# Patient Record
Sex: Female | Born: 1955
Health system: Southern US, Community
[De-identification: ages and names within clinical notes are randomized; demographics above are authoritative.]

## PROBLEM LIST (undated history)

## (undated) DIAGNOSIS — G4730 Sleep apnea, unspecified: Secondary | ICD-10-CM

## (undated) DIAGNOSIS — H269 Unspecified cataract: Secondary | ICD-10-CM

## (undated) DIAGNOSIS — E079 Disorder of thyroid, unspecified: Secondary | ICD-10-CM

## (undated) DIAGNOSIS — T7840XA Allergy, unspecified, initial encounter: Secondary | ICD-10-CM

## (undated) DIAGNOSIS — E119 Type 2 diabetes mellitus without complications: Secondary | ICD-10-CM

## (undated) DIAGNOSIS — K219 Gastro-esophageal reflux disease without esophagitis: Secondary | ICD-10-CM

## (undated) DIAGNOSIS — E785 Hyperlipidemia, unspecified: Secondary | ICD-10-CM

## (undated) DIAGNOSIS — I10 Essential (primary) hypertension: Secondary | ICD-10-CM

## (undated) DIAGNOSIS — M1990 Unspecified osteoarthritis, unspecified site: Secondary | ICD-10-CM

## (undated) HISTORY — DX: Hyperlipidemia, unspecified: E78.5

## (undated) HISTORY — PX: KNEE SURGERY: SHX244

## (undated) HISTORY — DX: Disorder of thyroid, unspecified: E07.9

## (undated) HISTORY — PX: TUBAL LIGATION: SHX77

## (undated) HISTORY — DX: Sleep apnea, unspecified: G47.30

## (undated) HISTORY — DX: Essential (primary) hypertension: I10

## (undated) HISTORY — DX: Allergy, unspecified, initial encounter: T78.40XA

## (undated) HISTORY — PX: CARPAL TUNNEL RELEASE: SHX101

## (undated) HISTORY — DX: Type 2 diabetes mellitus without complications: E11.9

## (undated) HISTORY — PX: EYE SURGERY: SHX253

## (undated) HISTORY — DX: Gastro-esophageal reflux disease without esophagitis: K21.9

## (undated) HISTORY — PX: BREAST SURGERY: SHX581

## (undated) HISTORY — DX: Unspecified cataract: H26.9

## (undated) HISTORY — DX: Unspecified osteoarthritis, unspecified site: M19.90

---

## 2012-09-29 ENCOUNTER — Ambulatory Visit (INDEPENDENT_AMBULATORY_CARE_PROVIDER_SITE_OTHER): Payer: BC Managed Care – PPO | Admitting: Family Medicine

## 2012-09-29 VITALS — BP 124/74 | HR 78 | Temp 97.8°F | Resp 18 | Ht 64.0 in | Wt 234.0 lb

## 2012-09-29 DIAGNOSIS — N926 Irregular menstruation, unspecified: Secondary | ICD-10-CM

## 2012-09-29 DIAGNOSIS — K219 Gastro-esophageal reflux disease without esophagitis: Secondary | ICD-10-CM

## 2012-09-29 DIAGNOSIS — M170 Bilateral primary osteoarthritis of knee: Secondary | ICD-10-CM

## 2012-09-29 DIAGNOSIS — F39 Unspecified mood [affective] disorder: Secondary | ICD-10-CM

## 2012-09-29 DIAGNOSIS — E785 Hyperlipidemia, unspecified: Secondary | ICD-10-CM

## 2012-09-29 DIAGNOSIS — E039 Hypothyroidism, unspecified: Secondary | ICD-10-CM

## 2012-09-29 DIAGNOSIS — I10 Essential (primary) hypertension: Secondary | ICD-10-CM

## 2012-09-29 DIAGNOSIS — E119 Type 2 diabetes mellitus without complications: Secondary | ICD-10-CM | POA: Insufficient documentation

## 2012-09-29 DIAGNOSIS — Z79899 Other long term (current) drug therapy: Secondary | ICD-10-CM | POA: Insufficient documentation

## 2012-09-29 LAB — FOLLICLE STIMULATING HORMONE: FSH: 15.7 m[IU]/mL

## 2012-09-29 LAB — TSH: TSH: 12.148 u[IU]/mL — ABNORMAL HIGH (ref 0.350–4.500)

## 2012-09-29 LAB — LIPID PANEL
Cholesterol: 269 mg/dL — ABNORMAL HIGH (ref 0–200)
HDL: 37 mg/dL — ABNORMAL LOW (ref >39)
Total CHOL/HDL Ratio: 7.3 Ratio

## 2012-09-29 LAB — COMPREHENSIVE METABOLIC PANEL
Albumin: 4.9 g/dL (ref 3.5–5.2)
Alkaline Phosphatase: 62 U/L (ref 39–117)
BUN: 22 mg/dL (ref 6–23)
CO2: 28 mEq/L (ref 19–32)
Glucose, Bld: 101 mg/dL — ABNORMAL HIGH (ref 70–99)
Potassium: 4.2 mEq/L (ref 3.5–5.3)
Total Bilirubin: 0.7 mg/dL (ref 0.3–1.2)

## 2012-09-29 LAB — POCT GLYCOSYLATED HEMOGLOBIN (HGB A1C): Hemoglobin A1C: 6.3

## 2012-09-29 MED ORDER — RANITIDINE HCL 150 MG PO CAPS: 150.0000 mg | ORAL_CAPSULE | Freq: Every evening | ORAL | Status: DC

## 2012-09-29 MED ORDER — SITAGLIPTIN PHOS-METFORMIN HCL 50-500 MG PO TABS: 1.0000 | ORAL_TABLET | Freq: Two times a day (BID) | ORAL | Status: DC

## 2012-09-29 MED ORDER — ESCITALOPRAM OXALATE 10 MG PO TABS: 10.0000 mg | ORAL_TABLET | Freq: Every day | ORAL | Status: DC

## 2012-09-29 MED ORDER — ATORVASTATIN CALCIUM 40 MG PO TABS: 40.0000 mg | ORAL_TABLET | Freq: Every day | ORAL | Status: DC

## 2012-09-29 MED ORDER — OLMESARTAN MEDOXOMIL-HCTZ 20-12.5 MG PO TABS: 1.0000 | ORAL_TABLET | Freq: Every day | ORAL | Status: DC

## 2012-09-29 MED ORDER — NAPROXEN 500 MG PO TABS: 500.0000 mg | ORAL_TABLET | Freq: Two times a day (BID) | ORAL | Status: DC

## 2012-09-29 MED ORDER — LEVOTHYROXINE SODIUM 150 MCG PO TABS: 150.0000 ug | ORAL_TABLET | Freq: Every day | ORAL | Status: DC

## 2012-09-29 NOTE — Progress Notes (Signed)
Subjective:    Patient ID: Jameshia Hayashida, female    DOB: 08/14/1956, 57 y.o.   MRN: [id number]  HPI  Ms. Dina is a pleseant 57 yo woman who is here to establish care for her chronic medical problems.  She moved to [place] about 5 mos prev from [place], [place] due to her job at [employer].  She is happy to be living in the area and has settled in nicely. She does work a lot - often 12 hr days, so finds it hard to incorporate a healthy lifestyle of diet and exercise. She is fasting today. Ms. Kantor has a h/o very well controlled DM. She does not check sugars very often but reports her last sev hgba1cs were 5.9 and 6.0.  Last saw eye doc 1 mo prev and her exam showed no signs of diabetic retinopathy.  Ms. Aydelotte does have chronic knee pain from bilateral arthritis and stiff joints in her hands for which she takes naproxyn bid and occ uses prn tramadol for a pinched nerve in her back which luckily does not flair to often. She does not take any herbal supplements but plans to start taking vitamins. Periods irreg due to uterine ablation sev yrs ago for menorrhagia. She wonders if she could be in menopause but difficult to tell since periods are so infrequent. She is not having any menopausal sxs but would expect to due to her age. Distant h/o abnml pap smear followed by intervention over 30 yrs ago. Has had nml pap smears yearly since - last done around her last birthday. Ms. Doughty has been taking her levothyroxine at the same time as the rest of her medication. Fell onto her L knee when she tripped 3 wks ago and still having severe pain with kneeling, no redness, bruising, or swelling initially or know but can't even kneel on her mattress still.  Not getting worse.  No pain with touching. No locking or giving out.  No trouble walking. Past Medical History  Diagnosis Date  . Allergy   . Arthritis   . GERD (gastroesophageal reflux disease)   . Cataract   . Diabetes mellitus without complication   .  Thyroid disease   . Hypertension    Past Surgical History  Procedure Date  . Breast surgery   . Eye surgery   . Tubal ligation   . Carpal tunnel release   . Knee surgery     left   Current Outpatient Prescriptions on File Prior to Visit  Medication Sig Dispense Refill  . atorvastatin (LIPITOR) 40 MG tablet Take 1 tablet (40 mg total) by mouth daily.  30 tablet  5  . escitalopram (LEXAPRO) 10 MG tablet Take 1 tablet (10 mg total) by mouth daily.  30 tablet  5  . levothyroxine (SYNTHROID, LEVOTHROID) 150 MCG tablet Take 1 tablet (150 mcg total) by mouth daily.  30 tablet  5  . olmesartan-hydrochlorothiazide (BENICAR HCT) 20-12.5 MG per tablet Take 1 tablet by mouth daily.  30 tablet  5  . ranitidine (ZANTAC) 150 MG capsule Take 1 capsule (150 mg total) by mouth every evening.  30 capsule  5  . sitaGLIPtan-metformin (JANUMET) 50-500 MG per tablet Take 1 tablet by mouth 2 (two) times daily with a meal.  60 tablet  5    Review of Systems  Constitutional: Positive for activity change. Negative for fever, chills and unexpected weight change.  Eyes: Negative for visual disturbance.  Respiratory: Negative for shortness of breath.   Cardiovascular:  Negative for chest pain and leg swelling.  Genitourinary: Negative for vaginal bleeding and menstrual problem.  Musculoskeletal: Positive for arthralgias and gait problem. Negative for myalgias, back pain and joint swelling.  Skin: Negative for color change and rash.  Neurological: Negative for weakness and numbness.      BP 124/74  Pulse 78  Temp 97.8 F (36.6 C) (Oral)  Resp 18  Ht 5\' 4"  (1.626 m)  Wt 234 lb (106.142 kg)  BMI 40.17 kg/m2  SpO2 95% Objective:   Physical Exam  Constitutional: She is oriented to person, place, and time. She appears well-developed and well-nourished. No distress.  HENT:  Head: Normocephalic and atraumatic.  Right Ear: External ear normal.  Left Ear: External ear normal.  Eyes: Conjunctivae normal are  normal. No scleral icterus.  Neck: Normal range of motion. Neck supple. No thyromegaly present.  Cardiovascular: Normal rate, regular rhythm, normal heart sounds and intact distal pulses.   Pulmonary/Chest: Effort normal and breath sounds normal. No respiratory distress.  Musculoskeletal: She exhibits no edema.       Right knee: She exhibits normal range of motion and no effusion.       Left knee: She exhibits normal range of motion, no effusion, no LCL laxity, normal patellar mobility and no MCL laxity. tenderness found. Lateral joint line tenderness noted. No medial joint line, no MCL, no LCL and no patellar tendon tenderness noted.       Bilateral crepitus  Lymphadenopathy:    She has no cervical adenopathy.  Neurological: She is alert and oriented to person, place, and time.  Skin: Skin is warm and dry. She is not diaphoretic. No erythema.  Psychiatric: She has a normal mood and affect. Her behavior is normal.      Results for orders placed in visit on 09/29/12  POCT GLYCOSYLATED HEMOGLOBIN (HGB A1C)      Component Value Range   Hemoglobin A1C 6.3     Assessment & Plan:   1. Diabetes mellitus - well controlled. Cont current reg. Microalbumin, urine, POCT glycosylated hemoglobin (Hb A1C), sitaGLIPtan-metformin (JANUMET) 50-500 MG per tablet  2. Encounter for long-term (current) use of other medications - ALT mildly elevated. Likely due to steatosis considering high LDL and trig level but could be from lipitor. Esp as lipitor is being increased, need to recheck alt in 6-8 wks. Comprehensive metabolic panel  3. Hyperlipidemia LDL goal < 100 - Not even near goal w/ LDL 160 and trig near 400. Should improve as levothyroxine dose/absorption is increased but will still increase lipitor from 40 to 80. Lipid panel, atorvastatin (LIPITOR) 80 MG tablet, DISCONTINUED: atorvastatin (LIPITOR) 40 MG tablet  4. Hypertension - well controlled. olmesartan-hydrochlorothiazide (BENICAR HCT) 20-12.5 MG per  tablet  5. Arthritis of both knees - cont ice and nsaids w/ prn tramadol for suspected contusion. If still having pain in 3-4 wks or any worsening or swelling, rec RTC for further eval. Could cons steroid inj but will try to hold off due to DM naproxen (NAPROSYN) 500 MG tablet  6. Irregular menses - from ablation but will check on menopausal status for pt. Luteinizing hormone, Follicle stimulating hormone  7. Hypothyroid - start taking levothyroxine 30 min before all other meds or food. TSH is above goal at 12 but hopefully will improve as med absorption is stabilized. Recheck TSH in 6-8 wks TSH, levothyroxine (SYNTHROID, LEVOTHROID) 150 MCG tablet  8. GERD (gastroesophageal reflux disease)  ranitidine (ZANTAC) 150 MG capsule  9. Mood disorder  escitalopram (LEXAPRO) 10 MG tablet  Rec start cal/vit D supp Need to get records from prior PCP, esp re: health maintanence.  Rec she sced CPE to review though likely will not need pap smear till 2016.

## 2012-09-30 LAB — MICROALBUMIN, URINE: Microalb, Ur: 0.83 mg/dL (ref 0.00–1.89)

## 2012-09-30 MED ORDER — ATORVASTATIN CALCIUM 80 MG PO TABS: 80.0000 mg | ORAL_TABLET | Freq: Every day | ORAL | Status: DC

## 2013-04-29 ENCOUNTER — Other Ambulatory Visit: Payer: Self-pay | Admitting: Family Medicine

## 2013-04-29 NOTE — Telephone Encounter (Signed)
Left message for pt to schedule appt with Dr. Clelia Croft w/in one month.

## 2013-04-29 NOTE — Telephone Encounter (Signed)
Please call patient with appt. Within 1 month with Dr Clelia Croft

## 2013-05-21 ENCOUNTER — Ambulatory Visit (INDEPENDENT_AMBULATORY_CARE_PROVIDER_SITE_OTHER): Payer: BC Managed Care – PPO | Admitting: Family Medicine

## 2013-05-21 ENCOUNTER — Encounter: Payer: Self-pay | Admitting: Family Medicine

## 2013-05-21 DIAGNOSIS — M19049 Primary osteoarthritis, unspecified hand: Secondary | ICD-10-CM | POA: Insufficient documentation

## 2013-05-21 DIAGNOSIS — E039 Hypothyroidism, unspecified: Secondary | ICD-10-CM

## 2013-05-21 DIAGNOSIS — E119 Type 2 diabetes mellitus without complications: Secondary | ICD-10-CM

## 2013-05-21 DIAGNOSIS — Z79899 Other long term (current) drug therapy: Secondary | ICD-10-CM

## 2013-05-21 DIAGNOSIS — M791 Myalgia, unspecified site: Secondary | ICD-10-CM

## 2013-05-21 DIAGNOSIS — K219 Gastro-esophageal reflux disease without esophagitis: Secondary | ICD-10-CM

## 2013-05-21 DIAGNOSIS — E785 Hyperlipidemia, unspecified: Secondary | ICD-10-CM

## 2013-05-21 DIAGNOSIS — IMO0002 Reserved for concepts with insufficient information to code with codable children: Secondary | ICD-10-CM

## 2013-05-21 DIAGNOSIS — I10 Essential (primary) hypertension: Secondary | ICD-10-CM

## 2013-05-21 LAB — LIPID PANEL
Cholesterol: 176 mg/dL (ref 0–200)
HDL: 35 mg/dL — ABNORMAL LOW (ref >39)
Total CHOL/HDL Ratio: 5 Ratio
Triglycerides: 262 mg/dL — ABNORMAL HIGH (ref <150)

## 2013-05-21 LAB — CK: Total CK: 160 U/L (ref 7–177)

## 2013-05-21 LAB — COMPREHENSIVE METABOLIC PANEL
BUN: 23 mg/dL (ref 6–23)
CO2: 24 mEq/L (ref 19–32)
Calcium: 9.8 mg/dL (ref 8.4–10.5)
Chloride: 102 mEq/L (ref 96–112)
Creat: 0.78 mg/dL (ref 0.50–1.10)
Total Bilirubin: 0.7 mg/dL (ref 0.3–1.2)

## 2013-05-21 MED ORDER — OLMESARTAN MEDOXOMIL-HCTZ 20-12.5 MG PO TABS: ORAL_TABLET | ORAL | Status: DC

## 2013-05-21 MED ORDER — MELOXICAM 15 MG PO TABS: 15.0000 mg | ORAL_TABLET | Freq: Every day | ORAL | Status: DC | PRN

## 2013-05-21 MED ORDER — ESCITALOPRAM OXALATE 10 MG PO TABS: ORAL_TABLET | ORAL | Status: DC

## 2013-05-21 MED ORDER — SITAGLIPTIN PHOS-METFORMIN HCL 50-500 MG PO TABS: ORAL_TABLET | ORAL | Status: DC

## 2013-05-21 NOTE — Patient Instructions (Addendum)
Do not use meloxicam with any other otc pain medication other than tylenol/acetaminophen - so no aleve, ibuprofen, motrin, advil, goody's, aspirin, etc.  When you are ready to consider cortisone injections, long-term splinting, or possibly surgery for your trigger fingers, let me know so we can refer you to a hand surgeon.  It does sound like you have a varient of restless legs. We are running some blood tests today to make sure nothing else looks like it could be contributing. Make sure you are getting plenty of electrolytes (potassium, magnesium) and are not dehydrated - drinking tons of water - which are likely to help. Also getting regular exercise should help immensely. If it starts bothering you more, we can either try a medication or get you to a neurologist for further evaluation.   Trigger Finger Trigger finger (digital tendinitis and stenosing tenosynovitis) is a common disorder that causes an often painful catching of the fingers or thumb. It occurs as a clicking, snapping or locking of a finger in the palm of the hand. The reason for this is that there is a problem with the tendons which flex the fingers sliding smoothly through their sheaths. The cause of this may be inflammation of the tendon and sheath, or from a thickening or nodule in the tendon. The condition may occur in any finger or a couple fingers at the same time. The cause may be overuse while doing the same activity over and over again with your hands.  Tendons are the tough cords that connect the muscles to bones. Muscles and tendons are part of the system which allows your body to move. When muscles contract in the forearm on the palm side, they pull the tendons toward the elbow and cause the fingers and thumb to bend (flex) toward the palm. These are the flexor tendons. The tendons slide through a slippery smooth membrane (synovium) which is called the tendon sheath. The sheaths have areas of tough fibrous tissues surrounding  them which hold the tendons close to the bone. These are called pulleys because they work like a pulley. The first pulley is in the palm of the hand near the crease which runs across your palm. If the area of the tendon thickening is near the pulley, the tendon cannot slide smoothly through the pulley and this causes the trigger finger. The finger may lock with the finger curled or suddenly straighten out with a snap. This is more common in patients with rheumatoid arthritis and diabetes. Left untreated, the condition may get worse to the point where the finger becomes locked in flexion, like making a fist, or less commonly locked with the finger straightened out. DIAGNOSIS  Your caregiver will easily make this diagnosis on examination. TREATMENT   Splinting for 6 to 8 weeks of time may be helpful. Use the splints as your caregiver suggests.  Heat used for twenty minutes at least four times a day followed by ice packs for twenty minutes unless directed otherwise by your caregiver may be helpful. If you find either heat or cold seems to be making the problem worse, quit using them and ask your caregiver for directions.  Cortisone injections along with splinting may speed up recovery. Several injections may be required. Cortisone may give relief after one injection.  Only take over-the-counter or prescription medicines for pain, discomfort, or fever as directed by your caregiver.  Surgery is another treatment that may be used if conservative treatments using injection and splinting does not work. Surgery can  be minor without incisions (a cut does not have to be made) and can be done with a needle through the skin. No stitches are needed and most patients may return to work the same day.  Other surgical choices involve an open procedure where the surgeon opens the hand through a small incision (cut) and cuts the pulley so the tendon can again slide smoothly. Your hand will still work fine. This small  operation requires stitches and the recovery will be a little longer and the incisions will need to be protected until completely healed. You may have to limit your activities for up to 6 months.  Occupational or hand therapy may be required if there is stiffness remaining in the finger. RISKS AND COMPLICATIONS Complications are uncommon but some problems that may occur are:  Recurrence of the trigger finger. This does not mean that the surgery was not well done. It simply means that you may have formed scar tissue following surgery that causes the problem to reoccur.  Infection which could ruin the results of the surgery and can result in a finger which is frozen and can not move normally.  Nerve injury is possible which could result in permanent numbness of one or more fingers. CARE AFTER SURGERY  Elevate your hand above your heart and use ice as instructed.  Follow instructions regarding finger motion/exercise.  Keep the surgical wound dry for at least 48 hrs or longer if instructed.  Keep your follow-up appointments.  Return to work and normal activities as instructed. SEEK IMMEDIATE MEDICAL CARE IF:  Your problems are getting worse or you do not obtain relief from the treatment. Document Released: 06/29/2004 Document Revised: 12/02/2011 Document Reviewed: 02/21/2009 [facility] Patient Information 2014 [place], [place].  Restless Legs Syndrome Restless legs syndrome is a movement disorder. It may also be called a sensori-motor disorder.  CAUSES  No one knows what specifically causes restless legs syndrome, but it tends to run in families. It is also more common in people with low iron, in pregnancy, in people who need dialysis, and those with nerve damage (neuropathy).Some medications may make restless legs syndrome worse.Those medications include drugs to treat high blood pressure, some heart conditions, nausea, colds, allergies, and depression. SYMPTOMS Symptoms include  uncomfortable sensations in the legs. These leg sensations are worse during periods of inactivity or rest. They are also worse while sitting or lying down. Individuals that have the disorder describe sensations in the legs that feel like:  Pulling.  Drawing.  Crawling.  Worming.  Boring.  Tingling.  Pins and needles.  Prickling.  Pain. The sensations are usually accompanied by an overwhelming urge to move the legs. Sudden muscle jerks may also occur. Movement provides temporary relief from the discomfort. In rare cases, the arms may also be affected. Symptoms may interfere with going to sleep (sleep onset insomnia). Restless legs syndrome may also be related to periodic limb movement disorder (PLMD). PLMD is another more common motor disorder. It also causes interrupted sleep. The symptoms from PLMD usually occur most often when you are awake. TREATMENT  Treatment for restless legs syndrome is symptomatic. This means that the symptoms are treated.   Massage and cold compresses may provide temporary relief.  Walk, stretch, or take a cold or hot bath.  Get regular exercise and a good night's sleep.  Avoid caffeine, alcohol, nicotine, and medications that can make it worse.  Do activities that provide mental stimulation like discussions, needlework, and video games. These may be helpful  if you are not able to walk or stretch. Some medications are effective in relieving the symptoms. However, many of these medications have side effects. Ask your caregiver about medications that may help your symptoms. Correcting iron deficiency may improve symptoms for some patients. Document Released: 08/30/2002 Document Revised: 12/02/2011 Document Reviewed: 12/06/2010 [facility] Patient Information 2014 [place], [place].

## 2013-05-21 NOTE — Progress Notes (Signed)
Subjective:    Patient ID: Sue Baker, female    DOB: 1955/09/26, 57 y.o.   MRN: [id number] Chief Complaint  Patient presents with  . Medication Refill    also needs RX for lancets and strips  . Trigger Finger  . Legs jumping   HPI  Not checking her cbgs as her meter broke several months ago.  Using her hands all the time working and gardening.  Using tramadol very rarely for severe arthritis pain.  However, fingers getting worse and catching all the time, she has to physically straighten.  Doing fine on the lexapro mood-wise.  Able to take the levothyroxine 30 min prior to the other meds.  We did not change her dose after her last elev TSH as she was able to change the timing of the med.  In January, increase lipitor from 40 to 80 daily.  Fasting today for recheck.  Has been going on for years but worsening in frequency.  Legs start aching bad when she is sitting so she has to move them but also feel like the twitch and jump on their own.  It is now happening every day.  Walking or laying down actually helps them feel better.    Past Medical History  Diagnosis Date  . Allergy   . Arthritis   . GERD (gastroesophageal reflux disease)   . Cataract   . Diabetes mellitus without complication   . Thyroid disease   . Hypertension    No Known Allergies   Current Outpatient Prescriptions on File Prior to Visit  Medication Sig Dispense Refill  . aspirin 81 MG tablet Take 81 mg by mouth daily.      Marland Kitchen atorvastatin (LIPITOR) 80 MG tablet Take 1 tablet (80 mg total) by mouth daily.  30 tablet  5  . BENICAR HCT 20-12.5 MG per tablet TAKE 1 TABLET BY MOUTH DAILY.  30 tablet  0  . escitalopram (LEXAPRO) 10 MG tablet TAKE 1 TABLET (10 MG TOTAL) BY MOUTH DAILY.  30 tablet  0  . JANUMET 50-500 MG per tablet TAKE 1 TABLET BY MOUTH 2 (TWO) TIMES DAILY WITH A MEAL.  60 tablet  0  . levothyroxine (SYNTHROID, LEVOTHROID) 150 MCG tablet TAKE 1 TABLET (150 MCG TOTAL) BY MOUTH DAILY.  30 tablet  0    No current facility-administered medications on file prior to visit.     Review of Systems  Constitutional: Positive for fatigue. Negative for fever, chills, diaphoresis, appetite change and unexpected weight change.  Eyes: Negative for visual disturbance.  Respiratory: Negative for cough and shortness of breath.   Cardiovascular: Negative for chest pain, palpitations and leg swelling.  Genitourinary: Negative for decreased urine volume.  Musculoskeletal: Positive for myalgias and arthralgias. Negative for gait problem.  Neurological: Negative for syncope and headaches.  Hematological: Does not bruise/bleed easily.      BP 110/64  Pulse 60  Temp(Src) 97.9 F (36.6 C) (Oral)  Resp 16  Ht 5\' 4"  (1.626 m)  Wt 233 lb (105.688 kg)  BMI 39.97 kg/m2  SpO2 97% Objective:   Physical Exam  Constitutional: She is oriented to person, place, and time. She appears well-developed and well-nourished. No distress.  HENT:  Head: Normocephalic and atraumatic.  Right Ear: External ear normal.  Left Ear: External ear normal.  Eyes: Conjunctivae are normal. No scleral icterus.  Neck: Normal range of motion. Neck supple. No thyromegaly present.  Cardiovascular: Normal rate, regular rhythm, normal heart sounds and intact distal pulses.  Pulmonary/Chest: Effort normal and breath sounds normal. No respiratory distress.  Musculoskeletal: She exhibits no edema.  Lymphadenopathy:    She has no cervical adenopathy.  Neurological: She is alert and oriented to person, place, and time. She has normal strength. She displays no atrophy. No sensory deficit. She exhibits normal muscle tone. Gait normal.  Reflex Scores:      Patellar reflexes are 2+ on the right side and 2+ on the left side. Skin: Skin is warm and dry. She is not diaphoretic. No erythema.  Psychiatric: She has a normal mood and affect. Her behavior is normal.      Results for orders placed in visit on 05/21/13  GLUCOSE, POCT (MANUAL  RESULT ENTRY)      Result Value Range   POC Glucose 123 (*) 70 - 99 mg/dl  POCT GLYCOSYLATED HEMOGLOBIN (HGB A1C)      Result Value Range   Hemoglobin A1C 6.8      Assessment & Plan:  Osteoarthritis of hand and hip, unspecified laterality - ok to cont prn meloxicam, if trigger fingers worsen, cons referral to hand surg to cons injections.  Hypothyroid - Plan: TSH - refill after labs return  Hypertension - Plan: Comprehensive metabolic panel, CK - well controlled.  Hyperlipidemia LDL goal < 100 - Plan: Lipid panel - refill after labs return.  GERD (gastroesophageal reflux disease) - using otc nexium  Encounter for long-term (current) use of other medications  Diabetes mellitus - Plan: POCT glucose (manual entry), POCT glycosylated hemoglobin (Hb A1C) - a1c increasing - pt planning to work on diet/exercise - recheck in 4 mos and cons increasing metformin at that time if not sig improved.  Myalgia - Plan: Magnesium, Sedimentation rate - sounds like RLS other than that sxs do not occur at night. Could do trial of RLS med or refer to neurology for further eval but will hold off for now - pt wants to wait till sxs worsen.  Meds ordered this encounter  Medications  . meloxicam (MOBIC) 15 MG tablet    Sig: Take 1 tablet (15 mg total) by mouth daily as needed for pain.    Dispense:  30 tablet    Refill:  5  . sitaGLIPtin-metformin (JANUMET) 50-500 MG per tablet    Sig: TAKE 1 TABLET BY MOUTH 2 (TWO) TIMES DAILY WITH A MEAL.    Dispense:  180 tablet    Refill:  1  . escitalopram (LEXAPRO) 10 MG tablet    Sig: TAKE 1 TABLET (10 MG TOTAL) BY MOUTH DAILY.    Dispense:  90 tablet    Refill:  1  . olmesartan-hydrochlorothiazide (BENICAR HCT) 20-12.5 MG per tablet    Sig: TAKE 1 TABLET BY MOUTH DAILY.    Dispense:  90 tablet    Refill:  1

## 2013-05-22 ENCOUNTER — Other Ambulatory Visit: Payer: Self-pay | Admitting: *Deleted

## 2013-05-22 ENCOUNTER — Encounter: Payer: Self-pay | Admitting: Family Medicine

## 2013-05-22 MED ORDER — ATORVASTATIN CALCIUM 80 MG PO TABS: 80.0000 mg | ORAL_TABLET | Freq: Every day | ORAL | Status: DC

## 2013-05-22 MED ORDER — ATORVASTATIN CALCIUM 40 MG PO TABS: 80.0000 mg | ORAL_TABLET | Freq: Every day | ORAL | Status: DC

## 2013-05-22 MED ORDER — LEVOTHYROXINE SODIUM 150 MCG PO TABS: ORAL_TABLET | ORAL | Status: DC

## 2013-05-22 MED ORDER — GEMFIBROZIL 600 MG PO TABS: 600.0000 mg | ORAL_TABLET | Freq: Two times a day (BID) | ORAL | Status: DC

## 2013-05-22 NOTE — Addendum Note (Signed)
Addended by: Norberto Sorenson on: 05/22/2013 11:54 AM   Modules accepted: Orders

## 2013-06-02 ENCOUNTER — Other Ambulatory Visit: Payer: Self-pay | Admitting: Family Medicine

## 2013-06-02 MED ORDER — ATORVASTATIN CALCIUM 40 MG PO TABS: 40.0000 mg | ORAL_TABLET | Freq: Every day | ORAL | Status: DC

## 2013-06-02 MED ORDER — FENOFIBRATE 145 MG PO TABS: 145.0000 mg | ORAL_TABLET | Freq: Every day | ORAL | Status: DC

## 2013-06-02 NOTE — Progress Notes (Signed)
LDL improved but triglycerides still way to high on atorvastatin 80 so change to atorvastatin 40 and start fenofibrate 145. Recheck liver in 1-2 months and consider RUQ [place] if LFTs still abnormal.

## 2014-01-20 ENCOUNTER — Ambulatory Visit (INDEPENDENT_AMBULATORY_CARE_PROVIDER_SITE_OTHER): Payer: 59 | Admitting: Emergency Medicine

## 2014-01-20 VITALS — BP 110/70 | HR 60 | Temp 97.7°F | Resp 16 | Ht 65.0 in | Wt 236.0 lb

## 2014-01-20 DIAGNOSIS — E119 Type 2 diabetes mellitus without complications: Secondary | ICD-10-CM

## 2014-01-20 DIAGNOSIS — E039 Hypothyroidism, unspecified: Secondary | ICD-10-CM

## 2014-01-20 DIAGNOSIS — E785 Hyperlipidemia, unspecified: Secondary | ICD-10-CM

## 2014-01-20 DIAGNOSIS — I10 Essential (primary) hypertension: Secondary | ICD-10-CM

## 2014-01-20 LAB — POCT CBC
GRANULOCYTE PERCENT: 46.4 % (ref 37–80)
HEMATOCRIT: 41.2 % (ref 37.7–47.9)
HEMOGLOBIN: 13 g/dL (ref 12.2–16.2)
Lymph, poc: 3.2 (ref 0.6–3.4)
MCH, POC: 31.2 pg (ref 27–31.2)
MCHC: 31.6 g/dL — AB (ref 31.8–35.4)
MCV: 98.8 fL — AB (ref 80–97)
MID (cbc): 0.5 (ref 0–0.9)
MPV: 10.3 fL (ref 0–99.8)
PLATELET COUNT, POC: 266 10*3/uL (ref 142–424)
POC Granulocyte: 3.2 (ref 2–6.9)
POC LYMPH PERCENT: 45.7 %L (ref 10–50)
POC MID %: 7.9 %M (ref 0–12)
RBC: 4.17 M/uL (ref 4.04–5.48)
RDW, POC: 13 %
WBC: 6.9 10*3/uL (ref 4.6–10.2)

## 2014-01-20 LAB — COMPREHENSIVE METABOLIC PANEL
ALBUMIN: 4.2 g/dL (ref 3.5–5.2)
ALT: 35 U/L (ref 0–35)
AST: 29 U/L (ref 0–37)
Alkaline Phosphatase: 69 U/L (ref 39–117)
BILIRUBIN TOTAL: 0.7 mg/dL (ref 0.2–1.2)
BUN: 14 mg/dL (ref 6–23)
CALCIUM: 9.3 mg/dL (ref 8.4–10.5)
CO2: 27 meq/L (ref 19–32)
Chloride: 104 mEq/L (ref 96–112)
Creat: 0.73 mg/dL (ref 0.50–1.10)
GLUCOSE: 131 mg/dL — AB (ref 70–99)
Potassium: 4.4 mEq/L (ref 3.5–5.3)
SODIUM: 139 meq/L (ref 135–145)
TOTAL PROTEIN: 6.8 g/dL (ref 6.0–8.3)

## 2014-01-20 LAB — LIPID PANEL
CHOLESTEROL: 185 mg/dL (ref 0–200)
HDL: 42 mg/dL (ref >39)
LDL Cholesterol: 105 mg/dL — ABNORMAL HIGH (ref 0–99)
TRIGLYCERIDES: 192 mg/dL — AB (ref <150)
Total CHOL/HDL Ratio: 4.4 Ratio
VLDL: 38 mg/dL (ref 0–40)

## 2014-01-20 LAB — POCT UA - MICROSCOPIC ONLY
CRYSTALS, UR, HPF, POC: NEGATIVE
Casts, Ur, LPF, POC: NEGATIVE
Mucus, UA: POSITIVE
Yeast, UA: NEGATIVE

## 2014-01-20 LAB — POCT URINALYSIS DIPSTICK
Bilirubin, UA: NEGATIVE
Blood, UA: NEGATIVE
Glucose, UA: NEGATIVE
KETONES UA: NEGATIVE
Nitrite, UA: NEGATIVE
PROTEIN UA: NEGATIVE
SPEC GRAV UA: 1.025
Urobilinogen, UA: 0.2
pH, UA: 5

## 2014-01-20 LAB — TSH: TSH: 4.813 u[IU]/mL — ABNORMAL HIGH (ref 0.350–4.500)

## 2014-01-20 MED ORDER — ESCITALOPRAM OXALATE 10 MG PO TABS: ORAL_TABLET | ORAL | Status: DC

## 2014-01-20 MED ORDER — SITAGLIPTIN PHOS-METFORMIN HCL 50-500 MG PO TABS: ORAL_TABLET | ORAL | Status: DC

## 2014-01-20 MED ORDER — MELOXICAM 15 MG PO TABS: 15.0000 mg | ORAL_TABLET | Freq: Every day | ORAL | Status: DC | PRN

## 2014-01-20 MED ORDER — OLMESARTAN MEDOXOMIL-HCTZ 20-12.5 MG PO TABS: ORAL_TABLET | ORAL | Status: DC

## 2014-01-20 MED ORDER — FENOFIBRATE 145 MG PO TABS: 145.0000 mg | ORAL_TABLET | Freq: Every day | ORAL | Status: DC

## 2014-01-20 NOTE — Progress Notes (Addendum)
[facility] [address] [place] [address] [phone]  Date:  01/20/2014   Name:  Sue Baker   DOB:  [birth year]   MRN:  [id number]  PCP:  Delman Cheadle, MD    Chief Complaint: Medication Refill   History of Present Illness:  Sue Baker is a 58 y.o. very pleasant female patient who presents with the following:  History of hypertension, hyperlipidemia and NIDDM.  Tolerating medications with no adverse effects.  Non smoker.  No improvement with over the counter medications or other home remedies. Denies other complaint or health concern today.   Patient Active Problem List   Diagnosis Date Noted  . Osteoarthritis of hand 05/21/2013  . Diabetes mellitus 09/29/2012  . Encounter for long-term (current) use of other medications 09/29/2012  . Hyperlipidemia LDL goal < 100 09/29/2012  . Hypertension 09/29/2012  . Arthritis of both knees 09/29/2012  . Irregular menses 09/29/2012  . Hypothyroid 09/29/2012  . GERD (gastroesophageal reflux disease) 09/29/2012  . Mood disorder 09/29/2012    Past Medical History  Diagnosis Date  . Allergy   . Arthritis   . GERD (gastroesophageal reflux disease)   . Cataract   . Diabetes mellitus without complication   . Thyroid disease   . Hypertension     Past Surgical History  Procedure Laterality Date  . Breast surgery    . Eye surgery    . Tubal ligation    . Carpal tunnel release    . Knee surgery      left    History  Substance Use Topics  . Smoking status: Former Smoker    Quit date: 09/30/1999  . Smokeless tobacco: Not on file  . Alcohol Use: Yes    Family History  Problem Relation Age of Onset  . Heart disease Father     No Known Allergies  Medication list has been reviewed and updated.  Current Outpatient Prescriptions on File Prior to Visit  Medication Sig Dispense Refill  . aspirin 81 MG tablet Take 81 mg by mouth daily.      Marland Kitchen atorvastatin (LIPITOR) 40 MG tablet Take 1 tablet (40 mg  total) by mouth daily.  90 tablet  1  . escitalopram (LEXAPRO) 10 MG tablet TAKE 1 TABLET (10 MG TOTAL) BY MOUTH DAILY.  90 tablet  1  . levothyroxine (SYNTHROID, LEVOTHROID) 150 MCG tablet TAKE 1 TABLET (150 MCG TOTAL) BY MOUTH DAILY.  90 tablet  1  . meloxicam (MOBIC) 15 MG tablet Take 1 tablet (15 mg total) by mouth daily as needed for pain.  30 tablet  5  . olmesartan-hydrochlorothiazide (BENICAR HCT) 20-12.5 MG per tablet TAKE 1 TABLET BY MOUTH DAILY.  90 tablet  1  . sitaGLIPtin-metformin (JANUMET) 50-500 MG per tablet TAKE 1 TABLET BY MOUTH 2 (TWO) TIMES DAILY WITH A MEAL.  180 tablet  1  . fenofibrate (TRICOR) 145 MG tablet Take 1 tablet (145 mg total) by mouth daily.  90 tablet  0   No current facility-administered medications on file prior to visit.    Review of Systems:  As per HPI, otherwise negative.    Physical Examination: Filed Vitals:   01/20/14 0849  BP: 110/70  Pulse: 60  Temp: 97.7 F (36.5 C)  Resp: 16   Filed Vitals:   01/20/14 0849  Height: 5\' 5"  (1.651 m)  Weight: 236 lb (107.049 kg)   Body mass index is 39.27 kg/(m^2). Ideal Body Weight: Weight in (lb) to have  BMI = 25: 149.9  GEN: WDWN, NAD, Non-toxic, A & O x 3 HEENT: Atraumatic, Normocephalic. Neck supple. No masses, No LAD. Ears and Nose: No external deformity. CV: RRR, No M/G/R. No JVD. No thrill. No extra heart sounds. PULM: CTA B, no wheezes, crackles, rhonchi. No retractions. No resp. distress. No accessory muscle use. ABD: S, NT, ND, +BS. No rebound. No HSM. EXTR: No c/c/e NEURO Normal gait.  PSYCH: Normally interactive. Conversant. Not depressed or anxious appearing.  Calm demeanor.    Assessment and Plan: Hypertension HLD NIDDM Hypothyroid  Signed,  Ellison Carwin, MD   Results for orders placed in visit on 01/20/14  COMPREHENSIVE METABOLIC PANEL      Result Value Ref Range   Sodium 139  135 - 145 mEq/L   Potassium 4.4  3.5 - 5.3 mEq/L   Chloride 104  96 - 112 mEq/L    CO2 27  19 - 32 mEq/L   Glucose, Bld 131 (*) 70 - 99 mg/dL   BUN 14  6 - 23 mg/dL   Creat 0.73  0.50 - 1.10 mg/dL   Total Bilirubin 0.7  0.2 - 1.2 mg/dL   Alkaline Phosphatase 69  39 - 117 U/L   AST 29  0 - 37 U/L   ALT 35  0 - 35 U/L   Total Protein 6.8  6.0 - 8.3 g/dL   Albumin 4.2  3.5 - 5.2 g/dL   Calcium 9.3  8.4 - 10.5 mg/dL  LIPID PANEL      Result Value Ref Range   Cholesterol 185  0 - 200 mg/dL   Triglycerides 192 (*) <150 mg/dL   HDL 42  >39 mg/dL   Total CHOL/HDL Ratio 4.4     VLDL 38  0 - 40 mg/dL   LDL Cholesterol 105 (*) 0 - 99 mg/dL  TSH      Result Value Ref Range   TSH 4.813 (*) 0.350 - 4.500 uIU/mL  POCT CBC      Result Value Ref Range   WBC 6.9  4.6 - 10.2 K/uL   Lymph, poc 3.2  0.6 - 3.4   POC LYMPH PERCENT 45.7  10 - 50 %L   MID (cbc) 0.5  0 - 0.9   POC MID % 7.9  0 - 12 %M   POC Granulocyte 3.2  2 - 6.9   Granulocyte percent 46.4  37 - 80 %G   RBC 4.17  4.04 - 5.48 M/uL   Hemoglobin 13.0  12.2 - 16.2 g/dL   HCT, POC 41.2  37.7 - 47.9 %   MCV 98.8 (*) 80 - 97 fL   MCH, POC 31.2  27 - 31.2 pg   MCHC 31.6 (*) 31.8 - 35.4 g/dL   RDW, POC 13.0     Platelet Count, POC 266  142 - 424 K/uL   MPV 10.3  0 - 99.8 fL  POCT UA - MICROSCOPIC ONLY      Result Value Ref Range   WBC, Ur, HPF, POC 5-10     RBC, urine, microscopic 1-3     Bacteria, U Microscopic 2+     Mucus, UA POSITIVE     Epithelial cells, urine per micros 6-10     Crystals, Ur, HPF, POC NEG     Casts, Ur, LPF, POC NEG     Yeast, UA NEG    POCT URINALYSIS DIPSTICK      Result Value Ref Range   Color, UA YELLOW  Clarity, UA CLEAR     Glucose, UA NEG     Bilirubin, UA NEG     Ketones, UA NEG     Spec Grav, UA 1.025     Blood, UA NEG     pH, UA 5.0     Protein, UA NEG     Urobilinogen, UA 0.2     Nitrite, UA NEG     Leukocytes, UA small (1+)

## 2014-01-20 NOTE — Patient Instructions (Signed)

## 2014-01-21 MED ORDER — LEVOTHYROXINE SODIUM 175 MCG PO TABS: ORAL_TABLET | ORAL | Status: DC

## 2014-01-21 NOTE — Addendum Note (Signed)
Addended by: Roselee Culver on: 01/21/2014 10:17 AM   Modules accepted: Orders

## 2014-02-08 ENCOUNTER — Ambulatory Visit (INDEPENDENT_AMBULATORY_CARE_PROVIDER_SITE_OTHER): Payer: 59 | Admitting: Cardiovascular Disease

## 2014-02-08 ENCOUNTER — Encounter: Payer: Self-pay | Admitting: Cardiovascular Disease

## 2014-02-08 VITALS — BP 156/74 | HR 70 | Ht 64.0 in | Wt 235.6 lb

## 2014-02-08 DIAGNOSIS — I10 Essential (primary) hypertension: Secondary | ICD-10-CM

## 2014-02-08 DIAGNOSIS — E785 Hyperlipidemia, unspecified: Secondary | ICD-10-CM

## 2014-02-08 DIAGNOSIS — F3289 Other specified depressive episodes: Secondary | ICD-10-CM

## 2014-02-08 DIAGNOSIS — G4733 Obstructive sleep apnea (adult) (pediatric): Secondary | ICD-10-CM

## 2014-02-08 DIAGNOSIS — Z9989 Dependence on other enabling machines and devices: Secondary | ICD-10-CM

## 2014-02-08 DIAGNOSIS — E039 Hypothyroidism, unspecified: Secondary | ICD-10-CM

## 2014-02-08 DIAGNOSIS — F32A Depression, unspecified: Secondary | ICD-10-CM | POA: Insufficient documentation

## 2014-02-08 DIAGNOSIS — E119 Type 2 diabetes mellitus without complications: Secondary | ICD-10-CM

## 2014-02-08 DIAGNOSIS — F329 Major depressive disorder, single episode, unspecified: Secondary | ICD-10-CM

## 2014-02-08 NOTE — Progress Notes (Signed)
Patient ID: Sue Baker, female   DOB: 12-Jun-1956, 58 y.o.   MRN: [id number]     HPI: Sue Baker is a 58 y.o. female who is self-referred and presents to the office for evaluation of sleep apnea as well as cardiology evaluation.  Sue Baker is a 58 year old female, who is originally from [place].  Approximately 2 years ago.  She moved to the [place] area when her job with [employer] was transferred to [place].  She admits to a long-standing history of hyperlipidemia, hypertension, diabetes mellitus, hypothyroidism, depression, as well as obstructive sleep apnea.  She was diagnosed with having obstructive sleep apnea approximately 15 years ago and at that time recalls being told that she stopped breathing.  Approximately one time per minute.  This would equate to an AHI of approximately 60.  She has been on all CPAP unit since that time.  The past 2 weeks.  Her machine is broken.  It is nonfunctional.  She has not been able to sleep without her CPAP.  Her sleep quality since not being on it is poor.  She does snore excessively.  She wakes up more frequently.  Her sleep is nonrestorative.  She apparently investigated me through a Internet search, and presents to establish cardiologic as well as care for obstructive sleep apnea.  She recently had laboratory work done by her primary physician, Dr. Brigitte Pulse.  Note, fasting glucose was 131.  She had normal renal function and LFTs.  A hemoglobin A1c was not done.  Lipid panel was abnormal and represents an atherogenic dyslipidemia profile with a total cholesterol of 185, triglycerides 182, LDL 105, an HDL cholesterol 42.  Her VLDL cholesterol was 38.  She had been on atorvastatin 40 mg apparently she recently had been called in a prescription for fetal fibrin 145 mg, which she has not yet started.  She has a history of hypertension and has been on Benicar HCT 20/12.5 for this.  She states her blood pressure typically has been fairly well controlled.   However, since she is no longer been able to use CPAP.  Her blood pressure is tending to trend upward.  She has a history of hypothyroidism and is on Synthroid at 175 mcg.  She has a history of diabetes mellitus, currently on gentleman to 50/500.  She has a history of depression, for which he takes Lexapro 10 mg.  An Epworth sleepiness scale score was calculated today as noted below, and this endorsed to 12, compatible with excessive daytime sleepiness.   Epworth Sleepiness Scale: Situation   Chance of Dozing/Sleeping (0 = never , 1 = slight chance , 2 = moderate chance , 3 = high chance )   sitting and reading 1   watching TV 1   sitting inactive in a public place 3   being a passenger in a motor vehicle for an hour or more 3   lying down in the afternoon 3   sitting and talking to someone 0   sitting quietly after lunch (no alcohol) 1   while stopped for a few minutes in traffic as the driver 0   Total Score  12    Past Medical History  Diagnosis Date  . Allergy   . Arthritis   . GERD (gastroesophageal reflux disease)   . Cataract   . Diabetes mellitus without complication   . Thyroid disease   . Hypertension     Past Surgical History  Procedure Laterality Date  . Breast surgery    .  Eye surgery    . Tubal ligation    . Carpal tunnel release    . Knee surgery      left    No Known Allergies  Current Outpatient Prescriptions  Medication Sig Dispense Refill  . aspirin 81 MG tablet Take 81 mg by mouth daily.      Marland Kitchen atorvastatin (LIPITOR) 40 MG tablet Take 1 tablet (40 mg total) by mouth daily.  90 tablet  1  . escitalopram (LEXAPRO) 10 MG tablet TAKE 1 TABLET (10 MG TOTAL) BY MOUTH DAILY.  90 tablet  1  . levothyroxine (SYNTHROID, LEVOTHROID) 175 MCG tablet TAKE 1 TABLET (150 MCG TOTAL) BY MOUTH DAILY.  90 tablet  1  . meloxicam (MOBIC) 15 MG tablet Take 1 tablet (15 mg total) by mouth daily as needed for pain.  90 tablet  1  . olmesartan-hydrochlorothiazide  (BENICAR HCT) 20-12.5 MG per tablet TAKE 1 TABLET BY MOUTH DAILY.  90 tablet  1  . sitaGLIPtin-metformin (JANUMET) 50-500 MG per tablet TAKE 1 TABLET BY MOUTH 2 (TWO) TIMES DAILY WITH A MEAL.  180 tablet  1   No current facility-administered medications for this visit.    History   Social History  . Marital Status: Single    Spouse Name: N/A    Number of Children: N/A  . Years of Education: N/A   Occupational History  . Not on file.   Social History Main Topics  . Smoking status: Former Smoker    Quit date: 09/30/1999  . Smokeless tobacco: Never Used  . Alcohol Use: Yes     Comment: rarely  . Drug Use: No  . Sexual Activity: No   Other Topics Concern  . Not on file   Social History Narrative  . No narrative on file    Socially, she is divorced.  She completed 12th grade education.  She 2 children, and 4 grandchildren.  She currently is living with one child and one grandchild.  She quit tobacco in 2001.  She does drink occasional wine.    ROS General: Positive for obesity No fevers, chills, or night sweats HEENT: History of prior bilateral cataract surgery, status post lens implants; No changes in vision or hearing, sinus congestion, difficulty swallowing Pulmonary: Negative; No cough, wheezing, shortness of breath, hemoptysis Cardiovascular: Negative; No chest pain, presyncope, syncope, palpatations GI: Negative; No nausea, vomiting, diarrhea, or abdominal pain GU: Negative; No dysuria, hematuria, or difficulty voiding Musculoskeletal: Negative; no myalgias, joint pain, or weakness Hematologic: Negative; no easy bruising, bleeding Endocrine: Positive for hypothyroidism, as well as diabetes mellitus, type II Neuro: Negative; no changes in balance, headaches Skin: Negative; No rashes or skin lesions Psychiatric: Positive for depression No behavioral problems,  Sleep: Positive for obstructive sleep apnea.  Since her CPAP machine is broken she does experience daytime  sleepiness, hypersomnolence; she is unaware of bruxism, restless legs, hypnogognic hallucinations, no cataplexy   Physical Exam BP 156/74  Pulse 70  Ht 5\' 4"  (1.626 m)  Wt 235 lb 9.6 oz (106.867 kg)  BMI 40.42 kg/m2  Repeat blood pressure was 138/74 General: Alert, oriented, no distress.  Morbidly obese Skin: normal turgor, no rashes HEENT: Normocephalic, atraumatic. Pupils round and reactive; sclera anicteric; extraocular muscles intact; Fundi bilateral intraocular lens implants. Nose without nasal septal hypertrophy Mouth/Parynx benign; Mallinpatti scale 4 Neck: No JVD, no carotid briuts Lungs: clear to ausculatation and percussion; no wheezing or rales  Chest wall: No tenderness to palpation; she is status post breast  reduction Heart: RRR, s1 s2 normal 1/6 systolic murmur.  No S3 or S4 gallop.  No diastolic murmur. Abdomen: soft, nontender; no hepatosplenomehaly, BS+; abdominal aorta nontender and not dilated by palpation. Back: No CVA tenderness Pulses 2+ Extremities: no clubbinbg cyanosis or edema, Homan's sign negative  Neurologic: grossly nonfocal; cranial nerves intact. Psychological: Normal affect and mood.  ECG (independently read by me): Normal sinus rhythm at 70 beats per minute.  Nonspecific ST changes.  LABS:  BMET    Component Value Date/Time   NA 139 01/20/2014 0932   K 4.4 01/20/2014 0932   CL 104 01/20/2014 0932   CO2 27 01/20/2014 0932   GLUCOSE 131* 01/20/2014 0932   BUN 14 01/20/2014 0932   CREATININE 0.73 01/20/2014 0932   CALCIUM 9.3 01/20/2014 0932     Hepatic Function Panel     Component Value Date/Time   PROT 6.8 01/20/2014 0932   ALBUMIN 4.2 01/20/2014 0932   AST 29 01/20/2014 0932   ALT 35 01/20/2014 0932   ALKPHOS 69 01/20/2014 0932   BILITOT 0.7 01/20/2014 0932     CBC    Component Value Date/Time   WBC 6.9 01/20/2014 0936   RBC 4.17 01/20/2014 0936   HGB 13.0 01/20/2014 0936   HCT 41.2 01/20/2014 0936   MCV 98.8* 01/20/2014 0936   MCH 31.2  01/20/2014 0936   MCHC 31.6* 01/20/2014 0936     BNP No results found for this basename: probnp    Lipid Panel     Component Value Date/Time   CHOL 185 01/20/2014 0932   TRIG 192* 01/20/2014 0932   HDL 42 01/20/2014 0932   CHOLHDL 4.4 01/20/2014 0932   VLDL 38 01/20/2014 0932   LDLCALC 105* 01/20/2014 0932     RADIOLOGY: No results found.    ASSESSMENT AND PLAN: Ms. Markel Mergenthaler is a 58 year old female, who is self-referred after her CPAP machine has recently broken.  She has a 15 year history of obstructive sleep apnea, which by clinical history sounds severe.  Additional cardiovascular comorbidities  include hypertension, diabetes mellitus, hyperlipidemia, and she also has a history of hypothyroidism, as well as depression.  Presently, her blood pressure was elevated when initially taken by the nurse but this has improved on subsequent blood pressure recordings.  I suspect her recent blood pressure elevation of the past several weeks has also been contributed by her inability to use her CPAP therapy.  It has been approximately 15 years since her initial CPAP study was done.  I will refer her to [facility] for a split-night protocol, if possible, and she will then need to be prescribed a new CPAP unit and mask.  I discussed with her the significant improvements in technology in both CPAP units, as well as masks.  I did review the blood work taken by Dr. Brigitte Pulse.  Target LDL is less than 70 in this diabetic female.  She does have an atherogenic dyslipidemia profile, and I suspected, an MR testing was done.  She would have significantly increased LDL particle number.  We discussed the importance of weight reduction.  I will see her back in the office in 2 months in followup of her sleep study and reinitiation of CPAP therapy, and additional recommendations will be made at that time.    Troy Sine, MD, [facility]  02/08/2014 4:38 PM

## 2014-02-08 NOTE — Patient Instructions (Signed)
Your physician has recommended that you have a sleep study. This test records several body functions during sleep, including: brain activity, eye movement, oxygen and carbon dioxide blood levels, heart rate and rhythm, breathing rate and rhythm, the flow of air through your mouth and nose, snoring, body muscle movements, and chest and belly movement.  Your physician recommends that you schedule a follow-up appointment in: 2 months  

## 2014-02-09 ENCOUNTER — Ambulatory Visit (INDEPENDENT_AMBULATORY_CARE_PROVIDER_SITE_OTHER): Payer: 59 | Admitting: Physician Assistant

## 2014-02-09 ENCOUNTER — Telehealth: Payer: Self-pay | Admitting: Physician Assistant

## 2014-02-09 ENCOUNTER — Ambulatory Visit: Payer: 59

## 2014-02-09 VITALS — BP 130/72 | HR 70 | Temp 97.9°F | Resp 18 | Ht 64.0 in | Wt 236.0 lb

## 2014-02-09 DIAGNOSIS — M545 Low back pain, unspecified: Secondary | ICD-10-CM

## 2014-02-09 DIAGNOSIS — M5430 Sciatica, unspecified side: Secondary | ICD-10-CM

## 2014-02-09 DIAGNOSIS — M549 Dorsalgia, unspecified: Secondary | ICD-10-CM

## 2014-02-09 LAB — POCT UA - MICROSCOPIC ONLY
Bacteria, U Microscopic: NEGATIVE
Casts, Ur, LPF, POC: NEGATIVE
Crystals, Ur, HPF, POC: NEGATIVE
Yeast, UA: NEGATIVE

## 2014-02-09 LAB — POCT URINALYSIS DIPSTICK
Bilirubin, UA: NEGATIVE
Glucose, UA: 500
Ketones, UA: NEGATIVE
Nitrite, UA: NEGATIVE
Protein, UA: NEGATIVE
Spec Grav, UA: >=1.03
Urobilinogen, UA: 4
pH, UA: 5.5

## 2014-02-09 MED ORDER — TRAMADOL HCL 50 MG PO TABS: 50.0000 mg | ORAL_TABLET | Freq: Three times a day (TID) | ORAL | Status: DC | PRN

## 2014-02-09 MED ORDER — CYCLOBENZAPRINE HCL 10 MG PO TABS: 5.0000 mg | ORAL_TABLET | Freq: Three times a day (TID) | ORAL | Status: DC | PRN

## 2014-02-09 NOTE — Patient Instructions (Signed)
Sciatica °Sciatica is pain, weakness, numbness, or tingling along the path of the sciatic nerve. The nerve starts in the lower back and runs down the back of each leg. The nerve controls the muscles in the lower leg and in the back of the knee, while also providing sensation to the back of the thigh, lower leg, and the sole of your foot. Sciatica is a symptom of another medical condition. For instance, nerve damage or certain conditions, such as a herniated disk or bone spur on the spine, pinch or put pressure on the sciatic nerve. This causes the pain, weakness, or other sensations normally associated with sciatica. Generally, sciatica only affects one side of the body. °CAUSES  °· Herniated or slipped disc. °· Degenerative disk disease. °· A pain disorder involving the narrow muscle in the buttocks (piriformis syndrome). °· Pelvic injury or fracture. °· Pregnancy. °· Tumor (rare). °SYMPTOMS  °Symptoms can vary from mild to very severe. The symptoms usually travel from the low back to the buttocks and down the back of the leg. Symptoms can include: °· Mild tingling or dull aches in the lower back, leg, or hip. °· Numbness in the back of the calf or sole of the foot. °· Burning sensations in the lower back, leg, or hip. °· Sharp pains in the lower back, leg, or hip. °· Leg weakness. °· Severe back pain inhibiting movement. °These symptoms may get worse with coughing, sneezing, laughing, or prolonged sitting or standing. Also, being overweight may worsen symptoms. °DIAGNOSIS  °Your caregiver will perform a physical exam to look for common symptoms of sciatica. He or she may ask you to do certain movements or activities that would trigger sciatic nerve pain. Other tests may be performed to find the cause of the sciatica. These may include: °· Blood tests. °· X-rays. °· Imaging tests, such as an MRI or CT scan. °TREATMENT  °Treatment is directed at the cause of the sciatic pain. Sometimes, treatment is not necessary  and the pain and discomfort goes away on its own. If treatment is needed, your caregiver may suggest: °· Over-the-counter medicines to relieve pain. °· Prescription medicines, such as anti-inflammatory medicine, muscle relaxants, or narcotics. °· Applying heat or ice to the painful area. °· Steroid injections to lessen pain, irritation, and inflammation around the nerve. °· Reducing activity during periods of pain. °· Exercising and stretching to strengthen your abdomen and improve flexibility of your spine. Your caregiver may suggest losing weight if the extra weight makes the back pain worse. °· Physical therapy. °· Surgery to eliminate what is pressing or pinching the nerve, such as a bone spur or part of a herniated disk. °HOME CARE INSTRUCTIONS  °· Only take over-the-counter or prescription medicines for pain or discomfort as directed by your caregiver. °· Apply ice to the affected area for 20 minutes, 3 4 times a day for the first 48 72 hours. Then try heat in the same way. °· Exercise, stretch, or perform your usual activities if these do not aggravate your pain. °· Attend physical therapy sessions as directed by your caregiver. °· Keep all follow-up appointments as directed by your caregiver. °· Do not wear high heels or shoes that do not provide proper support. °· Check your mattress to see if it is too soft. A firm mattress may lessen your pain and discomfort. °SEEK IMMEDIATE MEDICAL CARE IF:  °· You lose control of your bowel or bladder (incontinence). °· You have increasing weakness in the lower back,   pelvis, buttocks, or legs. °· You have redness or swelling of your back. °· You have a burning sensation when you urinate. °· You have pain that gets worse when you lie down or awakens you at night. °· Your pain is worse than you have experienced in the past. °· Your pain is lasting longer than 4 weeks. °· You are suddenly losing weight without reason. °MAKE SURE YOU: °· Understand these  instructions. °· Will watch your condition. °· Will get help right away if you are not doing well or get worse. °Document Released: 09/03/2001 Document Revised: 03/10/2012 Document Reviewed: 01/19/2012 °[employer]® Patient Information ©2014 [employer]. ° °

## 2014-02-09 NOTE — Telephone Encounter (Signed)
Advised pt. Transferred to billing to make appt.

## 2014-02-09 NOTE — Telephone Encounter (Signed)
Please call patient - radiologist did also note left kidney slightly larger than right. If she is interested we can get a renal US for further imaging and evaluation of this. Also, there is glucose in her urine and she should plan to f/u on this with Dr. Brigitte Pulse in the next few weeks - an appointment at 104 would be best if she can arrange this.

## 2014-02-09 NOTE — Progress Notes (Signed)
Subjective:    Patient ID: Sue Baker, female    DOB: December 15, 1955, 58 y.o.   MRN: [id number]  HPI 58 year old female presents for evaluation of lower back pain x 2 weeks. States she has had progressively worsening pain in her left buttocks. States she has had the past 2 days off and her pain has improved significantly.  Denies paresthesias or weakness. No saddle anesthesia's,  Weakness, or bowel/bladder incontinence.  Standing/walking aggravates symptoms. Sitting and bending over/stretching improve pain.    Hx of similar episode 5-6 years ago - treated for mildly herniated disc, symptoms resolved spontaneously.  No issues since then until now.    Current job involves standing on her feet and walking quite a bit over a 12 hr shift.  Walks 0.5 miles from her car to her job.    Takes mobic daily for knee and wrist pain.  No other pain medications tried for this.   PMHx of DM, HTN, hypothyroidism, and hyperlipidemia.   Review of Systems  Musculoskeletal: Positive for back pain. Negative for joint swelling.  Skin: Negative for wound.  Neurological: Negative for weakness and numbness.       Objective:   Physical Exam  Constitutional: She is oriented to person, place, and time. She appears well-developed and well-nourished.  Pt is obese  HENT:  Head: Normocephalic and atraumatic.  Right Ear: External ear normal.  Left Ear: External ear normal.  Eyes: Conjunctivae are normal.  Neck: Normal range of motion.  Cardiovascular: Normal rate.   Pulmonary/Chest: Effort normal.  Musculoskeletal: Normal range of motion.       Lumbar back: She exhibits tenderness (left SI joint). She exhibits normal range of motion, no bony tenderness and no deformity.  Neurological: She is alert and oriented to person, place, and time. She has normal strength. Coordination normal.  Reflex Scores:      Patellar reflexes are 2+ on the right side and 2+ on the left side.      Achilles reflexes are 2+ on the right  side and 2+ on the left side. Psychiatric: She has a normal mood and affect. Her behavior is normal. Judgment and thought content normal.    Results for orders placed in visit on 02/09/14  POCT URINALYSIS DIPSTICK      Result Value Ref Range   Color, UA yellow     Clarity, UA clear     Glucose, UA 500     Bilirubin, UA neg     Ketones, UA neg     Spec Grav, UA >=1.030     Blood, UA trace-lysed     pH, UA 5.5     Protein, UA neg     Urobilinogen, UA 4.0     Nitrite, UA neg     Leukocytes, UA Trace    POCT UA - MICROSCOPIC ONLY      Result Value Ref Range   WBC, Ur, HPF, POC 1-6     RBC, urine, microscopic 1-2     Bacteria, U Microscopic neg     Mucus, UA trace     Epithelial cells, urine per micros 1-3     Crystals, Ur, HPF, POC neg     Casts, Ur, LPF, POC neg     Yeast, UA neg       [facility] reading (PRIMARY) by  Dr. Joseph Art as slight scoliosis with mild calcifications in posterior aorta. Left kidney appears slightly enlarged relative to right. No acute bony abnormalities.  Assessment & Plan:   Sciatica - Plan: cyclobenzaprine (FLEXERIL) 10 MG tablet, traMADol (ULTRAM) 50 MG tablet  Lumbar back pain - Plan: DG Lumbar Spine Complete  Back pain - Plan: POCT urinalysis dipstick, POCT UA - Microscopic Only  Plan to treat as sciatica with Flexeril 5-10 mg at bedtime Continue Mobic daily - caution chronic use due to DM Tramadol 50 mg q8hours prn severe pain.  Restricted duty at work as much as possible - per patient partner ok to work with her on this. Ok to write note if needed to include light duty x 1 week Recheck in 7-10 days if symptoms not improving, sooner if worse.

## 2014-02-11 ENCOUNTER — Encounter: Payer: Self-pay | Admitting: Family Medicine

## 2014-02-11 ENCOUNTER — Ambulatory Visit (INDEPENDENT_AMBULATORY_CARE_PROVIDER_SITE_OTHER): Payer: 59 | Admitting: Family Medicine

## 2014-02-11 VITALS — BP 101/60 | HR 70 | Resp 18 | Wt 234.0 lb

## 2014-02-11 DIAGNOSIS — I10 Essential (primary) hypertension: Secondary | ICD-10-CM

## 2014-02-11 DIAGNOSIS — E785 Hyperlipidemia, unspecified: Secondary | ICD-10-CM

## 2014-02-11 DIAGNOSIS — E119 Type 2 diabetes mellitus without complications: Secondary | ICD-10-CM

## 2014-02-11 DIAGNOSIS — N2881 Hypertrophy of kidney: Secondary | ICD-10-CM

## 2014-02-11 DIAGNOSIS — E039 Hypothyroidism, unspecified: Secondary | ICD-10-CM

## 2014-02-11 DIAGNOSIS — Z9989 Dependence on other enabling machines and devices: Secondary | ICD-10-CM

## 2014-02-11 DIAGNOSIS — R81 Glycosuria: Secondary | ICD-10-CM

## 2014-02-11 DIAGNOSIS — G4733 Obstructive sleep apnea (adult) (pediatric): Secondary | ICD-10-CM

## 2014-02-11 LAB — POCT GLYCOSYLATED HEMOGLOBIN (HGB A1C): Hemoglobin A1C: 6.5

## 2014-02-11 LAB — GLUCOSE, POCT (MANUAL RESULT ENTRY): POC GLUCOSE: 130 mg/dL — AB (ref 70–99)

## 2014-02-11 NOTE — Progress Notes (Signed)
Subjective:    Patient ID: Sue Baker, female    DOB: 04-May-1956, 58 y.o.   MRN: [id number] Chief Complaint  Patient presents with  . Diabetes    states not checking sugar/ had glucose in urine 2 days ago  . Thyroid Problem    has increased thyroid meds per Dr Ouida Sills / taking 175 mcg    HPI  Sue Baker is doing well - her back has been improving.  Had initial appt w/ cards 3d prev - seeing Dr. Claiborne Billings.  Her CPAP machine broke - was about 56 yrs old - so she underwent a repeat split-night PSG last night.  Has been sleeping VERY poorly for the past few wks w/o a CPAP and finally got a little more rest last night during the titration portion. Is really hoping to get a new CPAP machine in the next sev wks. Plans to sched a f/u appt w/ Dr. Claiborne Billings in 2 months and will have her lipid panel checked at that time.  She has been very non-compliant w/ her atorvastatin since she always forgets to take it at night. Has no problem taking other meds in the morning but ends up Only taking atorvastatin about 1x/wk. Just started fenofibrate yesterday. Not fasting currently.  Dr. Ouida Sills increased her levothyroxine dose from 150 to 175 sev wks ago.  Had UA at eval for back pain 2d ago and had glucose of 500 in her urine so here for DM check. SG was >1.030 on that urine. Taking her janumet and not checking sugars.  Also had xray at that eval and radiology report stated that left kidney shadow was slightly > right.  Past Medical History  Diagnosis Date  . Allergy   . Arthritis   . GERD (gastroesophageal reflux disease)   . Cataract   . Diabetes mellitus without complication   . Thyroid disease   . Hypertension    Current Outpatient Prescriptions on File Prior to Visit  Medication Sig Dispense Refill  . aspirin 81 MG tablet Take 81 mg by mouth daily.      Marland Kitchen atorvastatin (LIPITOR) 40 MG tablet Take 1 tablet (40 mg total) by mouth daily.  90 tablet  1  . cyclobenzaprine (FLEXERIL) 10 MG tablet Take 0.5-1  tablets (5-10 mg total) by mouth 3 (three) times daily as needed for muscle spasms.  30 tablet  0  . escitalopram (LEXAPRO) 10 MG tablet TAKE 1 TABLET (10 MG TOTAL) BY MOUTH DAILY.  90 tablet  1  . Fenofibrate (TRICOR PO) Take by mouth.      . levothyroxine (SYNTHROID, LEVOTHROID) 175 MCG tablet TAKE 1 TABLET (150 MCG TOTAL) BY MOUTH DAILY.  90 tablet  1  . meloxicam (MOBIC) 15 MG tablet Take 1 tablet (15 mg total) by mouth daily as needed for pain.  90 tablet  1  . olmesartan-hydrochlorothiazide (BENICAR HCT) 20-12.5 MG per tablet TAKE 1 TABLET BY MOUTH DAILY.  90 tablet  1  . sitaGLIPtin-metformin (JANUMET) 50-500 MG per tablet TAKE 1 TABLET BY MOUTH 2 (TWO) TIMES DAILY WITH A MEAL.  180 tablet  1  . traMADol (ULTRAM) 50 MG tablet Take 1 tablet (50 mg total) by mouth every 8 (eight) hours as needed.  30 tablet  0   No current facility-administered medications on file prior to visit.   No Known Allergies   Review of Systems  Constitutional: Positive for fatigue. Negative for fever, chills, diaphoresis and appetite change.  Eyes: Negative for visual disturbance.  Respiratory: Negative for cough and shortness of breath.   Cardiovascular: Negative for chest pain, palpitations and leg swelling.  Genitourinary: Negative for decreased urine volume.  Neurological: Negative for syncope and headaches.  Hematological: Does not bruise/bleed easily.      BP 101/60  Pulse 70  Resp 18  Wt 234 lb (106.142 kg)  SpO2 96% Objective:   Physical Exam  Constitutional: She is oriented to person, place, and time. She appears well-developed and well-nourished. No distress.  HENT:  Head: Normocephalic and atraumatic.  Right Ear: External ear normal.  Left Ear: External ear normal.  Eyes: Conjunctivae are normal. No scleral icterus.  Neck: Normal range of motion. Neck supple. No thyromegaly present.  Cardiovascular: Normal rate, regular rhythm, normal heart sounds and intact distal pulses.     Pulmonary/Chest: Effort normal and breath sounds normal. No respiratory distress.  Musculoskeletal: She exhibits no edema.  Lymphadenopathy:    She has no cervical adenopathy.  Neurological: She is alert and oriented to person, place, and time.  Skin: Skin is warm and dry. She is not diaphoretic. No erythema.  Psychiatric: She has a normal mood and affect. Her behavior is normal.      Results for orders placed in visit on 02/11/14  POCT GLYCOSYLATED HEMOGLOBIN (HGB A1C)      Result Value Ref Range   Hemoglobin A1C 6.5    GLUCOSE, POCT (MANUAL RESULT ENTRY)      Result Value Ref Range   POC Glucose 130 (*) 70 - 99 mg/dl    Assessment & Plan:   Glucose found in urine on examination - Plan: POCT glycosylated hemoglobin (Hb A1C), POCT glucose (manual entry) - likely just do to concentrated urine at last UA.  Diabetes mellitus - at goal on Janumet bid.  Kidney hypertrophy - Plan: US Renal Lt renal shadow slightly larger than Rt on lumbar xray - check [place]  Unspecified hypothyroidism - Plan: TSH - future TSH ordered for 6-8 wks from now since dose recently increased from 150 to 175 - hopefully can be drawn w/ next labs even if done at outside office or next cardiology visit  HPL - Start taking atorvastatin qam w/ other meds to increase compliance from 1x/wk only when qhs. Just started fenofibrate. Will need flp in sev mos - plan to do this w/ cardiology at f/u visit in 2 mos. Will need LFTs at that time as well.  [facility] - waiting to hear on split-night PSG results (done 5/21) and get new CPAP   Delman Cheadle, MD MPH

## 2014-02-18 ENCOUNTER — Ambulatory Visit
Admission: RE | Admit: 2014-02-18 | Discharge: 2014-02-18 | Disposition: A | Discharge status: Home / Self Care | Payer: 59 | Source: Ambulatory Visit | Attending: Family Medicine | Admitting: Family Medicine

## 2014-02-18 DIAGNOSIS — N2881 Hypertrophy of kidney: Secondary | ICD-10-CM

## 2014-05-25 ENCOUNTER — Other Ambulatory Visit: Payer: Self-pay | Admitting: Family Medicine

## 2014-08-21 ENCOUNTER — Other Ambulatory Visit: Payer: Self-pay | Admitting: Emergency Medicine

## 2014-09-09 ENCOUNTER — Ambulatory Visit: Payer: 59 | Admitting: Family Medicine

## 2014-09-20 ENCOUNTER — Ambulatory Visit (INDEPENDENT_AMBULATORY_CARE_PROVIDER_SITE_OTHER): Payer: 59 | Admitting: Family Medicine

## 2014-09-20 VITALS — BP 122/74 | HR 80 | Temp 98.0°F | Resp 17 | Ht 64.5 in | Wt 235.0 lb

## 2014-09-20 DIAGNOSIS — M5431 Sciatica, right side: Secondary | ICD-10-CM

## 2014-09-20 DIAGNOSIS — K2100 Gastro-esophageal reflux disease with esophagitis, without bleeding: Secondary | ICD-10-CM

## 2014-09-20 DIAGNOSIS — Z1239 Encounter for other screening for malignant neoplasm of breast: Secondary | ICD-10-CM

## 2014-09-20 DIAGNOSIS — E119 Type 2 diabetes mellitus without complications: Secondary | ICD-10-CM

## 2014-09-20 LAB — GLUCOSE, POCT (MANUAL RESULT ENTRY): POC Glucose: 148 mg/dl — AB (ref 70–99)

## 2014-09-20 LAB — COMPREHENSIVE METABOLIC PANEL
ALBUMIN: 4.6 g/dL (ref 3.5–5.2)
ALT: 48 U/L — AB (ref 0–35)
AST: 44 U/L — AB (ref 0–37)
Alkaline Phosphatase: 63 U/L (ref 39–117)
BUN: 19 mg/dL (ref 6–23)
CO2: 27 meq/L (ref 19–32)
Calcium: 9.7 mg/dL (ref 8.4–10.5)
Chloride: 100 mEq/L (ref 96–112)
Creat: 0.83 mg/dL (ref 0.50–1.10)
Glucose, Bld: 133 mg/dL — ABNORMAL HIGH (ref 70–99)
Potassium: 4.3 mEq/L (ref 3.5–5.3)
Sodium: 135 mEq/L (ref 135–145)
Total Bilirubin: 0.9 mg/dL (ref 0.2–1.2)
Total Protein: 7.5 g/dL (ref 6.0–8.3)

## 2014-09-20 LAB — LIPID PANEL
Cholesterol: 189 mg/dL (ref 0–200)
HDL: 36 mg/dL — ABNORMAL LOW (ref >39)
LDL CALC: 117 mg/dL — AB (ref 0–99)
Total CHOL/HDL Ratio: 5.3 Ratio
Triglycerides: 181 mg/dL — ABNORMAL HIGH (ref <150)
VLDL: 36 mg/dL (ref 0–40)

## 2014-09-20 LAB — POCT GLYCOSYLATED HEMOGLOBIN (HGB A1C): Hemoglobin A1C: 7.2

## 2014-09-20 MED ORDER — ATORVASTATIN CALCIUM 40 MG PO TABS: 40.0000 mg | ORAL_TABLET | Freq: Every day | ORAL | Status: DC

## 2014-09-20 MED ORDER — OLMESARTAN MEDOXOMIL-HCTZ 20-12.5 MG PO TABS: 1.0000 | ORAL_TABLET | Freq: Every day | ORAL | Status: DC

## 2014-09-20 MED ORDER — FENOFIBRATE 145 MG PO TABS: 145.0000 mg | ORAL_TABLET | Freq: Every day | ORAL | Status: DC

## 2014-09-20 MED ORDER — ESCITALOPRAM OXALATE 10 MG PO TABS: 10.0000 mg | ORAL_TABLET | Freq: Every day | ORAL | Status: DC

## 2014-09-20 MED ORDER — TRAMADOL HCL 50 MG PO TABS: 50.0000 mg | ORAL_TABLET | Freq: Three times a day (TID) | ORAL | Status: DC | PRN

## 2014-09-20 MED ORDER — MELOXICAM 15 MG PO TABS: 15.0000 mg | ORAL_TABLET | Freq: Every day | ORAL | Status: DC | PRN

## 2014-09-20 MED ORDER — ESOMEPRAZOLE MAGNESIUM 40 MG PO CPDR: 40.0000 mg | DELAYED_RELEASE_CAPSULE | Freq: Every day | ORAL | Status: DC

## 2014-09-20 MED ORDER — SITAGLIPTIN PHOS-METFORMIN HCL 50-500 MG PO TABS: ORAL_TABLET | ORAL | Status: DC

## 2014-09-20 MED ORDER — CYCLOBENZAPRINE HCL 10 MG PO TABS: 5.0000 mg | ORAL_TABLET | Freq: Three times a day (TID) | ORAL | Status: DC | PRN

## 2014-09-20 NOTE — Progress Notes (Addendum)
Subjective:    Patient ID: Sue Baker, female    DOB: Sep 26, 1955, 58 y.o.   MRN: [id number]  This chart was scribed for Sue Knapp, MD by Stephania Fragmin, ED Scribe. This patient was seen in room 9 and the patient's care was started at 11:01 AM.   Chief Complaint  Patient presents with  . Medication Refill    lipitor, flexeril,lexapro,fenofibrate, synthroid,mobic,benicar,janumet,ultram    HPI  HPI Comments: [name] is a 58 y.o. female who presents to the [facility] for a medication refill.   She hasn't eaten anything this morning in preparation for today's visit. Patient states that she checks her blood sugar levels 2-3 times a week, usually 2 hours after eating any meal. They have been around 110, with a couple 130's. She states she regularly takes the morning dose of Janumet, but doesn't take the evening pill regularly because she works 12-hour and night shifts, and worries about overdosing if it's taken too close to the morning dose. She takes her evening dose about 2-3 days a week. Patient denies visual disturbances, numbness of feet, and hypoglycemia, as she snacks between meals.   Patient takes daily Lipitor. She has been taking fenofibrate, prescribed by cardiologist Dr. Claiborne Billings 6 months ago, when she was evaluated for sleep apnea; target LDL<70. She is compliant with Benicar HCT, and reports well-controlled hypertension.  She states that Lexapro has been working well, and has thought about stopping it. She states that without Lexapro, she "cries all time, even though life's been going great." She was previously on Paxil, which she describes as "horrible. I felt okay about everything--house could burn down, and I'd be okay."   She takes meloxicam daily, because without it, her joints are painful. She also takes tramadol and flexeril for right-sided sciatica as needed as needed.   Patient confirms taking a daily aspirin. She states that exercise is difficult due  to 2 jobs. Patient has an eye exam scheduled in 3-4 weeks. She has been using CPAP regularly, which she describes as "wonderful, like taking a sleeping pill." Patient received a flu shot at work in October. She had a pneumonia shot done 1-2 years ago for work. She had a colonoscopy done 4 years ago, which patient reports returned normal.    She complains of a tickling sensation in her throat and coughing up thick phlegm, for which she planned to see [facility] doctor for her symptoms. She endorses indigestion. Patient had chronic hoarseness due to GERD, and was started on Nexium. She last had this problem 4-5 years ago. An endoscopy done 3-5 years ago out of town showed negative-normal results. She states she stopped Nexium for 3-4 months, and restarted 2 weeks ago.   Patient has not gone through menopause. She doesn't have menses, as she had an ablation done due to very heavy, 3-week long menses. Patient had an abnormal pap smear about 30 years ago, with all normal ones since. She denies having an OB/GYN. Patient moved here about 2 years ago, and dislikes the process of finding new providers.   Past Medical History  Diagnosis Date  . Allergy   . Arthritis   . GERD (gastroesophageal reflux disease)   . Cataract   . Diabetes mellitus without complication   . Thyroid disease   . Hypertension     Current Outpatient Prescriptions on File Prior to Visit  Medication Sig Dispense Refill  . aspirin 81 MG tablet Take 81 mg by mouth daily.    Marland Kitchen  atorvastatin (LIPITOR) 40 MG tablet TAKE 1 TABLET BY MOUTH DAILY. 90 tablet 0  . cyclobenzaprine (FLEXERIL) 10 MG tablet Take 0.5-1 tablets (5-10 mg total) by mouth 3 (three) times daily as needed for muscle spasms. 30 tablet 0  . escitalopram (LEXAPRO) 10 MG tablet TAKE 1 TABLET (10 MG TOTAL) BY MOUTH DAILY.  NEEDS OFFICE VISIT FOR ADDITIONAL REFILLS" 30 tablet 0  . Fenofibrate (TRICOR PO) Take by mouth.    . levothyroxine (SYNTHROID, LEVOTHROID) 175 MCG tablet TAKE  1 TABLET (150 MCG TOTAL) BY MOUTH DAILY.  "NEEDS OFFICE VISIT FOR ADDITIONAL REFILLS" 90 tablet 1  . meloxicam (MOBIC) 15 MG tablet Take 1 tablet (15 mg total) by mouth daily as needed for pain. 90 tablet 1  . olmesartan-hydrochlorothiazide (BENICAR HCT) 20-12.5 MG per tablet TAKE 1 TABLET BY MOUTH DAILY.   "NEEDS OFFICE VISIT FOR ADDITIONAL REFILLS" 30 tablet 0  . sitaGLIPtin-metformin (JANUMET) 50-500 MG per tablet TAKE 1 TABLET BY MOUTH 2 (TWO) TIMES DAILY WITH A MEAL. 180 tablet 1  . traMADol (ULTRAM) 50 MG tablet Take 1 tablet (50 mg total) by mouth every 8 (eight) hours as needed. 30 tablet 0   No current facility-administered medications on file prior to visit.   No Known Allergies   Review of Systems  Constitutional: Negative for fever, chills and appetite change.  HENT: Positive for postnasal drip.   Eyes: Negative for photophobia and visual disturbance.  Respiratory: Positive for cough.   Neurological: Negative for dizziness, weakness, light-headedness and numbness.  Psychiatric/Behavioral: Negative for sleep disturbance and dysphoric mood.       Objective:  BP 122/74 mmHg  Pulse 80  Temp(Src) 98 F (36.7 C) (Oral)  Resp 17  Ht 5' 4.5" (1.638 m)  Wt 235 lb (106.595 kg)  BMI 39.73 kg/m2  SpO2 96%   Physical Exam  Constitutional: She is oriented to person, place, and time. She appears well-developed and well-nourished. No distress.  HENT:  Head: Normocephalic and atraumatic.  Eyes: Conjunctivae and EOM are normal.  Neck: Neck supple. No tracheal deviation present. No thyromegaly present.  No thyroid masses palpable.  Cardiovascular: Normal rate, regular rhythm and normal heart sounds.  Exam reveals no friction rub.   No murmur heard. Pulmonary/Chest: Effort normal and breath sounds normal. No respiratory distress. She has no wheezes. She has no rales.  Lungs clear to auscultation bilaterally. Good air movement.  Musculoskeletal: Normal range of motion.    Lymphadenopathy:    She has no cervical adenopathy.       Right: No supraclavicular adenopathy present.       Left: No supraclavicular adenopathy present.  Neurological: She is alert and oriented to person, place, and time.  Skin: Skin is warm and dry.  Psychiatric: She has a normal mood and affect. Her behavior is normal.  Nursing note and vitals reviewed.   Results for orders placed or performed in visit on 02/11/14  POCT glycosylated hemoglobin (Hb A1C)  Result Value Ref Range   Hemoglobin A1C 6.5   POCT glucose (manual entry)  Result Value Ref Range   POC Glucose 130 (A) 70 - 99 mg/dl    Results for orders placed or performed in visit on 09/20/14  POCT glucose (manual entry)  Result Value Ref Range   POC Glucose 148 (A) 70 - 99 mg/dl  POCT glycosylated hemoglobin (Hb A1C)  Result Value Ref Range   Hemoglobin A1C 7.2          Assessment & Plan:  11:16 AM-Discussed treatment plan which includes medication refills, as well as referral to and f/u with a GI for treatment of GERD with pt and pt agreed to plan.   Hypothyroid - REFILL LEVOTHYROXINE AFTER LABS COME BACK. Dose was changed form 150 to 175 after last check.  TSH now suppressed so dose decreased back to 150 - recheck at f/u OV in 3-4 mos.  Type 2 diabetes mellitus without complication - Plan: POCT glucose (manual entry), POCT glycosylated hemoglobin (Hb A1C), Comprehensive metabolic panel, TSH, Lipid panel, HM Diabetes Foot Exam, Microalbumin, urine - pt will increase compliance with bid janumet (was prev only taking qhs dose about 3x/wk). Has appt w/ optho scheduled - requested to have copy of report sent to me. Cont asa and arb, cont statin.  Gastroesophageal reflux disease with esophagitis - Plan: Ambulatory referral to Gastroenterology - sounds like pt needs endoscopy and may need to stay on ppi in future.  No naids.  Sciatica, right - Plan: cyclobenzaprine (FLEXERIL) 10 MG tablet, traMADol (ULTRAM) 50 MG  tablet - using both sparingly - 30 tabs of each has lasted her 7 months  Breast cancer screening - Plan: MM Digital Screening - none prior - refer to [place].  HPL - goal LDL <70 - on atrovastatin 40 and cardiologist Dr. Claiborne Billings added in fenofibrate which she has done well on w/o myalgias. LDL sig worse than prior but has been out of meds for a while so restart and recheck at f/u OV in 4 mos Meds ordered this encounter  Medications  . esomeprazole (NEXIUM) 40 MG capsule    Sig: Take 1 capsule (40 mg total) by mouth daily.    Dispense:  30 capsule    Refill:  3  . cyclobenzaprine (FLEXERIL) 10 MG tablet    Sig: Take 0.5-1 tablets (5-10 mg total) by mouth 3 (three) times daily as needed for muscle spasms.    Dispense:  30 tablet    Refill:  1  . escitalopram (LEXAPRO) 10 MG tablet    Sig: Take 1 tablet (10 mg total) by mouth daily. TAKE 1 TABLET (10 MG TOTAL) BY MOUTH DAILY.    Dispense:  90 tablet    Refill:  3  . meloxicam (MOBIC) 15 MG tablet    Sig: Take 1 tablet (15 mg total) by mouth daily as needed for pain.    Dispense:  90 tablet    Refill:  1  . olmesartan-hydrochlorothiazide (BENICAR HCT) 20-12.5 MG per tablet    Sig: Take 1 tablet by mouth daily. TAKE 1 TABLET BY MOUTH DAILY.    Dispense:  90 tablet    Refill:  3  . traMADol (ULTRAM) 50 MG tablet    Sig: Take 1 tablet (50 mg total) by mouth every 8 (eight) hours as needed.    Dispense:  30 tablet    Refill:  1  . sitaGLIPtin-metformin (JANUMET) 50-500 MG per tablet    Sig: TAKE 1 TABLET BY MOUTH 2 (TWO) TIMES DAILY WITH A MEAL.    Dispense:  180 tablet    Refill:  1  . fenofibrate (TRICOR) 145 MG tablet    Sig: Take 1 tablet (145 mg total) by mouth daily.    Dispense:  90 tablet    Refill:  1  . atorvastatin (LIPITOR) 40 MG tablet    Sig: Take 1 tablet (40 mg total) by mouth daily.    Dispense:  90 tablet    Refill:  1  I personally performed the services described in this documentation, which was scribed  in my presence. The recorded information has been reviewed and considered, and addended by me as needed.  Delman Cheadle, MD MPH

## 2014-09-20 NOTE — Patient Instructions (Signed)
Complementary and Alternative Medical Therapies for Diabetes Complementary and alternative medicines are health care practices or products that are not always accepted as part of routine medicine. Complementary medicine is used along with routine medicine (medical therapy). Alternative medicine can sometimes be used instead of routine medicine. Some people use these methods to treat diabetes. While some of these therapies may be effective, others may not be. Some may even be harmful. Patients using these methods need to tell their caregiver. It is important to let your caregivers know what you are doing. Some of these therapies are discussed below. For more information, talk with your caregiver. THERAPIES Acupuncture Acupuncture is done by a professional who inserts needles into certain points on the skin. Some scientists believe that this triggers the release of the body's natural painkillers. It has been shown to relieve long-term (chronic) pain. This may help patients with painful nerve damage caused by diabetes. Biofeedback Biofeedback helps a person become more aware of the body's response to pain. It also helps you learn to deal with the pain. This alternative therapy focuses on relaxation and stress-reduction techniques. Thinking of peaceful mental images (guided imagery) is one technique. Some people believe these images can ease their condition. MEDICATIONS Chromium Several studies report that chromium supplements may improve diabetes control. Chromium helps insulin improve its action. Research is not yet certain. Supplements have not been recommended or approved. Caution is needed if you have kidney (renal) problems. Ginseng There are several types of ginseng plants. American ginseng is used for diabetes studies. Those studies have shown some glucose-lowering effects. Those effects have been seen with fasting and after-meal blood glucose levels. They have also been seen in A1c levels (average  blood glucose levels over a 39-month period). More long-term studies are needed before recommendations for use of ginseng can be made. Magnesium Experts have studied the relationship between magnesium and diabetes for many years. But it is not yet fully understood. Studies suggest that a low amount of magnesium may make blood glucose control worse in type 2 diabetes. Research also shows that a low amount may contribute to certain diabetes complications. One study showed that people who consume more magnesium had less risk of type 2 diabetes. Eating whole grains, nuts, and green leafy vegetables raises the magnesium level. Vanadium Vanadium is a compound found in tiny amounts in plants and animals. Early studies showed that vanadium improved blood glucose levels in animals with type 1 and type 2 diabetes. One study found that when given vanadium, those with diabetes were able to decrease their insulin dosage. Researchers still need to learn how it works in the body to discover any side effects, and to find safe dosages. Cinnamon There have been a couple of studies that seem to indicate cinnamon decreases insulin resistance and increases insulin production. By doing so, it may lower blood glucose. Exact doses are unknown, but it may work best when used in combination with other diabetes medicines. Document Released: 07/07/2007 Document Revised: 12/02/2011 Document Reviewed: 07/20/2009 [facility] Patient Information 2015 [place], [place]. This information is not intended to replace advice given to you by your health care provider. Make sure you discuss any questions you have with your health care provider.

## 2014-09-21 ENCOUNTER — Encounter: Payer: Self-pay | Admitting: Family Medicine

## 2014-09-21 LAB — MICROALBUMIN, URINE: Microalb, Ur: 0.8 mg/dL (ref <2.0)

## 2014-09-21 LAB — TSH: TSH: 0.112 u[IU]/mL — ABNORMAL LOW (ref 0.350–4.500)

## 2014-09-21 MED ORDER — LEVOTHYROXINE SODIUM 150 MCG PO TABS: ORAL_TABLET | ORAL | Status: DC

## 2014-09-21 NOTE — Addendum Note (Signed)
Addended by: Delman Cheadle on: 09/21/2014 07:46 AM   Modules accepted: Orders, Level of Service

## 2014-09-23 ENCOUNTER — Telehealth: Payer: Self-pay | Admitting: Family Medicine

## 2014-09-23 NOTE — Telephone Encounter (Signed)
LMOM to CB. 

## 2014-09-23 NOTE — Telephone Encounter (Signed)
-----   Message from Shawnee Knapp, MD sent at 09/21/2014  7:44 AM EST ----- Pt needs f/u OV for fasting labs in 3-4 months - please see if she would prefer to schedule this.

## 2014-10-03 NOTE — Telephone Encounter (Signed)
Patient returning call to our office. I indicated to patient we were calling to let her know Dr Brigitte Pulse wanted to see her in 3-4 months for fasting labs/OV. When pulling patient's account up I noticed she  was already scheduled for 01/06/15 @ 9am. I informed patient of this day and time and she is fine with it. Patients call back number is [phone]

## 2014-10-03 NOTE — Progress Notes (Signed)
LMVM advising patient of appt scheduled for 01/06/15 @ 9:00 with Dr. Brigitte Pulse.

## 2015-01-06 ENCOUNTER — Ambulatory Visit (INDEPENDENT_AMBULATORY_CARE_PROVIDER_SITE_OTHER): Payer: 59 | Admitting: Family Medicine

## 2015-01-06 ENCOUNTER — Encounter: Payer: Self-pay | Admitting: Family Medicine

## 2015-01-06 VITALS — BP 108/69 | HR 78 | Temp 98.0°F | Resp 16 | Ht 64.5 in | Wt 235.8 lb

## 2015-01-06 DIAGNOSIS — E559 Vitamin D deficiency, unspecified: Secondary | ICD-10-CM

## 2015-01-06 DIAGNOSIS — R059 Cough, unspecified: Secondary | ICD-10-CM

## 2015-01-06 DIAGNOSIS — J309 Allergic rhinitis, unspecified: Secondary | ICD-10-CM

## 2015-01-06 DIAGNOSIS — K219 Gastro-esophageal reflux disease without esophagitis: Secondary | ICD-10-CM

## 2015-01-06 DIAGNOSIS — E785 Hyperlipidemia, unspecified: Secondary | ICD-10-CM | POA: Diagnosis not present

## 2015-01-06 DIAGNOSIS — M1990 Unspecified osteoarthritis, unspecified site: Secondary | ICD-10-CM

## 2015-01-06 DIAGNOSIS — E039 Hypothyroidism, unspecified: Secondary | ICD-10-CM | POA: Diagnosis not present

## 2015-01-06 DIAGNOSIS — R740 Nonspecific elevation of levels of transaminase and lactic acid dehydrogenase [LDH]: Secondary | ICD-10-CM | POA: Diagnosis not present

## 2015-01-06 DIAGNOSIS — Z79899 Other long term (current) drug therapy: Secondary | ICD-10-CM | POA: Diagnosis not present

## 2015-01-06 DIAGNOSIS — R7401 Elevation of levels of liver transaminase levels: Secondary | ICD-10-CM

## 2015-01-06 DIAGNOSIS — R05 Cough: Secondary | ICD-10-CM | POA: Diagnosis not present

## 2015-01-06 DIAGNOSIS — E118 Type 2 diabetes mellitus with unspecified complications: Secondary | ICD-10-CM | POA: Diagnosis not present

## 2015-01-06 DIAGNOSIS — I10 Essential (primary) hypertension: Secondary | ICD-10-CM | POA: Diagnosis not present

## 2015-01-06 LAB — POCT GLYCOSYLATED HEMOGLOBIN (HGB A1C): Hemoglobin A1C: 7.5

## 2015-01-06 MED ORDER — FLUTICASONE PROPIONATE 50 MCG/ACT NA SUSP: 2.0000 | Freq: Every day | NASAL | Status: DC

## 2015-01-06 MED ORDER — CETIRIZINE HCL 10 MG PO TABS: 10.0000 mg | ORAL_TABLET | Freq: Every day | ORAL | Status: DC

## 2015-01-06 NOTE — Patient Instructions (Signed)
The most common causes of chronic cough in immunocompetent adults include the following: upper airway cough syndrome (UACS), previously referred to as postnasal drip syndrome (PNDS), which is caused by variety of rhinosinus conditions.  Most likely this is Classic Upper airway cough syndrome, so named because it's frequently impossible to sort out how much is chronic rhinitis/sinusitis with freq throat clearing.  Try at night allergy medications of cetirizine and fluticasone nasal spray for at least 2 weeks since this is when cough is the worst. Suppress your cough and throat clearing with candy and delsym, not throat lozenges.  If you would like to try other cough medicine at night such as tessalon pearles, please call.   Cough, Adult  A cough is a reflex that helps clear your throat and airways. It can help heal the body or may be a reaction to an irritated airway. A cough may only last 2 or 3 weeks (acute) or may last more than 8 weeks (chronic).  CAUSES Acute cough:  Viral or bacterial infections. Chronic cough:  Infections.  Allergies.  Asthma.  Post-nasal drip.  Smoking.  Heartburn or acid reflux.  Some medicines.  Chronic lung problems (COPD).  Cancer. SYMPTOMS   Cough.  Fever.  Chest pain.  Increased breathing rate.  High-pitched whistling sound when breathing (wheezing).  Colored mucus that you cough up (sputum). TREATMENT   A bacterial cough may be treated with antibiotic medicine.  A viral cough must run its course and will not respond to antibiotics.  Your caregiver may recommend other treatments if you have a chronic cough. HOME CARE INSTRUCTIONS   Only take over-the-counter or prescription medicines for pain, discomfort, or fever as directed by your caregiver. Use cough suppressants only as directed by your caregiver.  Use a cold steam vaporizer or humidifier in your bedroom or home to help loosen secretions.  Sleep in a semi-upright position if  your cough is worse at night.  Rest as needed.  Stop smoking if you smoke. SEEK IMMEDIATE MEDICAL CARE IF:   You have pus in your sputum.  Your cough starts to worsen.  You cannot control your cough with suppressants and are losing sleep.  You begin coughing up blood.  You have difficulty breathing.  You develop pain which is getting worse or is uncontrolled with medicine.  You have a fever. MAKE SURE YOU:   Understand these instructions.  Will watch your condition.  Will get help right away if you are not doing well or get worse. Document Released: 03/08/2011 Document Revised: 12/02/2011 Document Reviewed: 03/08/2011 [facility] Patient Information 2015 [place], [place]. This information is not intended to replace advice given to you by your health care provider. Make sure you discuss any questions you have with your health care provider.   Allergic Rhinitis Allergic rhinitis is when the mucous membranes in the nose respond to allergens. Allergens are particles in the air that cause your body to have an allergic reaction. This causes you to release allergic antibodies. Through a chain of events, these eventually cause you to release histamine into the blood stream. Although meant to protect the body, it is this release of histamine that causes your discomfort, such as frequent sneezing, congestion, and an itchy, runny nose.  CAUSES  Seasonal allergic rhinitis (hay fever) is caused by pollen allergens that may come from grasses, trees, and weeds. Year-round allergic rhinitis (perennial allergic rhinitis) is caused by allergens such as house dust mites, pet dander, and mold spores.  SYMPTOMS  Nasal stuffiness (congestion).  Itchy, runny nose with sneezing and tearing of the eyes. DIAGNOSIS  Your health care provider can help you determine the allergen or allergens that trigger your symptoms. If you and your health care provider are unable to determine the allergen, skin or blood  testing may be used. TREATMENT  Allergic rhinitis does not have a cure, but it can be controlled by:  Medicines and allergy shots (immunotherapy).  Avoiding the allergen. Hay fever may often be treated with antihistamines in pill or nasal spray forms. Antihistamines block the effects of histamine. There are over-the-counter medicines that may help with nasal congestion and swelling around the eyes. Check with your health care provider before taking or giving this medicine.  If avoiding the allergen or the medicine prescribed do not work, there are many new medicines your health care provider can prescribe. Stronger medicine may be used if initial measures are ineffective. Desensitizing injections can be used if medicine and avoidance does not work. Desensitization is when a patient is given ongoing shots until the body becomes less sensitive to the allergen. Make sure you follow up with your health care provider if problems continue. HOME CARE INSTRUCTIONS It is not possible to completely avoid allergens, but you can reduce your symptoms by taking steps to limit your exposure to them. It helps to know exactly what you are allergic to so that you can avoid your specific triggers. SEEK MEDICAL CARE IF:   You have a fever.  You develop a cough that does not stop easily (persistent).  You have shortness of breath.  You start wheezing.  Symptoms interfere with normal daily activities. Document Released: 06/04/2001 Document Revised: 09/14/2013 Document Reviewed: 05/17/2013 [facility] Patient Information 2015 [place], [place]. This information is not intended to replace advice given to you by your health care provider. Make sure you discuss any questions you have with your health care provider.   Food Choices for Gastroesophageal Reflux Disease When you have gastroesophageal reflux disease (GERD), the foods you eat and your eating habits are very important. Choosing the right foods can help ease the  discomfort of GERD. WHAT GENERAL GUIDELINES DO I NEED TO FOLLOW?  Choose fruits, vegetables, whole grains, low-fat dairy products, and low-fat meat, fish, and poultry.  Limit fats such as oils, salad dressings, butter, nuts, and avocado.  Keep a food diary to identify foods that cause symptoms.  Avoid foods that cause reflux. These may be different for different people.  Eat frequent small meals instead of three large meals each day.  Eat your meals slowly, in a relaxed setting.  Limit fried foods.  Cook foods using methods other than frying.  Avoid drinking alcohol.  Avoid drinking large amounts of liquids with your meals.  Avoid bending over or lying down until 2-3 hours after eating. WHAT FOODS ARE NOT RECOMMENDED? The following are some foods and drinks that may worsen your symptoms: Vegetables Tomatoes. Tomato juice. Tomato and spaghetti sauce. Chili peppers. Onion and garlic. Horseradish. Fruits Oranges, grapefruit, and lemon (fruit and juice). Meats High-fat meats, fish, and poultry. This includes hot dogs, ribs, ham, sausage, salami, and bacon. Dairy Whole milk and chocolate milk. Sour cream. Cream. Butter. Ice cream. Cream cheese.  Beverages Coffee and tea, with or without caffeine. Carbonated beverages or energy drinks. Condiments Hot sauce. Barbecue sauce.  Sweets/Desserts Chocolate and cocoa. Donuts. Peppermint and spearmint. Fats and Oils High-fat foods, including [place] fries and potato chips. Other Vinegar. Strong spices, such as black pepper, white  pepper, red pepper, cayenne, curry powder, cloves, ginger, and chili powder. The items listed above may not be a complete list of foods and beverages to avoid. Contact your dietitian for more information. Document Released: 09/09/2005 Document Revised: 09/14/2013 Document Reviewed: 07/14/2013 [facility] Patient Information 2015 [place], [place]. This information is not intended to replace advice given to you by  your health care provider. Make sure you discuss any questions you have with your health care provider.

## 2015-01-06 NOTE — Progress Notes (Addendum)
Subjective:    Patient ID: Sue Baker, female    DOB: [birth year], 59 y.o.   MRN: [id number] This chart was scribed for Delman Cheadle, MD by Zola Button, Medical Scribe. This patient was seen in Room 29 and the patient's care was started at 10:41 AM.  Chief Complaint  Patient presents with  . Medication check    HPI HPI Comments: Sue Baker is a 59 y.o. female with a hx of GERD and OSA on CPAP who presents to the [facility] for a follow-up.  She was seen 4 months ago.   DIABETES TYPE II:  At last visit 4 mos prior, pt's HGB A1C was 7.2 at that time, up from 6.5 prior. Patient was going to work on increased compliance of her BID Janumet. Her urine micro albumin was normal. Patient has not been to seen an eye doctor.  Hypothyroidism: Her levothyroxine had been increased from 150 to 175 when TSH was 4.8, but then TSH was 0.112, so dose was decreased back down to 150.   Elevated LFTs: She also had elevation of her liver enzymes, presumably due to her elevating cholesterol on a statin. Re-check today. If cholesterol not improved, need to increase Lipitor. If liver not improved, will need to get [place].  CAD: Sees Dr. Claiborne Billings, cardiologism with goal LDL less than 70.    Foot Injury: Patient had a stress fracture of her right foot and was put on a boot for 4 weeks. Her foot is still sore, but has improved. She did not have physical therapy. She has not taken any OTC medications for this. Patient had been seeing Dr. Lyla Glassing at [facility] for this.  Hip Pain: Patient reports having intermittent hip pain, which she has been doing exercises for. She states she has tendonitis of the hips. She has not taken any OTC medications for this.  Allergies: Patient does have seasonal allergies. For this, she takes Zyrtec in the mornings and Afrin prn but not for more than 3d in a row. She reports having fatigue and congestion.  GERD:  At last visit, patient was complaining of severe  GERD symptoms, so was referred to gastroenterology.Patient notes her GERD symptoms had improved; she did not end up seeing gastroenterology because she did not think it was necessary. She does report coughing, and believes this to be attributed to Nexium, which she states has made her coughing worse. She plans to see ENT for this and does not need a referral. No prescription cough medications tried per patient. She has had some difficulty sleeping due to the cough as she sometimes wakes up coughing.  Health Maintenance: She does not know when her last tetanus shot was and her tetanus status is unknown in the charts, but she does agree to have a tetanus shot at the next visit.   Past Medical History  Diagnosis Date  . Allergy   . Arthritis   . GERD (gastroesophageal reflux disease)   . Cataract   . Diabetes mellitus without complication   . Thyroid disease   . Hypertension    Current Outpatient Prescriptions on File Prior to Visit  Medication Sig Dispense Refill  . aspirin 81 MG tablet Take 81 mg by mouth daily.    . cyclobenzaprine (FLEXERIL) 10 MG tablet Take 0.5-1 tablets (5-10 mg total) by mouth 3 (three) times daily as needed for muscle spasms. 30 tablet 1  . escitalopram (LEXAPRO) 10 MG tablet Take 1 tablet (10 mg total) by  mouth daily. TAKE 1 TABLET (10 MG TOTAL) BY MOUTH DAILY. 90 tablet 3  . esomeprazole (NEXIUM) 40 MG capsule Take 1 capsule (40 mg total) by mouth daily. 30 capsule 3  . fenofibrate (TRICOR) 145 MG tablet Take 1 tablet (145 mg total) by mouth daily. 90 tablet 1  . meloxicam (MOBIC) 15 MG tablet Take 1 tablet (15 mg total) by mouth daily as needed for pain. 90 tablet 1  . olmesartan-hydrochlorothiazide (BENICAR HCT) 20-12.5 MG per tablet Take 1 tablet by mouth daily. TAKE 1 TABLET BY MOUTH DAILY. 90 tablet 3  . sitaGLIPtin-metformin (JANUMET) 50-500 MG per tablet TAKE 1 TABLET BY MOUTH 2 (TWO) TIMES DAILY WITH A MEAL. 180 tablet 1  . traMADol (ULTRAM) 50 MG tablet Take  1 tablet (50 mg total) by mouth every 8 (eight) hours as needed. 30 tablet 1   No current facility-administered medications on file prior to visit.   Not on File   Review of Systems  Constitutional: Positive for fatigue. Negative for fever, chills, activity change, appetite change and unexpected weight change.  HENT: Positive for congestion, postnasal drip, rhinorrhea, sinus pressure and sneezing. Negative for ear discharge, ear pain, facial swelling, nosebleeds and trouble swallowing.   Respiratory: Positive for cough. Negative for chest tightness, shortness of breath and wheezing.   Cardiovascular: Positive for leg swelling. Negative for chest pain and palpitations.  Gastrointestinal: Positive for nausea, abdominal pain and diarrhea.  Musculoskeletal: Positive for myalgias, back pain, joint swelling, arthralgias, gait problem and neck stiffness.  Allergic/Immunologic: Positive for environmental allergies.  Neurological: Negative for weakness and numbness.  Psychiatric/Behavioral: Positive for sleep disturbance and dysphoric mood. The patient is not nervous/anxious.        Objective:  BP 108/69 mmHg  Pulse 78  Temp(Src) 98 F (36.7 C) (Oral)  Resp 16  Ht 5' 4.5" (1.638 m)  Wt 235 lb 12.8 oz (106.958 kg)  BMI 39.86 kg/m2  SpO2 96%  Physical Exam  Constitutional: She is oriented to person, place, and time. She appears well-developed and well-nourished. No distress.  HENT:  Head: Normocephalic and atraumatic.  Right Ear: A middle ear effusion is present.  Left Ear: A middle ear effusion is present.  Nose: Mucosal edema and rhinorrhea present.  Mouth/Throat: Posterior oropharyngeal edema present. No oropharyngeal exudate.  Pale nasal mucosal edema and rhinorrhea.  Eyes: Pupils are equal, round, and reactive to light.  Neck: Neck supple. Thyromegaly present.  Enlarged thyroid, no masses or nodules.  Cardiovascular: Normal rate, regular rhythm, S1 normal, S2 normal and normal  heart sounds.   No murmur heard. Pulmonary/Chest: Effort normal and breath sounds normal. No respiratory distress. She has no wheezes. She has no rales.  CTAB.  Musculoskeletal: She exhibits no edema.  Lymphadenopathy:    She has no cervical adenopathy.  Neurological: She is alert and oriented to person, place, and time. No cranial nerve deficit.  Skin: Skin is warm and dry. No rash noted.  Psychiatric: She has a normal mood and affect. Her behavior is normal.  Vitals reviewed.         Assessment & Plan:   1. Essential hypertension - well controlled on current regimen.  2. Gastroesophageal reflux disease, esophagitis presence not specified - on ppi  3. Hypothyroidism, unspecified hypothyroidism type - last visit tsh was decrased from 175 to 150 but tsh still suppressed so decrease to 137 and recheck at next OV before further refills.  4. Arthritis - cont to f/u w/ [place] ortho Dr.  Swinteck  5. Polypharmacy   6. Morbid obesity   7. Hyperlipidemia LDL goal <70 - still not at goal so increase atorvastatin from 40 to 80 and continue tricor - watch for myalgias  8. Allergic rhinitis, unspecified allergic rhinitis type - cont zyrtec, stop afrin, start flonase  9. Cough - chronic and not improved w/ ppi or allergic rhinitis treatment so agree w/ ENT eval that pt plans to sched - cont nasal steroid and ppi until this appt  10. Type 2 diabetes mellitus with complication - X3K has progressively worsened -> 6.5 -> 7.2 -> 7.5 today so increase janumet from 50/500 bid to 50/1000 bid. Reminded to f/u w/ optho  11. Vitamin D deficiency - start high dose weekly x 6 mos, then qd otc 2000u  12. Elevated transaminase level - has been very mild but persistently elevated, no prior imaging studies - suspect fatty liver due to comorbidities but will order RUQ [place] to confirm due to persistent abnormalities in labs   NEEDS TDAP AT NEXT OV.  Orders Placed This Encounter  Procedures  . US Abdomen Limited RUQ     Standing Status: Future     Number of Occurrences:      Standing Expiration Date: 03/10/2016    Order Specific Question:  Reason for Exam (SYMPTOM  OR DIAGNOSIS REQUIRED)    Answer:  persistently elevated LFTs, concern for hepatic steatosis    Order Specific Question:  Preferred imaging location?    Answer:  [facility]  . Vit D  25 hydroxy (rtn osteoporosis monitoring)  . TSH  . Lipid panel    Order Specific Question:  Has the patient fasted?    Answer:  Yes  . Comprehensive metabolic panel    Order Specific Question:  Has the patient fasted?    Answer:  Yes  . POCT glycosylated hemoglobin (Hb A1C)    Meds ordered this encounter  Medications  . cetirizine (ZYRTEC) 10 MG tablet    Sig: Take 1 tablet (10 mg total) by mouth at bedtime.    Dispense:  30 tablet    Refill:  11  . fluticasone (FLONASE) 50 MCG/ACT nasal spray    Sig: Place 2 sprays into both nostrils at bedtime.    Dispense:  16 g    Refill:  2  . atorvastatin (LIPITOR) 80 MG tablet    Sig: Take 1 tablet (80 mg total) by mouth daily at 6 PM.    Dispense:  90 tablet    Refill:  1  . levothyroxine (SYNTHROID, LEVOTHROID) 137 MCG tablet    Sig: Take 1 tablet (137 mcg total) by mouth daily before breakfast.    Dispense:  90 tablet    Refill:  1  . ergocalciferol (DRISDOL) 50000 UNITS capsule    Sig: Take 1 capsule (50,000 Units total) by mouth once a week.    Dispense:  12 capsule    Refill:  1    I personally performed the services described in this documentation, which was scribed in my presence. The recorded information has been reviewed and considered, and addended by me as needed.  Delman Cheadle, MD MPH  Results for orders placed or performed in visit on 01/06/15  Vit D  25 hydroxy (rtn osteoporosis monitoring)  Result Value Ref Range   Vit D, 25-Hydroxy 12 (L) 30 - 100 ng/mL  TSH  Result Value Ref Range   TSH 0.184 (L) 0.350 - 4.500 uIU/mL  Lipid panel  Result Value  Ref Range   Cholesterol 171 0 -  200 mg/dL   Triglycerides 186 (H) <150 mg/dL   HDL 32 (L) >=46 mg/dL   Total CHOL/HDL Ratio 5.3 Ratio   VLDL 37 0 - 40 mg/dL   LDL Cholesterol 102 (H) 0 - 99 mg/dL  Comprehensive metabolic panel  Result Value Ref Range   Sodium 137 135 - 145 mEq/L   Potassium 4.5 3.5 - 5.3 mEq/L   Chloride 100 96 - 112 mEq/L   CO2 25 19 - 32 mEq/L   Glucose, Bld 158 (H) 70 - 99 mg/dL   BUN 20 6 - 23 mg/dL   Creat 0.81 0.50 - 1.10 mg/dL   Total Bilirubin 0.9 0.2 - 1.2 mg/dL   Alkaline Phosphatase 67 39 - 117 U/L   AST 49 (H) 0 - 37 U/L   ALT 47 (H) 0 - 35 U/L   Total Protein 7.2 6.0 - 8.3 g/dL   Albumin 4.5 3.5 - 5.2 g/dL   Calcium 9.9 8.4 - 10.5 mg/dL  POCT glycosylated hemoglobin (Hb A1C)  Result Value Ref Range   Hemoglobin A1C 7.5

## 2015-01-07 LAB — COMPREHENSIVE METABOLIC PANEL WITH GFR
ALT: 47 U/L — ABNORMAL HIGH (ref 0–35)
AST: 49 U/L — ABNORMAL HIGH (ref 0–37)
Albumin: 4.5 g/dL (ref 3.5–5.2)
Alkaline Phosphatase: 67 U/L (ref 39–117)
BUN: 20 mg/dL (ref 6–23)
CO2: 25 meq/L (ref 19–32)
Calcium: 9.9 mg/dL (ref 8.4–10.5)
Chloride: 100 meq/L (ref 96–112)
Creat: 0.81 mg/dL (ref 0.50–1.10)
Glucose, Bld: 158 mg/dL — ABNORMAL HIGH (ref 70–99)
Potassium: 4.5 meq/L (ref 3.5–5.3)
Sodium: 137 meq/L (ref 135–145)
Total Bilirubin: 0.9 mg/dL (ref 0.2–1.2)
Total Protein: 7.2 g/dL (ref 6.0–8.3)

## 2015-01-07 LAB — LIPID PANEL
CHOL/HDL RATIO: 5.3 ratio
Cholesterol: 171 mg/dL (ref 0–200)
HDL: 32 mg/dL — ABNORMAL LOW (ref >=46)
LDL Cholesterol: 102 mg/dL — ABNORMAL HIGH (ref 0–99)
Triglycerides: 186 mg/dL — ABNORMAL HIGH (ref <150)
VLDL: 37 mg/dL (ref 0–40)

## 2015-01-07 LAB — VITAMIN D 25 HYDROXY (VIT D DEFICIENCY, FRACTURES): Vit D, 25-Hydroxy: 12 ng/mL — ABNORMAL LOW (ref 30–100)

## 2015-01-07 LAB — TSH: TSH: 0.184 u[IU]/mL — ABNORMAL LOW (ref 0.350–4.500)

## 2015-01-09 ENCOUNTER — Encounter: Payer: Self-pay | Admitting: Family Medicine

## 2015-01-09 MED ORDER — ESOMEPRAZOLE MAGNESIUM 40 MG PO CPDR: 40.0000 mg | DELAYED_RELEASE_CAPSULE | Freq: Every day | ORAL | Status: DC

## 2015-01-09 MED ORDER — LEVOTHYROXINE SODIUM 137 MCG PO TABS: 137.0000 ug | ORAL_TABLET | Freq: Every day | ORAL | Status: DC

## 2015-01-09 MED ORDER — SITAGLIPTIN PHOS-METFORMIN HCL 50-1000 MG PO TABS: 1.0000 | ORAL_TABLET | Freq: Two times a day (BID) | ORAL | Status: DC

## 2015-01-09 MED ORDER — ATORVASTATIN CALCIUM 80 MG PO TABS: 80.0000 mg | ORAL_TABLET | Freq: Every day | ORAL | Status: DC

## 2015-01-09 MED ORDER — FENOFIBRATE 145 MG PO TABS: 145.0000 mg | ORAL_TABLET | Freq: Every day | ORAL | Status: DC

## 2015-01-09 MED ORDER — ERGOCALCIFEROL 1.25 MG (50000 UT) PO CAPS: 50000.0000 [IU] | ORAL_CAPSULE | ORAL | Status: DC

## 2015-01-20 ENCOUNTER — Ambulatory Visit
Admission: RE | Admit: 2015-01-20 | Discharge: 2015-01-20 | Disposition: A | Discharge status: Home / Self Care | Payer: Self-pay | Source: Ambulatory Visit | Attending: Family Medicine | Admitting: Family Medicine

## 2015-01-20 DIAGNOSIS — Z79899 Other long term (current) drug therapy: Secondary | ICD-10-CM

## 2015-01-20 DIAGNOSIS — E785 Hyperlipidemia, unspecified: Secondary | ICD-10-CM

## 2015-01-20 DIAGNOSIS — R7401 Elevation of levels of liver transaminase levels: Secondary | ICD-10-CM

## 2015-01-20 DIAGNOSIS — R740 Nonspecific elevation of levels of transaminase and lactic acid dehydrogenase [LDH]: Secondary | ICD-10-CM

## 2015-01-20 DIAGNOSIS — E118 Type 2 diabetes mellitus with unspecified complications: Secondary | ICD-10-CM

## 2015-03-03 ENCOUNTER — Encounter: Payer: Self-pay | Admitting: *Deleted

## 2015-03-17 ENCOUNTER — Ambulatory Visit (INDEPENDENT_AMBULATORY_CARE_PROVIDER_SITE_OTHER): Payer: 59 | Admitting: Family Medicine

## 2015-03-17 ENCOUNTER — Encounter: Payer: Self-pay | Admitting: Family Medicine

## 2015-03-17 VITALS — BP 117/62 | HR 63 | Temp 98.9°F | Resp 16 | Ht 64.5 in | Wt 227.2 lb

## 2015-03-17 DIAGNOSIS — S46011A Strain of muscle(s) and tendon(s) of the rotator cuff of right shoulder, initial encounter: Secondary | ICD-10-CM

## 2015-03-17 DIAGNOSIS — K760 Fatty (change of) liver, not elsewhere classified: Secondary | ICD-10-CM

## 2015-03-17 DIAGNOSIS — E039 Hypothyroidism, unspecified: Secondary | ICD-10-CM | POA: Diagnosis not present

## 2015-03-17 DIAGNOSIS — Z79899 Other long term (current) drug therapy: Secondary | ICD-10-CM | POA: Diagnosis not present

## 2015-03-17 DIAGNOSIS — M5431 Sciatica, right side: Secondary | ICD-10-CM | POA: Diagnosis not present

## 2015-03-17 DIAGNOSIS — E119 Type 2 diabetes mellitus without complications: Secondary | ICD-10-CM | POA: Diagnosis not present

## 2015-03-17 DIAGNOSIS — M7541 Impingement syndrome of right shoulder: Secondary | ICD-10-CM | POA: Diagnosis not present

## 2015-03-17 MED ORDER — TRAMADOL HCL 50 MG PO TABS: 50.0000 mg | ORAL_TABLET | Freq: Three times a day (TID) | ORAL | Status: DC | PRN

## 2015-03-17 NOTE — Patient Instructions (Addendum)
Fatty Liver Fatty liver is the accumulation of fat in liver cells. It is also called hepatosteatosis or steatohepatitis. It is normal for your liver to contain some fat. If fat is more than 5 to 10% of your liver's weight, you have fatty liver.  There are often no symptoms (problems) for years while damage is still occurring. People often learn about their fatty liver when they have medical tests for other reasons. Fat can damage your liver for years or even decades without causing problems. When it becomes severe, it can cause fatigue, weight loss, weakness, and confusion. This makes you more likely to develop more serious liver problems. The liver is the largest organ in the body. It does a lot of work and often gives no warning signs when it is sick until late in a disease. The liver has many important jobs including:  Breaking down foods.  Storing vitamins, iron, and other minerals.  Making proteins.  Making bile for food digestion.  Breaking down many products including medications, alcohol and some poisons. CAUSES  There are a number of different conditions, medications, and poisons that can cause a fatty liver. Eating too many calories causes fat to build up in the liver. Not processing and breaking fats down normally may also cause this. Certain conditions, such as obesity, diabetes, and high triglycerides also cause this. Most fatty liver patients tend to be middle-aged and over weight.  Some causes of fatty liver are:  Alcohol over consumption.  Malnutrition.  Steroid use.  Valproic acid toxicity.  Obesity.  Cushing's syndrome.  Poisons.  Tetracycline in high dosages.  Pregnancy.  Diabetes.  Hyperlipidemia.  Rapid weight loss. Some people develop fatty liver even having none of these conditions. SYMPTOMS  Fatty liver most often causes no problems. This is called asymptomatic.  It can be diagnosed with blood tests and also by a liver biopsy.  It is one of the  most common causes of minor elevations of liver enzymes on routine blood tests.  Specialized Imaging of the liver using ultrasound, CT (computed tomography) scan, or MRI (magnetic resonance imaging) can suggest a fatty liver but a biopsy is needed to confirm it.  A biopsy involves taking a small sample of liver tissue. This is done by using a needle. It is then looked at under a microscope by a specialist. TREATMENT  It is important to treat the cause. Simple fatty liver without a medical reason may not need treatment.  Weight loss, fat restriction, and exercise in overweight patients produces inconsistent results but is worth trying.  Fatty liver due to alcohol toxicity may not improve even with stopping drinking.  Good control of diabetes may reduce fatty liver.  Lower your triglycerides through diet, medication or both.  Eat a balanced, healthy diet.  Increase your physical activity.  Get regular checkups from a liver specialist.  There are no medical or surgical treatments for a fatty liver or NASH, but improving your diet and increasing your exercise may help prevent or reverse some of the damage. PROGNOSIS  Fatty liver may cause no damage or it can lead to an inflammation of the liver. This is, called steatohepatitis. When it is linked to alcohol abuse, it is called alcoholic steatohepatitis. It often is not linked to alcohol. It is then called nonalcoholic steatohepatitis, or NASH. Over time the liver may become scarred and hardened. This condition is called cirrhosis. Cirrhosis is serious and may lead to liver failure or cancer. NASH is one of the  leading causes of cirrhosis. About 10-20% of Americans have fatty liver and a smaller 2-5% has NASH. Document Released: 10/25/2005 Document Revised: 12/02/2011 Document Reviewed: 01/19/2014 [facility] Patient Information 2015 [place], [place]. This information is not intended to replace advice given to you by your health care provider. Make  sure you discuss any questions you have with your health care provider. Impingement Syndrome, Rotator Cuff, Bursitis with Rehab Impingement syndrome is a condition that involves inflammation of the tendons of the rotator cuff and the subacromial bursa, that causes pain in the shoulder. The rotator cuff consists of four tendons and muscles that control much of the shoulder and upper arm function. The subacromial bursa is a fluid filled sac that helps reduce friction between the rotator cuff and one of the bones of the shoulder (acromion). Impingement syndrome is usually an overuse injury that causes swelling of the bursa (bursitis), swelling of the tendon (tendonitis), and/or a tear of the tendon (strain). Strains are classified into three categories. Grade 1 strains cause pain, but the tendon is not lengthened. Grade 2 strains include a lengthened ligament, due to the ligament being stretched or partially ruptured. With grade 2 strains there is still function, although the function may be decreased. Grade 3 strains include a complete tear of the tendon or muscle, and function is usually impaired. SYMPTOMS   Pain around the shoulder, often at the outer portion of the upper arm.  Pain that gets worse with shoulder function, especially when reaching overhead or lifting.  Sometimes, aching when not using the arm.  Pain that wakes you up at night.  Sometimes, tenderness, swelling, warmth, or redness over the affected area.  Loss of strength.  Limited motion of the shoulder, especially reaching behind the back (to the back pocket or to unhook bra) or across your body.  Crackling sound (crepitation) when moving the arm.  Biceps tendon pain and inflammation (in the front of the shoulder). Worse when bending the elbow or lifting. CAUSES  Impingement syndrome is often an overuse injury, in which chronic (repetitive) motions cause the tendons or bursa to become inflamed. A strain occurs when a force is  paced on the tendon or muscle that is greater than it can withstand. Common mechanisms of injury include: Stress from sudden increase in duration, frequency, or intensity of training.  Direct hit (trauma) to the shoulder.  Aging, erosion of the tendon with normal use.  Bony bump on shoulder (acromial spur). RISK INCREASES WITH:  Contact sports (football, wrestling, boxing).  Throwing sports (baseball, tennis, volleyball).  Weightlifting and bodybuilding.  Heavy labor.  Previous injury to the rotator cuff, including impingement.  Poor shoulder strength and flexibility.  Failure to warm up properly before activity.  Inadequate protective equipment.  Old age.  Bony bump on shoulder (acromial spur). PREVENTION   Warm up and stretch properly before activity.  Allow for adequate recovery between workouts.  Maintain physical fitness:  Strength, flexibility, and endurance.  Cardiovascular fitness.  Learn and use proper exercise technique. PROGNOSIS  If treated properly, impingement syndrome usually goes away within 6 weeks. Sometimes surgery is required.  RELATED COMPLICATIONS   Longer healing time if not properly treated, or if not given enough time to heal.  Recurring symptoms, that result in a chronic condition.  Shoulder stiffness, frozen shoulder, or loss of motion.  Rotator cuff tendon tear.  Recurring symptoms, especially if activity is resumed too soon, with overuse, with a direct blow, or when using poor technique. TREATMENT  Treatment  first involves the use of ice and medicine, to reduce pain and inflammation. The use of strengthening and stretching exercises may help reduce pain with activity. These exercises may be performed at home or with a therapist. If non-surgical treatment is unsuccessful after more than 6 months, surgery may be advised. After surgery and rehabilitation, activity is usually possible in 3 months.  MEDICATION  If pain medicine is  needed, nonsteroidal anti-inflammatory medicines (aspirin and ibuprofen), or other minor pain relievers (acetaminophen), are often advised.  Do not take pain medicine for 7 days before surgery.  Prescription pain relievers may be given, if your caregiver thinks they are needed. Use only as directed and only as much as you need.  Corticosteroid injections may be given by your caregiver. These injections should be reserved for the most serious cases, because they may only be given a certain number of times. HEAT AND COLD  Cold treatment (icing) should be applied for 10 to 15 minutes every 2 to 3 hours for inflammation and pain, and immediately after activity that aggravates your symptoms. Use ice packs or an ice massage.  Heat treatment may be used before performing stretching and strengthening activities prescribed by your caregiver, physical therapist, or athletic trainer. Use a heat pack or a warm water soak. SEEK MEDICAL CARE IF:   Symptoms get worse or do not improve in 4 to 6 weeks, despite treatment.  New, unexplained symptoms develop. (Drugs used in treatment may produce side effects.) EXERCISES  RANGE OF MOTION (ROM) AND STRETCHING EXERCISES - Impingement Syndrome (Rotator Cuff  Tendinitis, Bursitis) These exercises may help you when beginning to rehabilitate your injury. Your symptoms may go away with or without further involvement from your physician, physical therapist or athletic trainer. While completing these exercises, remember:   Restoring tissue flexibility helps normal motion to return to the joints. This allows healthier, less painful movement and activity.  An effective stretch should be held for at least 30 seconds.  A stretch should never be painful. You should only feel a gentle lengthening or release in the stretched tissue. STRETCH - Flexion, Standing  Stand with good posture. With an underhand grip on your right / left hand, and an overhand grip on the opposite  hand, grasp a broomstick or cane so that your hands are a little more than shoulder width apart.  Keeping your right / left elbow straight and shoulder muscles relaxed, push the stick with your opposite hand, to raise your right / left arm in front of your body and then overhead. Raise your arm until you feel a stretch in your right / left shoulder, but before you have increased shoulder pain.  Try to avoid shrugging your right / left shoulder as your arm rises, by keeping your shoulder blade tucked down and toward your mid-back spine. Hold for __________ seconds.  Slowly return to the starting position. Repeat __________ times. Complete this exercise __________ times per day. STRETCH - Abduction, Supine  Lie on your back. With an underhand grip on your right / left hand and an overhand grip on the opposite hand, grasp a broomstick or cane so that your hands are a little more than shoulder width apart.  Keeping your right / left elbow straight and your shoulder muscles relaxed, push the stick with your opposite hand, to raise your right / left arm out to the side of your body and then overhead. Raise your arm until you feel a stretch in your right / left shoulder,  but before you have increased shoulder pain.  Try to avoid shrugging your right / left shoulder as your arm rises, by keeping your shoulder blade tucked down and toward your mid-back spine. Hold for __________ seconds.  Slowly return to the starting position. Repeat __________ times. Complete this exercise __________ times per day. ROM - Flexion, Active-Assisted  Lie on your back. You may bend your knees for comfort.  Grasp a broomstick or cane so your hands are about shoulder width apart. Your right / left hand should grip the end of the stick, so that your hand is positioned "thumbs-up," as if you were about to shake hands.  Using your healthy arm to lead, raise your right / left arm overhead, until you feel a gentle stretch in  your shoulder. Hold for __________ seconds.  Use the stick to assist in returning your right / left arm to its starting position. Repeat __________ times. Complete this exercise __________ times per day.  ROM - Internal Rotation, Supine   Lie on your back on a firm surface. Place your right / left elbow about 60 degrees away from your side. Elevate your elbow with a folded towel, so that the elbow and shoulder are the same height.  Using a broomstick or cane and your strong arm, pull your right / left hand toward your body until you feel a gentle stretch, but no increase in your shoulder pain. Keep your shoulder and elbow in place throughout the exercise.  Hold for __________ seconds. Slowly return to the starting position. Repeat __________ times. Complete this exercise __________ times per day. STRETCH - Internal Rotation  Place your right / left hand behind your back, palm up.  Throw a towel or belt over your opposite shoulder. Grasp the towel with your right / left hand.  While keeping an upright posture, gently pull up on the towel, until you feel a stretch in the front of your right / left shoulder.  Avoid shrugging your right / left shoulder as your arm rises, by keeping your shoulder blade tucked down and toward your mid-back spine.  Hold for __________ seconds. Release the stretch, by lowering your healthy hand. Repeat __________ times. Complete this exercise __________ times per day. ROM - Internal Rotation   Using an underhand grip, grasp a stick behind your back with both hands.  While standing upright with good posture, slide the stick up your back until you feel a mild stretch in the front of your shoulder.  Hold for __________ seconds. Slowly return to your starting position. Repeat __________ times. Complete this exercise __________ times per day.  STRETCH - Posterior Shoulder Capsule   Stand or sit with good posture. Grasp your right / left elbow and draw it across  your chest, keeping it at the same height as your shoulder.  Pull your elbow, so your upper arm comes in closer to your chest. Pull until you feel a gentle stretch in the back of your shoulder.  Hold for __________ seconds. Repeat __________ times. Complete this exercise __________ times per day. STRENGTHENING EXERCISES - Impingement Syndrome (Rotator Cuff Tendinitis, Bursitis) These exercises may help you when beginning to rehabilitate your injury. They may resolve your symptoms with or without further involvement from your physician, physical therapist or athletic trainer. While completing these exercises, remember:  Muscles can gain both the endurance and the strength needed for everyday activities through controlled exercises.  Complete these exercises as instructed by your physician, physical therapist or athletic trainer.  Increase the resistance and repetitions only as guided.  You may experience muscle soreness or fatigue, but the pain or discomfort you are trying to eliminate should never worsen during these exercises. If this pain does get worse, stop and make sure you are following the directions exactly. If the pain is still present after adjustments, discontinue the exercise until you can discuss the trouble with your clinician.  During your recovery, avoid activity or exercises which involve actions that place your injured hand or elbow above your head or behind your back or head. These positions stress the tissues which you are trying to heal. STRENGTH - Scapular Depression and Adduction   With good posture, sit on a firm chair. Support your arms in front of you, with pillows, arm rests, or on a table top. Have your elbows in line with the sides of your body.  Gently draw your shoulder blades down and toward your mid-back spine. Gradually increase the tension, without tensing the muscles along the top of your shoulders and the back of your neck.  Hold for __________ seconds.  Slowly release the tension and relax your muscles completely before starting the next repetition.  After you have practiced this exercise, remove the arm support and complete the exercise in standing as well as sitting position. Repeat __________ times. Complete this exercise __________ times per day.  STRENGTH - Shoulder Abductors, Isometric  With good posture, stand or sit about 4-6 inches from a wall, with your right / left side facing the wall.  Bend your right / left elbow. Gently press your right / left elbow into the wall. Increase the pressure gradually, until you are pressing as hard as you can, without shrugging your shoulder or increasing any shoulder discomfort.  Hold for __________ seconds.  Release the tension slowly. Relax your shoulder muscles completely before you begin the next repetition. Repeat __________ times. Complete this exercise __________ times per day.  STRENGTH - External Rotators, Isometric  Keep your right / left elbow at your side and bend it 90 degrees.  Step into a door frame so that the outside of your right / left wrist can press against the door frame without your upper arm leaving your side.  Gently press your right / left wrist into the door frame, as if you were trying to swing the back of your hand away from your stomach. Gradually increase the tension, until you are pressing as hard as you can, without shrugging your shoulder or increasing any shoulder discomfort.  Hold for __________ seconds.  Release the tension slowly. Relax your shoulder muscles completely before you begin the next repetition. Repeat __________ times. Complete this exercise __________ times per day.  STRENGTH - Supraspinatus   Stand or sit with good posture. Grasp a __________ weight, or an exercise band or tubing, so that your hand is "thumbs-up," like you are shaking hands.  Slowly lift your right / left arm in a "V" away from your thigh, diagonally into the space between  your side and straight ahead. Lift your hand to shoulder height or as far as you can, without increasing any shoulder pain. At first, many people do not lift their hands above shoulder height.  Avoid shrugging your right / left shoulder as your arm rises, by keeping your shoulder blade tucked down and toward your mid-back spine.  Hold for __________ seconds. Control the descent of your hand, as you slowly return to your starting position. Repeat __________ times. Complete this exercise __________  times per day.  STRENGTH - External Rotators  Secure a rubber exercise band or tubing to a fixed object (table, pole) so that it is at the same height as your right / left elbow when you are standing or sitting on a firm surface.  Stand or sit so that the secured exercise band is at your uninjured side.  Bend your right / left elbow 90 degrees. Place a folded towel or small pillow under your right / left arm, so that your elbow is a few inches away from your side.  Keeping the tension on the exercise band, pull it away from your body, as if pivoting on your elbow. Be sure to keep your body steady, so that the movement is coming only from your rotating shoulder.  Hold for __________ seconds. Release the tension in a controlled manner, as you return to the starting position. Repeat __________ times. Complete this exercise __________ times per day.  STRENGTH - Internal Rotators   Secure a rubber exercise band or tubing to a fixed object (table, pole) so that it is at the same height as your right / left elbow when you are standing or sitting on a firm surface.  Stand or sit so that the secured exercise band is at your right / left side.  Bend your elbow 90 degrees. Place a folded towel or small pillow under your right / left arm so that your elbow is a few inches away from your side.  Keeping the tension on the exercise band, pull it across your body, toward your stomach. Be sure to keep your body  steady, so that the movement is coming only from your rotating shoulder.  Hold for __________ seconds. Release the tension in a controlled manner, as you return to the starting position. Repeat __________ times. Complete this exercise __________ times per day.  STRENGTH - Scapular Protractors, Standing   Stand arms length away from a wall. Place your hands on the wall, keeping your elbows straight.  Begin by dropping your shoulder blades down and toward your mid-back spine.  To strengthen your protractors, keep your shoulder blades down, but slide them forward on your rib cage. It will feel as if you are lifting the back of your rib cage away from the wall. This is a subtle motion and can be challenging to complete. Ask your caregiver for further instruction, if you are not sure you are doing the exercise correctly.  Hold for __________ seconds. Slowly return to the starting position, resting the muscles completely before starting the next repetition. Repeat __________ times. Complete this exercise __________ times per day. STRENGTH - Scapular Protractors, Supine  Lie on your back on a firm surface. Extend your right / left arm straight into the air while holding a __________ weight in your hand.  Keeping your head and back in place, lift your shoulder off the floor.  Hold for __________ seconds. Slowly return to the starting position, and allow your muscles to relax completely before starting the next repetition. Repeat __________ times. Complete this exercise __________ times per day. STRENGTH - Scapular Protractors, Quadruped  Get onto your hands and knees, with your shoulders directly over your hands (or as close as you can be, comfortably).  Keeping your elbows locked, lift the back of your rib cage up into your shoulder blades, so your mid-back rounds out. Keep your neck muscles relaxed.  Hold this position for __________ seconds. Slowly return to the starting position and allow  your  muscles to relax completely before starting the next repetition. Repeat __________ times. Complete this exercise __________ times per day.  STRENGTH - Scapular Retractors  Secure a rubber exercise band or tubing to a fixed object (table, pole), so that it is at the height of your shoulders when you are either standing, or sitting on a firm armless chair.  With a palm down grip, grasp an end of the band in each hand. Straighten your elbows and lift your hands straight in front of you, at shoulder height. Step back, away from the secured end of the band, until it becomes tense.  Squeezing your shoulder blades together, draw your elbows back toward your sides, as you bend them. Keep your upper arms lifted away from your body throughout the exercise.  Hold for __________ seconds. Slowly ease the tension on the band, as you reverse the directions and return to the starting position. Repeat __________ times. Complete this exercise __________ times per day. STRENGTH - Shoulder Extensors   Secure a rubber exercise band or tubing to a fixed object (table, pole) so that it is at the height of your shoulders when you are either standing, or sitting on a firm armless chair.  With a thumbs-up grip, grasp an end of the band in each hand. Straighten your elbows and lift your hands straight in front of you, at shoulder height. Step back, away from the secured end of the band, until it becomes tense.  Squeezing your shoulder blades together, pull your hands down to the sides of your thighs. Do not allow your hands to go behind you.  Hold for __________ seconds. Slowly ease the tension on the band, as you reverse the directions and return to the starting position. Repeat __________ times. Complete this exercise __________ times per day.  STRENGTH - Scapular Retractors and External Rotators   Secure a rubber exercise band or tubing to a fixed object (table, pole) so that it is at the height as your  shoulders, when you are either standing, or sitting on a firm armless chair.  With a palm down grip, grasp an end of the band in each hand. Bend your elbows 90 degrees and lift your elbows to shoulder height, at your sides. Step back, away from the secured end of the band, until it becomes tense.  Squeezing your shoulder blades together, rotate your shoulders so that your upper arms and elbows remain stationary, but your fists travel upward to head height.  Hold for __________ seconds. Slowly ease the tension on the band, as you reverse the directions and return to the starting position. Repeat __________ times. Complete this exercise __________ times per day.  STRENGTH - Scapular Retractors and External Rotators, Rowing   Secure a rubber exercise band or tubing to a fixed object (table, pole) so that it is at the height of your shoulders, when you are either standing, or sitting on a firm armless chair.  With a palm down grip, grasp an end of the band in each hand. Straighten your elbows and lift your hands straight in front of you, at shoulder height. Step back, away from the secured end of the band, until it becomes tense.  Step 1: Squeeze your shoulder blades together. Bending your elbows, draw your hands to your chest, as if you are rowing a boat. At the end of this motion, your hands and elbow should be at shoulder height and your elbows should be out to your sides.  Step 2: Rotate your  shoulders, to raise your hands above your head. Your forearms should be vertical and your upper arms should be horizontal.  Hold for __________ seconds. Slowly ease the tension on the band, as you reverse the directions and return to the starting position. Repeat __________ times. Complete this exercise __________ times per day.  STRENGTH - Scapular Depressors  Find a sturdy chair without wheels, such as a dining room chair.  Keeping your feet on the floor, and your hands on the chair arms, lift your  bottom up from the seat, and lock your elbows.  Keeping your elbows straight, allow gravity to pull your body weight down. Your shoulders will rise toward your ears.  Raise your body against gravity by drawing your shoulder blades down your back, shortening the distance between your shoulders and ears. Although your feet should always maintain contact with the floor, your feet should progressively support less body weight, as you get stronger.  Hold for __________ seconds. In a controlled and slow manner, lower your body weight to begin the next repetition. Repeat __________ times. Complete this exercise __________ times per day.  Document Released: 09/09/2005 Document Revised: 12/02/2011 Document Reviewed: 12/22/2008 [facility] Patient Information 2015 [place], [place]. This information is not intended to replace advice given to you by your health care provider. Make sure you discuss any questions you have with your health care provider.

## 2015-03-17 NOTE — Progress Notes (Addendum)
Subjective:    Patient ID: Sue Baker, female    DOB: 1956-04-14, 59 y.o.   MRN: [id number] This chart was scribed for Delman Cheadle, MD by Zola Button, Medical Scribe. This patient was seen in Room 22 and the patient's care was started at 5:49 PM.   Chief Complaint  Patient presents with  . Shoulder Pain    RIGHT x 6 weeks    HPI HPI Comments: Sue Baker is a 59 y.o. female with a hx of arthritis who presents to the [facility] complaining of right-sided shoulder pain that started 6 weeks ago.  Started on prescription vitamin D. Liver panel continued to show elevation, so she had an [place] which showed fatty infiltration and diffuse hepatocellular disease. We increased her Lipitor from 40 mg to 80 mg and told to watch for myalgias. We increased her Janumet as A1c 10 weeks ago was 7.5 and decreased her levothyroxine from 150 mcg to 137 mcg.   Right Shoulder Pain: This is a new pain to her. The pain sometimes radiates down her arm or to her neck. The pain varies in intensity and is sometimes severe enough to the point where "she wants to cut her arm off." She cannot associate the pain to any activities, but using her arm does make the pain worse. She has tried [employer] and tramadol for the pain. The pain does not wake her up at night. Patient denies weakness and neck inj She denies any specific injuries, but she does use her arms often. She takes Mobic every day for arthritis in her knees. She is unsure if she has had Celebrex before.  Diet: Patient has been working on her diet to lose weight.  Past Medical History  Diagnosis Date  . Allergy   . Arthritis   . GERD (gastroesophageal reflux disease)   . Cataract   . Diabetes mellitus without complication   . Thyroid disease   . Hypertension    Current Outpatient Prescriptions on File Prior to Visit  Medication Sig Dispense Refill  . aspirin 81 MG tablet Take 81 mg by mouth daily.    Marland Kitchen atorvastatin (LIPITOR) 80 MG tablet  Take 1 tablet (80 mg total) by mouth daily at 6 PM. 90 tablet 1  . cyclobenzaprine (FLEXERIL) 10 MG tablet Take 0.5-1 tablets (5-10 mg total) by mouth 3 (three) times daily as needed for muscle spasms. 30 tablet 1  . ergocalciferol (DRISDOL) 50000 UNITS capsule Take 1 capsule (50,000 Units total) by mouth once a week. 12 capsule 1  . escitalopram (LEXAPRO) 10 MG tablet Take 1 tablet (10 mg total) by mouth daily. TAKE 1 TABLET (10 MG TOTAL) BY MOUTH DAILY. 90 tablet 3  . esomeprazole (NEXIUM) 40 MG capsule Take 1 capsule (40 mg total) by mouth daily. 30 capsule 3  . fenofibrate (TRICOR) 145 MG tablet Take 1 tablet (145 mg total) by mouth daily. 90 tablet 1  . fluticasone (FLONASE) 50 MCG/ACT nasal spray Place 2 sprays into both nostrils at bedtime. 16 g 2  . levothyroxine (SYNTHROID, LEVOTHROID) 137 MCG tablet Take 1 tablet (137 mcg total) by mouth daily before breakfast. 90 tablet 1  . olmesartan-hydrochlorothiazide (BENICAR HCT) 20-12.5 MG per tablet Take 1 tablet by mouth daily. TAKE 1 TABLET BY MOUTH DAILY. 90 tablet 3  . sitaGLIPtin-metformin (JANUMET) 50-1000 MG per tablet Take 1 tablet by mouth 2 (two) times daily with a meal. 180 tablet 1   No current facility-administered medications on  file prior to visit.   No Known Allergies   Review of Systems  Constitutional: Positive for activity change. Negative for fever, chills and unexpected weight change.  Musculoskeletal: Positive for myalgias, back pain, arthralgias and neck pain. Negative for joint swelling, gait problem and neck stiffness.  Skin: Negative for color change and rash.  Neurological: Negative for weakness and numbness.  Psychiatric/Behavioral: Positive for sleep disturbance.       Objective:  BP 117/62 mmHg  Pulse 63  Temp(Src) 98.9 F (37.2 C) (Oral)  Resp 16  Ht 5' 4.5" (1.638 m)  Wt 227 lb 3.2 oz (103.057 kg)  BMI 38.41 kg/m2  SpO2 97%  LMP   Physical Exam  Constitutional: She is oriented to person, place,  and time. She appears well-developed and well-nourished. No distress.  HENT:  Head: Normocephalic and atraumatic.  Mouth/Throat: Oropharynx is clear and moist. No oropharyngeal exudate.  Eyes: Pupils are equal, round, and reactive to light.  Neck: Neck supple.  No cervical or paraspinal muscle tenderness. Negative Spurling's. Pain with C-spine lateral rotation to the right.  Cardiovascular: Normal rate.   Pulmonary/Chest: Effort normal.  Musculoskeletal: She exhibits no edema.  Positive empty can. Positive Neer. Positive Hawkins. Mild decrease in internal rotation. Weakness with internal rotation 4+/5 and pain with resisted external rotation.  Neurological: She is alert and oriented to person, place, and time. No cranial nerve deficit.  Reflex Scores:      Tricep reflexes are 2+ on the right side and 2+ on the left side.      Bicep reflexes are 2+ on the right side and 2+ on the left side.      Brachioradialis reflexes are 2+ on the right side and 2+ on the left side. Skin: Skin is warm and dry. No rash noted.  Psychiatric: She has a normal mood and affect. Her behavior is normal.  Vitals reviewed.  Results for orders placed or performed in visit on 03/17/15  Comprehensive metabolic panel  Result Value Ref Range   Sodium 138 135 - 145 mEq/L   Potassium 4.3 3.5 - 5.3 mEq/L   Chloride 102 96 - 112 mEq/L   CO2 25 19 - 32 mEq/L   Glucose, Bld 100 (H) 70 - 99 mg/dL   BUN 17 6 - 23 mg/dL   Creat 0.84 0.50 - 1.10 mg/dL   Total Bilirubin 0.9 0.2 - 1.2 mg/dL   Alkaline Phosphatase 63 39 - 117 U/L   AST 38 (H) 0 - 37 U/L   ALT 40 (H) 0 - 35 U/L   Total Protein 7.5 6.0 - 8.3 g/dL   Albumin 4.6 3.5 - 5.2 g/dL   Calcium 9.8 8.4 - 10.5 mg/dL  TSH  Result Value Ref Range   TSH 0.781 0.350 - 4.500 uIU/mL  Hemoglobin A1c  Result Value Ref Range   Hgb A1c MFr Bld 7.3 (H) <5.7 %   Mean Plasma Glucose 163 (H) <117 mg/dL       Assessment & Plan:   1. Type 2 diabetes mellitus without  complication - E7O improved from past several that were a little higher 7s. Recheck in 3 -4 mos - ok to refill meds when needed  2. Hypothyroidism, unspecified hypothyroidism type - tsh wnml on levothyroxine 137 - refill when needed  3. Polypharmacy   4. Fatty liver   5. Impingement syndrome of shoulder, right   6. Sciatica, right   7. Rotator cuff (capsule) sprain and strain, right, initial encounter -  ice, nsaids, home exercises. If pain persists will refer to PT and cons imaging - if meloxicam not helping could try celebrex instead.    Orders Placed This Encounter  Procedures  . Comprehensive metabolic panel  . TSH  . Hemoglobin A1c  . HM Diabetes Foot Exam    Meds ordered this encounter  Medications  . traMADol (ULTRAM) 50 MG tablet    Sig: Take 1-2 tablets (50-100 mg total) by mouth every 8 (eight) hours as needed.    Dispense:  60 tablet    Refill:  1    I personally performed the services described in this documentation, which was scribed in my presence. The recorded information has been reviewed and considered, and addended by me as needed.  Delman Cheadle, MD MPH

## 2015-03-18 LAB — COMPREHENSIVE METABOLIC PANEL
ALBUMIN: 4.6 g/dL (ref 3.5–5.2)
ALT: 40 U/L — ABNORMAL HIGH (ref 0–35)
AST: 38 U/L — ABNORMAL HIGH (ref 0–37)
Alkaline Phosphatase: 63 U/L (ref 39–117)
BUN: 17 mg/dL (ref 6–23)
CALCIUM: 9.8 mg/dL (ref 8.4–10.5)
CO2: 25 mEq/L (ref 19–32)
CREATININE: 0.84 mg/dL (ref 0.50–1.10)
Chloride: 102 mEq/L (ref 96–112)
Glucose, Bld: 100 mg/dL — ABNORMAL HIGH (ref 70–99)
POTASSIUM: 4.3 meq/L (ref 3.5–5.3)
SODIUM: 138 meq/L (ref 135–145)
TOTAL PROTEIN: 7.5 g/dL (ref 6.0–8.3)
Total Bilirubin: 0.9 mg/dL (ref 0.2–1.2)

## 2015-03-18 LAB — TSH: TSH: 0.781 u[IU]/mL (ref 0.350–4.500)

## 2015-03-19 LAB — HEMOGLOBIN A1C
HEMOGLOBIN A1C: 7.3 % — AB (ref <5.7)
MEAN PLASMA GLUCOSE: 163 mg/dL — AB (ref <117)

## 2015-03-25 ENCOUNTER — Encounter: Payer: Self-pay | Admitting: Family Medicine

## 2015-04-17 ENCOUNTER — Other Ambulatory Visit: Payer: Self-pay | Admitting: Family Medicine

## 2015-05-02 ENCOUNTER — Other Ambulatory Visit: Payer: Self-pay | Admitting: Family Medicine

## 2015-06-17 ENCOUNTER — Ambulatory Visit (INDEPENDENT_AMBULATORY_CARE_PROVIDER_SITE_OTHER): Payer: 59 | Admitting: Physician Assistant

## 2015-06-17 VITALS — BP 104/58 | HR 58 | Temp 98.4°F | Resp 12 | Ht 64.0 in | Wt 219.4 lb

## 2015-06-17 DIAGNOSIS — B029 Zoster without complications: Secondary | ICD-10-CM | POA: Diagnosis not present

## 2015-06-17 MED ORDER — VALACYCLOVIR HCL 1 G PO TABS: 1000.0000 mg | ORAL_TABLET | Freq: Three times a day (TID) | ORAL | Status: AC

## 2015-06-17 NOTE — Patient Instructions (Signed)

## 2015-06-17 NOTE — Progress Notes (Signed)
[facility] [address], [place] [address] [phone]  Date:  06/17/2015   Name:  Sue Baker   DOB:  [birth year]   MRN:  [id number]  PCP:  Delman Cheadle, MD    History of Present Illness:  Sue Baker is a 59 y.o. female patient with pmh listed below who presents to [facility] for chief complaint of rash along right side of abdomen and back for 4 days.  Patient states that she initially felt some tingling that worsened to a feeling of razors across her abdomen if she touched it.  This then travelled to her back.  After a few days, she noticed that a rash broke out that had clusters of blisters and redness.  She denies fever or fatigue.  She has taken benadryl and applied hydrocortisone which helped very little.    Patient Active Problem List   Diagnosis Date Noted  . Arthritis 01/06/2015  . Hyperlipidemia LDL goal <70 02/08/2014  . OSA on CPAP 02/08/2014  . Morbid obesity 02/08/2014  . Depression 02/08/2014  . Osteoarthritis of hand 05/21/2013  . Diabetes mellitus 09/29/2012  . Polypharmacy 09/29/2012  . Hypertension 09/29/2012  . Arthritis of both knees 09/29/2012  . Irregular menses 09/29/2012  . Hypothyroid 09/29/2012  . GERD (gastroesophageal reflux disease) 09/29/2012  . Mood disorder 09/29/2012    Past Medical History  Diagnosis Date  . Allergy   . Arthritis   . GERD (gastroesophageal reflux disease)   . Cataract   . Diabetes mellitus without complication   . Thyroid disease   . Hypertension     Past Surgical History  Procedure Laterality Date  . Breast surgery    . Eye surgery    . Tubal ligation    . Carpal tunnel release    . Knee surgery      left    Social History  Substance Use Topics  . Smoking status: Former Smoker    Quit date: 09/30/1999  . Smokeless tobacco: Never Used  . Alcohol Use: Yes     Comment: rarely    Family History  Problem Relation Age of Onset  . Heart disease Father     No Known  Allergies  Medication list has been reviewed and updated.  Current Outpatient Prescriptions on File Prior to Visit  Medication Sig Dispense Refill  . aspirin 81 MG tablet Take 81 mg by mouth daily.    Marland Kitchen atorvastatin (LIPITOR) 80 MG tablet Take 1 tablet (80 mg total) by mouth daily at 6 PM. 90 tablet 1  . cyclobenzaprine (FLEXERIL) 10 MG tablet Take 0.5-1 tablets (5-10 mg total) by mouth 3 (three) times daily as needed for muscle spasms. 30 tablet 1  . escitalopram (LEXAPRO) 10 MG tablet Take 1 tablet (10 mg total) by mouth daily. TAKE 1 TABLET (10 MG TOTAL) BY MOUTH DAILY. 90 tablet 3  . esomeprazole (NEXIUM) 40 MG capsule Take 1 capsule (40 mg total) by mouth daily. 30 capsule 3  . fenofibrate (TRICOR) 145 MG tablet Take 1 tablet (145 mg total) by mouth daily. 90 tablet 1  . fluticasone (FLONASE) 50 MCG/ACT nasal spray Place 2 sprays into both nostrils at bedtime. 16 g 2  . JANUMET 50-500 MG per tablet TAKE 1 TABLET BY MOUTH 2 (TWO) TIMES DAILY WITH A MEAL. 180 tablet 0  . levothyroxine (SYNTHROID, LEVOTHROID) 137 MCG tablet Take 1 tablet (137 mcg total) by mouth daily before breakfast. 90 tablet 1  . meloxicam (MOBIC) 15  MG tablet TAKE 1 TABLET (15 MG TOTAL) BY MOUTH DAILY AS NEEDED FOR PAIN.  "OV NEEDED FOR REFILLS" 90 tablet 0  . olmesartan-hydrochlorothiazide (BENICAR HCT) 20-12.5 MG per tablet Take 1 tablet by mouth daily. TAKE 1 TABLET BY MOUTH DAILY. 90 tablet 3  . sitaGLIPtin-metformin (JANUMET) 50-1000 MG per tablet Take 1 tablet by mouth 2 (two) times daily with a meal. 180 tablet 1  . traMADol (ULTRAM) 50 MG tablet Take 1-2 tablets (50-100 mg total) by mouth every 8 (eight) hours as needed. 60 tablet 1  . ergocalciferol (DRISDOL) 50000 UNITS capsule Take 1 capsule (50,000 Units total) by mouth once a week. (Patient not taking: Reported on 06/17/2015) 12 capsule 1   No current facility-administered medications on file prior to visit.    ROS ROS otherwise unremarkable unless listed  below.  Physical Examination: BP 104/58 mmHg  Pulse 58  Temp(Src) 98.4 F (36.9 C) (Oral)  Resp 12  Ht 5\' 4"  (1.626 m)  Wt 219 lb 6.4 oz (99.519 kg)  BMI 37.64 kg/m2  SpO2 98% Ideal Body Weight: Weight in (lb) to have BMI = 25: 145.3  Physical Exam  Constitutional: She is oriented to person, place, and time. She appears well-developed and well-nourished. No distress.  HENT:  Head: Normocephalic and atraumatic.  Right Ear: External ear normal.  Left Ear: External ear normal.  Eyes: Conjunctivae and EOM are normal. Pupils are equal, round, and reactive to light.  Cardiovascular: Normal rate.   Pulmonary/Chest: Effort normal. No respiratory distress.  Neurological: She is alert and oriented to person, place, and time.  Skin: She is not diaphoretic.  Three areas of mildly erythematous patches that are tender with palpation.   Along right side of back consistent with a dermatome, is an erythematous raised patch with vesicles along the area.    Psychiatric: She has a normal mood and affect. Her behavior is normal.     Assessment and Plan: 59 year old female is here today with chief complaint of rash.  This is consistent with shingles out break.  Advised 7 days tid of valtrex.  Will hold off on anti-inflammatories, and advised to use hydrocortisone cream or she can continue her benadryl.   -Advised that she get a shingles vaccine when this clears.  -Alarming sxs advised.   1. Herpes zoster - valACYclovir (VALTREX) 1000 MG tablet; Take 1 tablet (1,000 mg total) by mouth 3 (three) times daily.  Dispense: 21 tablet; Refill: 0  Ivar Drape, PA-C [facility] 06/17/2015 8:16 AM

## 2015-07-04 NOTE — Progress Notes (Signed)
  Medical screening examination/treatment/procedure(s) were performed by non-physician practitioner and as supervising physician I was immediately available for consultation/collaboration.     

## 2015-08-16 ENCOUNTER — Other Ambulatory Visit: Payer: Self-pay | Admitting: Physician Assistant

## 2015-08-16 ENCOUNTER — Other Ambulatory Visit: Payer: Self-pay | Admitting: Family Medicine

## 2015-08-29 ENCOUNTER — Other Ambulatory Visit: Payer: Self-pay | Admitting: Family Medicine

## 2015-09-14 ENCOUNTER — Encounter: Payer: Self-pay | Admitting: Family Medicine

## 2015-09-14 ENCOUNTER — Ambulatory Visit (INDEPENDENT_AMBULATORY_CARE_PROVIDER_SITE_OTHER): Payer: 59 | Admitting: Family Medicine

## 2015-09-14 VITALS — BP 97/56 | HR 55 | Temp 97.2°F | Resp 16 | Ht 64.5 in | Wt 223.2 lb

## 2015-09-14 DIAGNOSIS — E559 Vitamin D deficiency, unspecified: Secondary | ICD-10-CM

## 2015-09-14 DIAGNOSIS — E039 Hypothyroidism, unspecified: Secondary | ICD-10-CM

## 2015-09-14 DIAGNOSIS — M5431 Sciatica, right side: Secondary | ICD-10-CM | POA: Diagnosis not present

## 2015-09-14 DIAGNOSIS — I10 Essential (primary) hypertension: Secondary | ICD-10-CM

## 2015-09-14 DIAGNOSIS — G4733 Obstructive sleep apnea (adult) (pediatric): Secondary | ICD-10-CM

## 2015-09-14 DIAGNOSIS — Z79899 Other long term (current) drug therapy: Secondary | ICD-10-CM

## 2015-09-14 DIAGNOSIS — Z9989 Dependence on other enabling machines and devices: Secondary | ICD-10-CM

## 2015-09-14 DIAGNOSIS — E785 Hyperlipidemia, unspecified: Secondary | ICD-10-CM | POA: Diagnosis not present

## 2015-09-14 DIAGNOSIS — E118 Type 2 diabetes mellitus with unspecified complications: Secondary | ICD-10-CM

## 2015-09-14 DIAGNOSIS — Z113 Encounter for screening for infections with a predominantly sexual mode of transmission: Secondary | ICD-10-CM | POA: Diagnosis not present

## 2015-09-14 LAB — LIPID PANEL
CHOLESTEROL: 156 mg/dL (ref 125–200)
HDL: 41 mg/dL — ABNORMAL LOW (ref >=46)
LDL Cholesterol: 90 mg/dL (ref <130)
Total CHOL/HDL Ratio: 3.8 Ratio (ref <=5.0)
Triglycerides: 127 mg/dL (ref <150)
VLDL: 25 mg/dL (ref <30)

## 2015-09-14 LAB — TSH: TSH: 1.814 u[IU]/mL (ref 0.350–4.500)

## 2015-09-14 LAB — COMPREHENSIVE METABOLIC PANEL
ALBUMIN: 4.5 g/dL (ref 3.6–5.1)
ALK PHOS: 49 U/L (ref 33–130)
ALT: 30 U/L — AB (ref 6–29)
AST: 26 U/L (ref 10–35)
BILIRUBIN TOTAL: 0.7 mg/dL (ref 0.2–1.2)
BUN: 17 mg/dL (ref 7–25)
CALCIUM: 9.8 mg/dL (ref 8.6–10.4)
CO2: 27 mmol/L (ref 20–31)
Chloride: 102 mmol/L (ref 98–110)
Creat: 0.86 mg/dL (ref 0.50–1.05)
Glucose, Bld: 110 mg/dL — ABNORMAL HIGH (ref 65–99)
Potassium: 4.3 mmol/L (ref 3.5–5.3)
Sodium: 137 mmol/L (ref 135–146)
TOTAL PROTEIN: 7.1 g/dL (ref 6.1–8.1)

## 2015-09-14 LAB — HEPATITIS C ANTIBODY: HCV AB: NEGATIVE

## 2015-09-14 LAB — HIV ANTIBODY (ROUTINE TESTING W REFLEX): HIV: NONREACTIVE

## 2015-09-14 LAB — POCT GLYCOSYLATED HEMOGLOBIN (HGB A1C): HEMOGLOBIN A1C: 7

## 2015-09-14 LAB — VITAMIN D 25 HYDROXY (VIT D DEFICIENCY, FRACTURES): VIT D 25 HYDROXY: 18 ng/mL — AB (ref 30–100)

## 2015-09-14 MED ORDER — CYCLOBENZAPRINE HCL 10 MG PO TABS: 5.0000 mg | ORAL_TABLET | Freq: Three times a day (TID) | ORAL | Status: DC | PRN

## 2015-09-14 MED ORDER — SITAGLIPTIN PHOS-METFORMIN HCL 50-1000 MG PO TABS: 1.0000 | ORAL_TABLET | Freq: Two times a day (BID) | ORAL | Status: DC

## 2015-09-14 MED ORDER — TRAMADOL HCL 50 MG PO TABS: 50.0000 mg | ORAL_TABLET | Freq: Three times a day (TID) | ORAL | Status: DC | PRN

## 2015-09-14 MED ORDER — ESCITALOPRAM OXALATE 10 MG PO TABS: 10.0000 mg | ORAL_TABLET | Freq: Every day | ORAL | Status: DC

## 2015-09-14 MED ORDER — OLMESARTAN MEDOXOMIL-HCTZ 20-12.5 MG PO TABS: 1.0000 | ORAL_TABLET | Freq: Every day | ORAL | Status: DC

## 2015-09-14 MED ORDER — MELOXICAM 15 MG PO TABS: ORAL_TABLET | ORAL | Status: DC

## 2015-09-14 NOTE — Patient Instructions (Signed)
Diabetes and Standards of Medical Care Diabetes is complicated. You may find that your diabetes team includes a dietitian, nurse, diabetes educator, eye doctor, and more. To help everyone know what is going on and to help you get the care you deserve, the following schedule of care was developed to help keep you on track. Below are the tests, exams, vaccines, medicines, education, and plans you will need. HbA1c test This test shows how well you have controlled your glucose over the past 2-3 months. It is used to see if your diabetes management plan needs to be adjusted.   It is performed at least 2 times a year if you are meeting treatment goals.  It is performed 4 times a year if therapy has changed or if you are not meeting treatment goals. Blood pressure test  This test is performed at every routine medical visit. The goal is less than 140/90 mm Hg for most people, but 130/80 mm Hg in some cases. Ask your health care provider about your goal. Dental exam  Follow up with the dentist regularly. Eye exam  If you are diagnosed with type 1 diabetes as a child, get an exam upon reaching the age of 80 years or older and having had diabetes for 3-5 years. Yearly eye exams are recommended after that initial eye exam.  If you are diagnosed with type 1 diabetes as an adult, get an exam within 5 years of diagnosis and then yearly.  If you are diagnosed with type 2 diabetes, get an exam as soon as possible after the diagnosis and then yearly. Foot care exam  Visual foot exams are performed at every routine medical visit. The exams check for cuts, injuries, or other problems with the feet.  You should have a complete foot exam performed every year. This exam includes an inspection of the structure and skin of your feet, a check of the pulses in your feet, and a check of the sensation in your feet.  Type 1 diabetes: The first exam is performed 5 years after diagnosis.  Type 2 diabetes: The first  exam is performed at the time of diagnosis.  Check your feet nightly for cuts, injuries, or other problems with your feet. Tell your health care provider if anything is not healing. Kidney function test (urine microalbumin)  This test is performed once a year.  Type 1 diabetes: The first test is performed 5 years after diagnosis.  Type 2 diabetes: The first test is performed at the time of diagnosis.  A serum creatinine and estimated glomerular filtration rate (eGFR) test is done once a year to assess the level of chronic kidney disease (CKD), if present. Lipid profile (cholesterol, HDL, LDL, triglycerides)  Performed every 5 years for most people.  The goal for LDL is less than 100 mg/dL. If you are at high risk, the goal is less than 70 mg/dL.  The goal for HDL is 40 mg/dL-50 mg/dL for men and 50 mg/dL-60 mg/dL for women. An HDL cholesterol of 60 mg/dL or higher gives some protection against heart disease.  The goal for triglycerides is less than 150 mg/dL. Immunizations  The flu (influenza) vaccine is recommended yearly for every person 30 months of age or older who has diabetes.  The pneumonia (pneumococcal) vaccine is recommended for every person 38 years of age or older who has diabetes. Adults 57 years of age or older may receive the pneumonia vaccine as a series of two separate shots.  The hepatitis B  vaccine is recommended for adults shortly after they have been diagnosed with diabetes.  The Tdap (tetanus, diphtheria, and pertussis) vaccine should be given:  According to normal childhood vaccination schedules, for children.  Every 10 years, for adults who have diabetes. Diabetes self-management education  Education is recommended at diagnosis and ongoing as needed. Treatment plan  Your treatment plan is reviewed at every medical visit.   This information is not intended to replace advice given to you by your health care provider. Make sure you discuss any questions you  have with your health care provider.   Document Released: 07/07/2009 Document Revised: 09/30/2014 Document Reviewed: 02/09/2013 [employer] Interactive Patient Education 2016 [employer]  

## 2015-09-14 NOTE — Progress Notes (Signed)
Subjective:    Patient ID: Sue Baker, female    DOB: 1956-09-02, 59 y.o.   MRN: [id number] Chief Complaint  Patient presents with  . Medication Refill    all RXs with check marks    HPI Has just been on janumet for the last month - still is on 50-500 bid.  We had incrased her up to 50-1000 at her last visit but epic now shows 50-500 and pt reports this is the dose she is taking. Not sure if she ever picked up the higher dose or what.  Had shingles episode 3 mos prior, now completely resolved.  WE HAVE not rechecked her cholseterol since going up on the lipitor to 80mg .  Taking the meloxicam once a day for arthritis - has to take it helping a lot .  Uses tramadol sparingly - just when she overdoes it.  Could consider decreasing bp med but asymptomatic on it, does not check bp outside office.  Taking centrum mvi.  Some calcium in diet but not regularly. No extra ca/d supp.  Poor diet - lives alone and works all the time so often finds herself eating a fastfood hamburger in the car, no big desire to change. Not getting any exercise but feels her job is physically active.  Eye doctor in November - [facility]  occ heartburn, quit nexium - uses tums about 1x/wk only  Pelvic in Nov - gyn in [place] sched in January Requests hep C and hiv  Tetanus < 10 year  Past Medical History  Diagnosis Date  . Allergy   . Arthritis   . GERD (gastroesophageal reflux disease)   . Cataract   . Diabetes mellitus without complication ([place])   . Thyroid disease   . Hypertension    Past Surgical History  Procedure Laterality Date  . Breast surgery    . Eye surgery    . Tubal ligation    . Carpal tunnel release    . Knee surgery      left   Current Outpatient Prescriptions on File Prior to Visit  Medication Sig Dispense Refill  . aspirin 81 MG tablet Take 81 mg by mouth daily.     No current facility-administered medications on file prior to visit.   No Known  Allergies Family History  Problem Relation Age of Onset  . Heart disease Father    Social History   Social History  . Marital Status: Single    Spouse Name: N/A  . Number of Children: N/A  . Years of Education: N/A   Social History Main Topics  . Smoking status: Former Smoker    Quit date: 09/30/1999  . Smokeless tobacco: Never Used  . Alcohol Use: Yes     Comment: rarely  . Drug Use: No  . Sexual Activity: No   Other Topics Concern  . None   Social History Narrative    Review of Systems  Constitutional: Positive for fatigue. Negative for fever, chills, diaphoresis and appetite change.  Eyes: Negative for visual disturbance.  Respiratory: Negative for cough and shortness of breath.   Cardiovascular: Negative for chest pain, palpitations and leg swelling.  Genitourinary: Negative for decreased urine volume.  Musculoskeletal: Positive for joint swelling and arthralgias. Negative for gait problem.  Neurological: Negative for syncope, numbness and headaches.  Hematological: Does not bruise/bleed easily.         Objective:  BP 97/56 mmHg  Pulse 55  Temp(Src) 97.2 F (36.2 C) (Oral)  Resp 16  Ht 5' 4.5" (1.638 m)  Wt 223 lb 3.2 oz (101.243 kg)  BMI 37.73 kg/m2  SpO2 96%  Physical Exam  Constitutional: She is oriented to person, place, and time. She appears well-developed and well-nourished. No distress.  HENT:  Head: Normocephalic and atraumatic.  Right Ear: External ear normal.  Left Ear: External ear normal.  Eyes: Conjunctivae are normal. No scleral icterus.  Neck: Normal range of motion. Neck supple. No thyromegaly present.  Cardiovascular: Normal rate, regular rhythm, normal heart sounds and intact distal pulses.   Pulmonary/Chest: Effort normal and breath sounds normal. No respiratory distress.  Musculoskeletal: She exhibits no edema.  Lymphadenopathy:    She has no cervical adenopathy.  Neurological: She is alert and oriented to person, place, and  time.  Skin: Skin is warm and dry. She is not diaphoretic. No erythema.  Psychiatric: She has a normal mood and affect. Her behavior is normal.       Assessment & Plan:    1. Essential hypertension - has been very well controlled, could decrease benicar-hctz dose but as pt is doing well, asymptomatic, no med side effects will leave on current dose  2. OSA on CPAP   3. Type 2 diabetes mellitus with complication, without long-term current use of insulin (HCC) -a1c 7.0 today. Unable to provide urine sample today - needs microalbumin at f/u.  I had pt on janumet 50-1000 but she is not taking 50-500 again and she is not sure how this happened- may have gotten the wrong dose refilled?? Switch back to 50-1000. Cont asa. Had optho appt with [facility] last mo. Feet fine.  4. Hypothyroidism, unspecified hypothyroidism type - tsh nml, cont levothyroxine 137  5. Polypharmacy   6. Hyperlipidemia LDL goal <70 - not at goal on maximum medical therapy but improving, cont tricor and lipitor  7. Vitamin D deficiency - improved, restart high dose replacement x6 mos  8. Sciatica, right - rarely using prn tramadol, using meloxicam daily for arthralgias  9. Screening for STD (sexually transmitted disease)     Orders Placed This Encounter  Procedures  . VITAMIN D 25 Hydroxy (Vit-D Deficiency, Fractures)  . Lipid panel    Order Specific Question:  Has the patient fasted?    Answer:  Yes  . Comprehensive metabolic panel    Order Specific Question:  Has the patient fasted?    Answer:  Yes  . TSH  . Microalbumin/Creatinine Ratio, Urine  . HIV antibody  . Hepatitis C Antibody  . POCT glycosylated hemoglobin (Hb A1C)    Meds ordered this encounter  Medications  . escitalopram (LEXAPRO) 10 MG tablet    Sig: Take 1 tablet (10 mg total) by mouth daily. TAKE 1 TABLET (10 MG TOTAL) BY MOUTH DAILY.    Dispense:  90 tablet    Refill:  3  . traMADol (ULTRAM) 50 MG tablet    Sig: Take 1-2 tablets (50-100 mg  total) by mouth every 8 (eight) hours as needed.    Dispense:  60 tablet    Refill:  1  . meloxicam (MOBIC) 15 MG tablet    Sig: TAKE 1 TABLET (15 MG TOTAL) BY MOUTH DAILY AS NEEDED FOR PAIN "NO MORE REFILLS WITHOUT OFFICE VISIT    Dispense:  30 tablet    Refill:  0  . cyclobenzaprine (FLEXERIL) 10 MG tablet    Sig: Take 0.5-1 tablets (5-10 mg total) by mouth 3 (three) times daily as needed for muscle spasms.    Dispense:  30 tablet    Refill:  1  . olmesartan-hydrochlorothiazide (BENICAR HCT) 20-12.5 MG tablet    Sig: Take 1 tablet by mouth daily. TAKE 1 TABLET BY MOUTH DAILY.    Dispense:  90 tablet    Refill:  3  . sitaGLIPtin-metformin (JANUMET) 50-1000 MG tablet    Sig: Take 1 tablet by mouth 2 (two) times daily with a meal.    Dispense:  180 tablet    Refill:  3     Delman Cheadle, MD MPH  Results for orders placed or performed in visit on 09/14/15  VITAMIN D 25 Hydroxy (Vit-D Deficiency, Fractures)  Result Value Ref Range   Vit D, 25-Hydroxy 18 (L) 30 - 100 ng/mL  Lipid panel  Result Value Ref Range   Cholesterol 156 125 - 200 mg/dL   Triglycerides 127 <150 mg/dL   HDL 41 (L) >=46 mg/dL   Total CHOL/HDL Ratio 3.8 <=5.0 Ratio   VLDL 25 <30 mg/dL   LDL Cholesterol 90 <130 mg/dL  Comprehensive metabolic panel  Result Value Ref Range   Sodium 137 135 - 146 mmol/L   Potassium 4.3 3.5 - 5.3 mmol/L   Chloride 102 98 - 110 mmol/L   CO2 27 20 - 31 mmol/L   Glucose, Bld 110 (H) 65 - 99 mg/dL   BUN 17 7 - 25 mg/dL   Creat 0.86 0.50 - 1.05 mg/dL   Total Bilirubin 0.7 0.2 - 1.2 mg/dL   Alkaline Phosphatase 49 33 - 130 U/L   AST 26 10 - 35 U/L   ALT 30 (H) 6 - 29 U/L   Total Protein 7.1 6.1 - 8.1 g/dL   Albumin 4.5 3.6 - 5.1 g/dL   Calcium 9.8 8.6 - 10.4 mg/dL  TSH  Result Value Ref Range   TSH 1.814 0.350 - 4.500 uIU/mL  HIV antibody  Result Value Ref Range   HIV 1&2 Ab, 4th Generation NONREACTIVE NONREACTIVE  Hepatitis C Antibody  Result Value Ref Range   HCV Ab  NEGATIVE NEGATIVE  POCT glycosylated hemoglobin (Hb A1C)  Result Value Ref Range   Hemoglobin A1C 7.0

## 2015-09-15 ENCOUNTER — Encounter: Payer: Self-pay | Admitting: Family Medicine

## 2015-09-15 MED ORDER — ERGOCALCIFEROL 1.25 MG (50000 UT) PO CAPS: 50000.0000 [IU] | ORAL_CAPSULE | ORAL | Status: DC

## 2015-09-15 MED ORDER — FENOFIBRATE 145 MG PO TABS: 145.0000 mg | ORAL_TABLET | Freq: Every day | ORAL | Status: DC

## 2015-09-15 MED ORDER — ATORVASTATIN CALCIUM 80 MG PO TABS: 80.0000 mg | ORAL_TABLET | Freq: Every day | ORAL | Status: DC

## 2015-09-15 MED ORDER — LEVOTHYROXINE SODIUM 137 MCG PO TABS: 137.0000 ug | ORAL_TABLET | Freq: Every day | ORAL | Status: DC

## 2015-09-16 ENCOUNTER — Other Ambulatory Visit: Payer: Self-pay | Admitting: Family Medicine

## 2015-10-04 ENCOUNTER — Other Ambulatory Visit: Payer: Self-pay | Admitting: Family Medicine

## 2015-12-10 ENCOUNTER — Other Ambulatory Visit: Payer: Self-pay | Admitting: Family Medicine

## 2016-02-26 ENCOUNTER — Other Ambulatory Visit: Payer: Self-pay | Admitting: Family Medicine

## 2016-03-13 LAB — HM DIABETES EYE EXAM

## 2016-04-26 ENCOUNTER — Other Ambulatory Visit: Payer: Self-pay | Admitting: Family Medicine

## 2016-05-08 ENCOUNTER — Encounter: Payer: Self-pay | Admitting: Family Medicine

## 2016-05-10 ENCOUNTER — Other Ambulatory Visit: Payer: Self-pay | Admitting: Family Medicine

## 2016-06-20 ENCOUNTER — Encounter: Payer: Self-pay | Admitting: Family Medicine

## 2016-06-20 ENCOUNTER — Ambulatory Visit (INDEPENDENT_AMBULATORY_CARE_PROVIDER_SITE_OTHER): Payer: 59 | Admitting: Family Medicine

## 2016-06-20 VITALS — BP 116/62 | HR 61 | Temp 99.0°F | Resp 15 | Ht 64.5 in | Wt 233.8 lb

## 2016-06-20 DIAGNOSIS — E785 Hyperlipidemia, unspecified: Secondary | ICD-10-CM | POA: Diagnosis not present

## 2016-06-20 DIAGNOSIS — E118 Type 2 diabetes mellitus with unspecified complications: Secondary | ICD-10-CM

## 2016-06-20 DIAGNOSIS — E039 Hypothyroidism, unspecified: Secondary | ICD-10-CM | POA: Diagnosis not present

## 2016-06-20 DIAGNOSIS — I10 Essential (primary) hypertension: Secondary | ICD-10-CM

## 2016-06-20 DIAGNOSIS — Z79899 Other long term (current) drug therapy: Secondary | ICD-10-CM | POA: Diagnosis not present

## 2016-06-20 DIAGNOSIS — M5431 Sciatica, right side: Secondary | ICD-10-CM

## 2016-06-20 LAB — POCT GLYCOSYLATED HEMOGLOBIN (HGB A1C): Hemoglobin A1C: 7.1

## 2016-06-20 MED ORDER — OLMESARTAN MEDOXOMIL-HCTZ 20-12.5 MG PO TABS: 1.0000 | ORAL_TABLET | Freq: Every day | ORAL | 3 refills | Status: DC

## 2016-06-20 MED ORDER — FENOFIBRATE 145 MG PO TABS: 145.0000 mg | ORAL_TABLET | Freq: Every day | ORAL | 3 refills | Status: DC

## 2016-06-20 MED ORDER — ZOSTER VACCINE LIVE 19400 UNT/0.65ML ~~LOC~~ SUSR: 0.6500 mL | Freq: Once | SUBCUTANEOUS | 0 refills | Status: AC

## 2016-06-20 MED ORDER — ATORVASTATIN CALCIUM 80 MG PO TABS: 80.0000 mg | ORAL_TABLET | Freq: Every day | ORAL | 3 refills | Status: DC

## 2016-06-20 MED ORDER — LEVOTHYROXINE SODIUM 137 MCG PO TABS: 137.0000 ug | ORAL_TABLET | Freq: Every day | ORAL | 1 refills | Status: DC

## 2016-06-20 MED ORDER — ESCITALOPRAM OXALATE 10 MG PO TABS: 10.0000 mg | ORAL_TABLET | Freq: Every day | ORAL | 3 refills | Status: DC

## 2016-06-20 MED ORDER — MELOXICAM 15 MG PO TABS: 15.0000 mg | ORAL_TABLET | Freq: Every day | ORAL | 1 refills | Status: DC | PRN

## 2016-06-20 MED ORDER — CYCLOBENZAPRINE HCL 10 MG PO TABS: 5.0000 mg | ORAL_TABLET | Freq: Three times a day (TID) | ORAL | 1 refills | Status: DC | PRN

## 2016-06-20 MED ORDER — TRAMADOL HCL 50 MG PO TABS: 50.0000 mg | ORAL_TABLET | Freq: Three times a day (TID) | ORAL | 1 refills | Status: DC | PRN

## 2016-06-20 MED ORDER — SITAGLIPTIN PHOS-METFORMIN HCL 50-1000 MG PO TABS: 1.0000 | ORAL_TABLET | Freq: Two times a day (BID) | ORAL | 3 refills | Status: DC

## 2016-06-20 NOTE — Progress Notes (Signed)
Subjective:    Patient ID: Sue Baker, female    DOB: Jan 27, 1956, 60 y.o.   MRN: [id number] Chief Complaint  Patient presents with  . Medication Refill    ALL CHRONIC MEDICATIONS     HPI  Sue Baker is a delightful 60 yo woman here for a follow-up on her chronic medical conditions.  I last saw her 58 mos prior for the same.  DMII: hgba1c 7.0 -> 7.1 today. On Janumet 50-1000 and asa. Saw optho at [facility] Nov 2016. Feet fine. Poor diet - lives alone and works all the time so often finds herself eating a fastfood hamburger in the car, no big desire to change.  Missing her meds fairly regularly but not sure why.  Not getting any exercise but feels her job is physically active.  Hypothyroid: Has been stable on levothyroxine 137  HTN: Has been very well controlled on benicar-hctz prior.  HPL: has been on maximal medical therapy of tricor and lipitor 80 but slowly improving. Transaminases have been slightly elevated.  Arthritis/Sciatica: Meloxicam daily with rare prn tramadol. Sev d of lightheaded/loopy  Vit D def  Depression screen [facility] 2/9 06/20/2016 09/14/2015 06/17/2015 03/17/2015 01/06/2015  Decreased Interest 0 0 0 0 0  Down, Depressed, Hopeless 0 0 0 0 0  PHQ - 2 Score 0 0 0 0 0    Past Medical History:  Diagnosis Date  . Allergy   . Arthritis   . Cataract   . Diabetes mellitus without complication ([place])   . GERD (gastroesophageal reflux disease)   . Hypertension   . Thyroid disease    Past Surgical History:  Procedure Laterality Date  . BREAST SURGERY    . CARPAL TUNNEL RELEASE    . EYE SURGERY    . KNEE SURGERY     left  . TUBAL LIGATION     Current Outpatient Prescriptions on File Prior to Visit  Medication Sig Dispense Refill  . aspirin 81 MG tablet Take 81 mg by mouth daily.     No current facility-administered medications on file prior to visit.    No Known Allergies Family History  Problem Relation Age of Onset  . Heart disease Father    Social  History   Social History  . Marital status: Single    Spouse name: N/A  . Number of children: N/A  . Years of education: N/A   Social History Main Topics  . Smoking status: Former Smoker    Quit date: 09/30/1999  . Smokeless tobacco: Never Used  . Alcohol use Yes     Comment: rarely  . Drug use: No  . Sexual activity: No   Other Topics Concern  . None   Social History Narrative  . None     Review of Systems  Constitutional: Positive for fatigue. Negative for appetite change, chills, diaphoresis and fever.  Eyes: Negative for visual disturbance.  Respiratory: Negative for cough and shortness of breath.   Cardiovascular: Negative for chest pain, palpitations and leg swelling.  Genitourinary: Negative for decreased urine volume.  Neurological: Negative for syncope and headaches.  Hematological: Does not bruise/bleed easily.       Objective:   Physical Exam  Constitutional: She is oriented to person, place, and time. She appears well-developed and well-nourished. No distress.  HENT:  Head: Normocephalic and atraumatic.  Right Ear: External ear normal.  Left Ear: External ear normal.  Eyes: Conjunctivae are normal. No scleral icterus.  Neck: Normal range of motion. Neck  supple. No thyromegaly present.  Cardiovascular: Normal rate, regular rhythm, normal heart sounds and intact distal pulses.   Pulmonary/Chest: Effort normal and breath sounds normal. No respiratory distress.  Musculoskeletal: She exhibits no edema.  Lymphadenopathy:    She has no cervical adenopathy.  Neurological: She is alert and oriented to person, place, and time.  Skin: Skin is warm and dry. She is not diaphoretic. No erythema.  Psychiatric: She has a normal mood and affect. Her behavior is normal.     Diabetic Foot Exam - Simple   Simple Foot Form Diabetic Foot exam was performed with the following findings:  Yes 06/20/2016  4:53 PM  Visual Inspection No deformities, no ulcerations, no other  skin breakdown bilaterally:  Yes Sensation Testing Intact to touch and monofilament testing bilaterally:  Yes Pulse Check Posterior Tibialis and Dorsalis pulse intact bilaterally:  Yes Comments        BP 116/62 (BP Location: Left Arm, Patient Position: Sitting, Cuff Size: Large)   Pulse 61   Temp 99 F (37.2 C) (Oral)   Resp 15   Ht 5' 4.5" (1.638 m)   Wt 233 lb 12.8 oz (106.1 kg)   SpO2 96%   BMI 39.51 kg/m   Results for orders placed or performed in visit on 06/20/16  Comprehensive metabolic panel  Result Value Ref Range   Sodium 138 135 - 146 mmol/L   Potassium 4.2 3.5 - 5.3 mmol/L   Chloride 98 98 - 110 mmol/L   CO2 26 20 - 31 mmol/L   Glucose, Bld 129 (H) 65 - 99 mg/dL   BUN 23 7 - 25 mg/dL   Creat 1.04 0.50 - 1.05 mg/dL   Total Bilirubin 0.7 0.2 - 1.2 mg/dL   Alkaline Phosphatase 55 33 - 130 U/L   AST 51 (H) 10 - 35 U/L   ALT 39 (H) 6 - 29 U/L   Total Protein 7.1 6.1 - 8.1 g/dL   Albumin 4.4 3.6 - 5.1 g/dL   Calcium 9.7 8.6 - 10.4 mg/dL  TSH  Result Value Ref Range   TSH 8.24 (H) mIU/L  Microalbumin/Creatinine Ratio, Urine  Result Value Ref Range   Creatinine, Urine 129 20 - 320 mg/dL   Microalb, Ur 0.7 Not estab mg/dL   Microalb Creat Ratio 5 <30 mcg/mg creat  POCT glycosylated hemoglobin (Hb A1C)  Result Value Ref Range   Hemoglobin A1C 7.1     Assessment & Plan:   Flu shot refused- had a pna shot sev year yr ago prob about 2015  Has had some congestion in her chest and sinuses - start mucinex DM.   Wants shingles vaccine as still has some irritation mos after outbreak - advised to wait 1 yr after outbreak,.  1. Type 2 diabetes mellitus with complication, without long-term current use of insulin ([place]) - DM doing MUCH better than expected considering her poor medication compliance. DM foot exam done today  2. Hypothyroidism, unspecified hypothyroidism type - tsh mildly elevated but pt has been missing a fair amount of the synthroid doses - had a 6 mo  supply only in the past 9 mos so cont on same dose for now and try to icnrease compliance  3. Essential hypertension   4. Hyperlipidemia LDL goal <70 - Needs lipid panel if fasting - is not but she agrees to come in for lab only visit for this.  Change lipitor to crestor if still not hat goal.  5. Polypharmacy   6. Sciatica,  right     Orders Placed This Encounter  Procedures  . Comprehensive metabolic panel  . TSH  . Microalbumin/Creatinine Ratio, Urine  . POCT glycosylated hemoglobin (Hb A1C)    Meds ordered this encounter  Medications  . Zoster Vaccine Live, PF, (ZOSTAVAX) 91478 UNT/0.65ML injection    Sig: Inject 19,400 Units into the skin once.    Dispense:  1 vial    Refill:  0  . traMADol (ULTRAM) 50 MG tablet    Sig: Take 1-2 tablets (50-100 mg total) by mouth every 8 (eight) hours as needed.    Dispense:  60 tablet    Refill:  1  . olmesartan-hydrochlorothiazide (BENICAR HCT) 20-12.5 MG tablet    Sig: Take 1 tablet by mouth daily. TAKE 1 TABLET BY MOUTH DAILY.    Dispense:  90 tablet    Refill:  3  . sitaGLIPtin-metformin (JANUMET) 50-1000 MG tablet    Sig: Take 1 tablet by mouth 2 (two) times daily with a meal.    Dispense:  180 tablet    Refill:  3  . meloxicam (MOBIC) 15 MG tablet    Sig: Take 1 tablet (15 mg total) by mouth daily as needed for pain.    Dispense:  90 tablet    Refill:  1  . levothyroxine (SYNTHROID, LEVOTHROID) 137 MCG tablet    Sig: Take 1 tablet (137 mcg total) by mouth daily before breakfast.    Dispense:  90 tablet    Refill:  1  . fenofibrate (TRICOR) 145 MG tablet    Sig: Take 1 tablet (145 mg total) by mouth daily.    Dispense:  90 tablet    Refill:  3  . cyclobenzaprine (FLEXERIL) 10 MG tablet    Sig: Take 0.5-1 tablets (5-10 mg total) by mouth 3 (three) times daily as needed for muscle spasms.    Dispense:  60 tablet    Refill:  1  . escitalopram (LEXAPRO) 10 MG tablet    Sig: Take 1 tablet (10 mg total) by mouth daily. TAKE 1  TABLET (10 MG TOTAL) BY MOUTH DAILY.    Dispense:  90 tablet    Refill:  3  . atorvastatin (LIPITOR) 80 MG tablet    Sig: Take 1 tablet (80 mg total) by mouth daily at 6 PM.    Dispense:  90 tablet    Refill:  3     Delman Cheadle, M.D.  [facility] [address] [place], [place] [address] [phone] phone [phone] fax  06/23/16 9:51 AM

## 2016-06-20 NOTE — Patient Instructions (Addendum)
  Come by 102 when you are fasting and get your lipids checked.   IF you received an x-ray today, you will receive an invoice from [facility]. Please contact [facility] at [phone] with questions or concerns regarding your invoice.   IF you received labwork today, you will receive an invoice from [employer]. Please contact [facility] at [phone] with questions or concerns regarding your invoice.   Our billing staff will not be able to assist you with questions regarding bills from these companies.  You will be contacted with the lab results as soon as they are available. The fastest way to get your results is to activate your My Chart account. Instructions are located on the last page of this paperwork. If you have not heard from [place] regarding the results in 2 weeks, please contact this office.     We recommend that you schedule a mammogram for breast cancer screening. Typically, you do not need a referral to do this. Please contact a local imaging center to schedule your mammogram.  [facility] - [phone]  *ask for the Radiology Department [facility]) - [phone] or [phone]  [facility] - [phone] [place] [phone] [facility] - [phone]  *ask for the [facility] - [phone]  *ask for the Radiology Department [facility] - [phone]  *ask for the [facility] - [phone]

## 2016-06-21 LAB — COMPREHENSIVE METABOLIC PANEL
ALBUMIN: 4.4 g/dL (ref 3.6–5.1)
ALK PHOS: 55 U/L (ref 33–130)
ALT: 39 U/L — ABNORMAL HIGH (ref 6–29)
AST: 51 U/L — AB (ref 10–35)
BILIRUBIN TOTAL: 0.7 mg/dL (ref 0.2–1.2)
BUN: 23 mg/dL (ref 7–25)
CALCIUM: 9.7 mg/dL (ref 8.6–10.4)
CO2: 26 mmol/L (ref 20–31)
CREATININE: 1.04 mg/dL (ref 0.50–1.05)
Chloride: 98 mmol/L (ref 98–110)
Glucose, Bld: 129 mg/dL — ABNORMAL HIGH (ref 65–99)
Potassium: 4.2 mmol/L (ref 3.5–5.3)
Sodium: 138 mmol/L (ref 135–146)
TOTAL PROTEIN: 7.1 g/dL (ref 6.1–8.1)

## 2016-06-21 LAB — TSH: TSH: 8.24 m[IU]/L — AB

## 2016-06-22 LAB — MICROALBUMIN / CREATININE URINE RATIO
CREATININE, URINE: 129 mg/dL (ref 20–320)
MICROALB/CREAT RATIO: 5 ug/mg{creat} (ref <30)
Microalb, Ur: 0.7 mg/dL

## 2016-07-03 ENCOUNTER — Other Ambulatory Visit: Payer: Self-pay | Admitting: Family Medicine

## 2016-08-14 ENCOUNTER — Other Ambulatory Visit (INDEPENDENT_AMBULATORY_CARE_PROVIDER_SITE_OTHER): Payer: 59 | Admitting: Physician Assistant

## 2016-08-14 DIAGNOSIS — E785 Hyperlipidemia, unspecified: Secondary | ICD-10-CM

## 2016-08-14 DIAGNOSIS — E118 Type 2 diabetes mellitus with unspecified complications: Secondary | ICD-10-CM | POA: Diagnosis not present

## 2016-08-14 DIAGNOSIS — Z79899 Other long term (current) drug therapy: Secondary | ICD-10-CM

## 2016-08-14 DIAGNOSIS — I10 Essential (primary) hypertension: Secondary | ICD-10-CM

## 2016-08-14 LAB — LIPID PANEL
CHOL/HDL RATIO: 5.2 ratio — AB (ref <5.0)
Cholesterol: 166 mg/dL (ref <200)
HDL: 32 mg/dL — AB (ref >50)
LDL CALC: 107 mg/dL — AB (ref <100)
Triglycerides: 136 mg/dL (ref <150)
VLDL: 27 mg/dL (ref <30)

## 2016-11-08 ENCOUNTER — Other Ambulatory Visit: Payer: Self-pay | Admitting: Family Medicine

## 2016-12-04 NOTE — Progress Notes (Signed)
Lab only 

## 2017-01-11 ENCOUNTER — Other Ambulatory Visit: Payer: Self-pay | Admitting: Family Medicine

## 2017-02-18 DIAGNOSIS — Z961 Presence of intraocular lens: Secondary | ICD-10-CM | POA: Diagnosis not present

## 2017-02-18 DIAGNOSIS — H5202 Hypermetropia, left eye: Secondary | ICD-10-CM | POA: Diagnosis not present

## 2017-02-18 DIAGNOSIS — H5211 Myopia, right eye: Secondary | ICD-10-CM | POA: Diagnosis not present

## 2017-02-24 ENCOUNTER — Encounter: Payer: Self-pay | Admitting: Family Medicine

## 2017-02-24 ENCOUNTER — Ambulatory Visit (INDEPENDENT_AMBULATORY_CARE_PROVIDER_SITE_OTHER): Payer: 59 | Admitting: Family Medicine

## 2017-02-24 VITALS — BP 99/55 | HR 56 | Temp 98.2°F | Resp 16 | Ht 64.0 in | Wt 227.6 lb

## 2017-02-24 DIAGNOSIS — E039 Hypothyroidism, unspecified: Secondary | ICD-10-CM | POA: Diagnosis not present

## 2017-02-24 DIAGNOSIS — E785 Hyperlipidemia, unspecified: Secondary | ICD-10-CM | POA: Diagnosis not present

## 2017-02-24 DIAGNOSIS — Z5181 Encounter for therapeutic drug level monitoring: Secondary | ICD-10-CM | POA: Diagnosis not present

## 2017-02-24 DIAGNOSIS — I10 Essential (primary) hypertension: Secondary | ICD-10-CM | POA: Diagnosis not present

## 2017-02-24 DIAGNOSIS — Z79899 Other long term (current) drug therapy: Secondary | ICD-10-CM | POA: Diagnosis not present

## 2017-02-24 DIAGNOSIS — F3342 Major depressive disorder, recurrent, in full remission: Secondary | ICD-10-CM

## 2017-02-24 DIAGNOSIS — E118 Type 2 diabetes mellitus with unspecified complications: Secondary | ICD-10-CM | POA: Diagnosis not present

## 2017-02-24 DIAGNOSIS — F39 Unspecified mood [affective] disorder: Secondary | ICD-10-CM

## 2017-02-24 DIAGNOSIS — M1990 Unspecified osteoarthritis, unspecified site: Secondary | ICD-10-CM

## 2017-02-24 MED ORDER — OLMESARTAN MEDOXOMIL 20 MG PO TABS: 20.0000 mg | ORAL_TABLET | Freq: Every day | ORAL | 1 refills | Status: DC

## 2017-02-24 MED ORDER — MELOXICAM 15 MG PO TABS: 15.0000 mg | ORAL_TABLET | Freq: Every day | ORAL | 1 refills | Status: DC | PRN

## 2017-02-24 MED ORDER — SITAGLIP PHOS-METFORMIN HCL ER 50-1000 MG PO TB24: 2.0000 | ORAL_TABLET | Freq: Every day | ORAL | 1 refills | Status: DC

## 2017-02-24 MED ORDER — ESCITALOPRAM OXALATE 10 MG PO TABS: 10.0000 mg | ORAL_TABLET | Freq: Every day | ORAL | 3 refills | Status: DC

## 2017-02-24 MED ORDER — TRAMADOL HCL 50 MG PO TABS: 50.0000 mg | ORAL_TABLET | Freq: Three times a day (TID) | ORAL | 1 refills | Status: DC | PRN

## 2017-02-24 MED ORDER — ATORVASTATIN CALCIUM 80 MG PO TABS: 80.0000 mg | ORAL_TABLET | Freq: Every day | ORAL | 3 refills | Status: DC

## 2017-02-24 MED ORDER — FENOFIBRATE 145 MG PO TABS: 145.0000 mg | ORAL_TABLET | Freq: Every day | ORAL | 3 refills | Status: DC

## 2017-02-24 NOTE — Progress Notes (Addendum)
Subjective:    Patient ID: Sue Baker, female    DOB: 1956/07/20, 61 y.o.   MRN: [id number] Chief Complaint  Patient presents with  . Medication Refill    Atorvastatin 80 mg, Lexapro 10 mg, Fenofibrate 145 mg, Levothyroxine 137 mcg, Benicar HCT 20-12.5 mg, Tramadol 50 mg, Janumet 50-1000 mg, Tramadol 50 mg    HPI  Type 2 diabetes mellitus with complication, without long-term current use of insulin (HCC) - 7.0 -> 7.1 today. On Janumet 50-1000 and asa. Saw optho at [facility] 05/20ish/18 - 2 wks ago, nml . Feet fine. Poor diet - lives alone and works all the time so often finds herself eating a fastfood hamburger in the car, no big desire to change.  Missing her meds fairly regularly but not sure why.  Not getting any exercise but feels her job is physically active. DM doing MUCH better than expected considering her poor medication compliance. Update DM eye exam Needs foot exam Lowest cbgs around 80-90 - no hypoglycemic sxs. Highs around 300s nml around 140s-160s.   Hypothyroidism, unspecified hypothyroidism type - Has been stable on levothyroxine 137. tsh mildly elevated but pt has been missing a fair amount of the synthroid doses - had a 6 mo supply only in the past 9 mos so cont on same dose for now and try to icnrease compliance  Essential hypertension - been very well controlled on benicar-hctz prior but bp has been running very low over the past sev mos.  THinking about going to see cardiologist as is more DOE and no stamina.  Gets dizzy when she bends down, sometimes .  Checks BP at home 1-2x/wk and is low a lot - at work her bp was 89/48  Hyperlipidemia LDL goal <70 - has been on maximal medical therapy of tricor and lipitor 80 but slowly improving. Transaminases have been slightly elevated. Needs lipid panel if fasting - is not but she agrees to come in for lab only visit for this.  Change lipitor to crestor if still not hat goal  Polypharmacy   Sciatica, right  - Meloxicam daily  with rare prn tramadol.  Currently sxs controlled.  Has triggered finger inl eft 4th finger.   Vit D def  Does well for a week then misses sev d and then good for sev d and then mix 1-2d.  Here with grandson [place] [employer] Does have the urinary urgency but noty frequen cy but not bothering her.     Objective:   Physical Exam       BP (!) 99/55   Pulse (!) 56   Temp 98.2 F (36.8 C) (Oral)   Resp 16   Ht 5\' 4"  (1.626 m)   Wt 227 lb 9.6 oz (103.2 kg)   SpO2 95%   BMI 39.07 kg/m     Assessment & Plan:   1. Hypothyroidism, unspecified type - refilled same dose of levothyroxine 137 mcg, tsh very elevated at 19 due to non-compliance (pt admits worse recently as 6 mo rx has lasted 9 mos). Encouraged compliance. Recheck in 3 mos.  2. Type 2 diabetes mellitus with complication, without long-term current use of insulin ([place])   3. Hyperlipidemia LDL goal <70   4. Morbid obesity ([place])   5. Medication monitoring encounter   6. Essential hypertension   7. Recurrent major depressive disorder, in full remission ([place])   8. Arthritis   9. Polypharmacy   10. Mood disorder ([place])     Orders  Placed This Encounter  Procedures  . Comprehensive metabolic panel  . TSH  . Microalbumin/Creatinine Ratio, Urine  . POCT glycosylated hemoglobin (Hb A1C)  . HM DIABETES FOOT EXAM    Meds ordered this encounter  Medications  . olmesartan (BENICAR) 20 MG tablet    Sig: Take 1 tablet (20 mg total) by mouth daily.    Dispense:  90 tablet    Refill:  1  . traMADol (ULTRAM) 50 MG tablet    Sig: Take 1-2 tablets (50-100 mg total) by mouth every 8 (eight) hours as needed.    Dispense:  60 tablet    Refill:  1  . meloxicam (MOBIC) 15 MG tablet    Sig: Take 1 tablet (15 mg total) by mouth daily as needed for pain.    Dispense:  90 tablet    Refill:  1  . atorvastatin (LIPITOR) 80 MG tablet    Sig: Take 1 tablet (80 mg total) by mouth daily at 6 PM.    Dispense:  90 tablet    Refill:   3  . escitalopram (LEXAPRO) 10 MG tablet    Sig: Take 1 tablet (10 mg total) by mouth daily. TAKE 1 TABLET (10 MG TOTAL) BY MOUTH DAILY.    Dispense:  90 tablet    Refill:  3  . fenofibrate (TRICOR) 145 MG tablet    Sig: Take 1 tablet (145 mg total) by mouth daily.    Dispense:  90 tablet    Refill:  3  . SitaGLIPtin-MetFORMIN HCl (JANUMET XR) 50-1000 MG TB24    Sig: Take 2 tablets by mouth daily.    Dispense:  180 tablet    Refill:  1     Delman Cheadle, M.D.  Primary Care at [facility] [address] [place], [place] [address] [phone] phone [phone] fax  02/26/17 11:56 PM

## 2017-02-24 NOTE — Patient Instructions (Addendum)
We recommend that you schedule a mammogram for breast cancer screening. Typically, you do not need a referral to do this. Please contact a local imaging center to schedule your mammogram.  [facility] - [phone]  *ask for the Radiology Department [facility] - [phone] or [phone]  [facility] - [phone] [facility] - [phone] [facility] - [phone]  *ask for the Radiology Department [facility] - [phone]  *ask for the Radiology Department [facility] - [phone]  *ask for the Mammography Department [facility] - [phone]     IF you received an x-ray today, you will receive an invoice from [facility]. Please contact [facility] at [phone] with questions or concerns regarding your invoice.   IF you received labwork today, you will receive an invoice from [facility]. Please contact [facility] at [phone] with questions or concerns regarding your invoice.   Our billing staff will not be able to assist you with questions regarding bills from these companies.  You will be contacted with the lab results as soon as they are available. The fastest way to get your results is to activate your My Chart account. Instructions are located on the last page of this paperwork. If you have not heard from us regarding the results in 2 weeks, please contact this office.      

## 2017-02-25 LAB — COMPREHENSIVE METABOLIC PANEL
A/G RATIO: 1.7 (ref 1.2–2.2)
ALT: 73 IU/L — ABNORMAL HIGH (ref 0–32)
AST: 57 IU/L — ABNORMAL HIGH (ref 0–40)
Albumin: 4.5 g/dL (ref 3.5–5.5)
Alkaline Phosphatase: 54 IU/L (ref 39–117)
BILIRUBIN TOTAL: 0.7 mg/dL (ref 0.0–1.2)
BUN/Creatinine Ratio: 20 (ref 9–23)
BUN: 18 mg/dL (ref 6–24)
CALCIUM: 9.4 mg/dL (ref 8.7–10.2)
CO2: 23 mmol/L (ref 18–29)
Chloride: 102 mmol/L (ref 96–106)
Creatinine, Ser: 0.89 mg/dL (ref 0.57–1.00)
GFR calc non Af Amer: 71 mL/min/{1.73_m2} (ref >59)
GFR, EST AFRICAN AMERICAN: 82 mL/min/{1.73_m2} (ref >59)
GLOBULIN, TOTAL: 2.6 g/dL (ref 1.5–4.5)
Glucose: 92 mg/dL (ref 65–99)
POTASSIUM: 3.9 mmol/L (ref 3.5–5.2)
Sodium: 140 mmol/L (ref 134–144)
TOTAL PROTEIN: 7.1 g/dL (ref 6.0–8.5)

## 2017-02-25 LAB — MICROALBUMIN / CREATININE URINE RATIO
Creatinine, Urine: 220.7 mg/dL
Microalb/Creat Ratio: 8.6 mg/g creat (ref 0.0–30.0)
Microalbumin, Urine: 19 ug/mL

## 2017-02-25 LAB — TSH: TSH: 19.48 u[IU]/mL — ABNORMAL HIGH (ref 0.450–4.500)

## 2017-02-27 DIAGNOSIS — E118 Type 2 diabetes mellitus with unspecified complications: Secondary | ICD-10-CM | POA: Diagnosis not present

## 2017-02-27 LAB — HEMOGLOBIN A1C
Est. average glucose Bld gHb Est-mCnc: 157 mg/dL
HEMOGLOBIN A1C: 7.1 % — AB (ref 4.8–5.6)

## 2017-02-27 MED ORDER — LEVOTHYROXINE SODIUM 137 MCG PO TABS: 137.0000 ug | ORAL_TABLET | Freq: Every day | ORAL | 1 refills | Status: DC

## 2017-02-27 NOTE — Addendum Note (Signed)
Addended by: Delman Cheadle on: 02/27/2017 12:22 AM   Modules accepted: Orders

## 2017-02-27 NOTE — Addendum Note (Signed)
Addended by: Gari Crown on: 02/27/2017 09:12 AM   Modules accepted: Orders

## 2017-06-25 DIAGNOSIS — M65341 Trigger finger, right ring finger: Secondary | ICD-10-CM | POA: Diagnosis not present

## 2017-06-25 DIAGNOSIS — M65342 Trigger finger, left ring finger: Secondary | ICD-10-CM | POA: Diagnosis not present

## 2017-07-09 ENCOUNTER — Telehealth: Payer: Self-pay

## 2017-07-09 NOTE — Telephone Encounter (Signed)
Called patient to review overdue health maintenance.  Advised patient of need for flu shot and mammogram.  I told her that the mammo Dr. Brigitte Pulse had ordered for her had expired, and I asked her if she would like to have another one ordered.  She was agreeable with this.  Will forward message to clinical message poole to be ordered since Dr. Brigitte Pulse is out of the office.

## 2017-07-11 DIAGNOSIS — M65342 Trigger finger, left ring finger: Secondary | ICD-10-CM | POA: Diagnosis not present

## 2017-07-11 DIAGNOSIS — M65341 Trigger finger, right ring finger: Secondary | ICD-10-CM | POA: Diagnosis not present

## 2017-07-14 DIAGNOSIS — M25774 Osteophyte, right foot: Secondary | ICD-10-CM | POA: Diagnosis not present

## 2017-07-14 DIAGNOSIS — L600 Ingrowing nail: Secondary | ICD-10-CM | POA: Diagnosis not present

## 2017-07-14 NOTE — Telephone Encounter (Signed)
Extended cancellation date on mammogram so now order is active again. Pt can call the imaging center to sched at her convenience.

## 2017-07-14 NOTE — Telephone Encounter (Signed)
Please advise 

## 2017-07-15 NOTE — Telephone Encounter (Signed)
Left message advising the patient that Dr. Brigitte Pulse has placed a new order for her mammogram, and she is able to call and schedule it at her convenience.

## 2017-08-29 ENCOUNTER — Encounter (HOSPITAL_COMMUNITY): Payer: Self-pay | Admitting: Family Medicine

## 2017-08-29 ENCOUNTER — Ambulatory Visit (HOSPITAL_COMMUNITY)
Admission: EM | Admit: 2017-08-29 | Discharge: 2017-08-29 | Disposition: A | Discharge status: Home / Self Care | Payer: 59 | Attending: Internal Medicine | Admitting: Internal Medicine

## 2017-08-29 DIAGNOSIS — J069 Acute upper respiratory infection, unspecified: Secondary | ICD-10-CM

## 2017-08-29 MED ORDER — CROMOLYN SODIUM 5.2 MG/ACT NA AERS: 1.0000 | INHALATION_SPRAY | Freq: Four times a day (QID) | NASAL | 12 refills | Status: DC

## 2017-08-29 MED ORDER — AMOXICILLIN 500 MG PO CAPS: 500.0000 mg | ORAL_CAPSULE | Freq: Two times a day (BID) | ORAL | 0 refills | Status: AC

## 2017-08-29 NOTE — ED Provider Notes (Signed)
[facility]    CSN: [id number] Arrival date & time: 08/29/17  1303     History   Chief Complaint Chief Complaint  Patient presents with  . Sore Throat  . Otalgia    HPI Sue Baker is a 61 y.o. female.   Sue Baker presents with complaints of worsening sore throat and ear pain which started approximately 5 days go. Left is greater than right ear. Congestion, mild productive cough. Without fevers, gi/gu complaints. No known ill contacts. Without shortness of breath. Pain to throat and ear 10/10. Has been taking alkaseltzer plus which has helped some with her congestion.   ROS per HPI.       Past Medical History:  Diagnosis Date  . Allergy   . Arthritis   . Cataract   . Diabetes mellitus without complication ([place])   . GERD (gastroesophageal reflux disease)   . Hypertension   . Thyroid disease     Patient Active Problem List   Diagnosis Date Noted  . Arthritis 01/06/2015  . Hyperlipidemia LDL goal <70 02/08/2014  . OSA on CPAP 02/08/2014  . Morbid obesity ([place]) 02/08/2014  . Depression 02/08/2014  . Osteoarthritis of hand 05/21/2013  . Diabetes mellitus ([place]) 09/29/2012  . Polypharmacy 09/29/2012  . Hypertension 09/29/2012  . Arthritis of both knees 09/29/2012  . Irregular menses 09/29/2012  . Hypothyroid 09/29/2012  . GERD (gastroesophageal reflux disease) 09/29/2012  . Mood disorder ([place]) 09/29/2012    Past Surgical History:  Procedure Laterality Date  . BREAST SURGERY    . CARPAL TUNNEL RELEASE    . EYE SURGERY    . KNEE SURGERY     left  . TUBAL LIGATION      OB History    No data available       Home Medications    Prior to Admission medications   Medication Sig Start Date End Date Taking? Authorizing Provider  amoxicillin (AMOXIL) 500 MG capsule Take 1 capsule (500 mg total) by mouth 2 (two) times daily for 7 days. 08/29/17 09/05/17  Zigmund Gottron, NP  aspirin 81 MG tablet Take 81 mg by mouth daily.    [provider]  atorvastatin (LIPITOR) 80 MG tablet Take 1 tablet (80 mg total) by mouth daily at 6 PM. 02/24/17   Shawnee Knapp, MD  cromolyn (NASALCROM) 5.2 MG/ACT nasal spray Place 1 spray into both nostrils 4 (four) times daily. 08/29/17   Zigmund Gottron, NP  cyclobenzaprine (FLEXERIL) 10 MG tablet Take 0.5-1 tablets (5-10 mg total) by mouth 3 (three) times daily as needed for muscle spasms. 06/20/16   Shawnee Knapp, MD  escitalopram (LEXAPRO) 10 MG tablet Take 1 tablet (10 mg total) by mouth daily. TAKE 1 TABLET (10 MG TOTAL) BY MOUTH DAILY. 02/24/17   Shawnee Knapp, MD  fenofibrate (TRICOR) 145 MG tablet Take 1 tablet (145 mg total) by mouth daily. 02/24/17   Shawnee Knapp, MD  levothyroxine (SYNTHROID, LEVOTHROID) 137 MCG tablet Take 1 tablet (137 mcg total) by mouth daily before breakfast. 02/27/17   Shawnee Knapp, MD  meloxicam (MOBIC) 15 MG tablet Take 1 tablet (15 mg total) by mouth daily as needed for pain. 02/24/17   Shawnee Knapp, MD  olmesartan (BENICAR) 20 MG tablet Take 1 tablet (20 mg total) by mouth daily. 02/24/17   Shawnee Knapp, MD  SitaGLIPtin-MetFORMIN HCl (JANUMET XR) 50-1000 MG TB24 Take 2 tablets by mouth daily. 02/24/17   Shawnee Knapp,  MD  traMADol (ULTRAM) 50 MG tablet Take 1-2 tablets (50-100 mg total) by mouth every 8 (eight) hours as needed. 02/24/17   Shawnee Knapp, MD    Family History Family History  Problem Relation Age of Onset  . Heart disease Father     Social History Social History   Tobacco Use  . Smoking status: Former Smoker    Last attempt to quit: 09/30/1999    Years since quitting: 17.9  . Smokeless tobacco: Never Used  Substance Use Topics  . Alcohol use: Yes    Comment: rarely  . Drug use: No     Allergies   Patient has no known allergies.   Review of Systems Review of Systems   Physical Exam Triage Vital Signs ED Triage Vitals  Enc Vitals Group     BP 08/29/17 1321 (!) 141/84     Pulse Rate 08/29/17 1321 65     Resp 08/29/17 1321 18     Temp 08/29/17 1321 98.3 F  (36.8 C)     Temp src --      SpO2 08/29/17 1321 97 %     Weight --      Height --      Head Circumference --      Peak Flow --      Pain Score 08/29/17 1320 10     Pain Loc --      Pain Edu? --      Excl. in [place]? --    No data found.  Updated Vital Signs BP (!) 141/84   Pulse 65   Temp 98.3 F (36.8 C)   Resp 18   SpO2 97%   Visual Acuity Right Eye Distance:   Left Eye Distance:   Bilateral Distance:    Right Eye Near:   Left Eye Near:    Bilateral Near:     Physical Exam  Constitutional: She is oriented to person, place, and time. She appears well-developed and well-nourished. No distress.  HENT:  Head: Normocephalic and atraumatic.  Right Ear: Tympanic membrane, external ear and ear canal normal.  Left Ear: External ear and ear canal normal. Tympanic membrane is erythematous. A middle ear effusion is present.  Nose: Nose normal.  Mouth/Throat: Uvula is midline and mucous membranes are normal. Posterior oropharyngeal erythema present. No tonsillar exudate.  Eyes: Conjunctivae and EOM are normal. Pupils are equal, round, and reactive to light.  Cardiovascular: Normal rate, regular rhythm and normal heart sounds.  Pulmonary/Chest: Effort normal and breath sounds normal.  Neurological: She is alert and oriented to person, place, and time.  Skin: Skin is warm and dry.     UC Treatments / Results  Labs (all labs ordered are listed, but only abnormal results are displayed) Labs Reviewed - No data to display  EKG  EKG Interpretation None       Radiology No results found.  Procedures Procedures (including critical care time)  Medications Ordered in UC Medications - No data to display   Initial Impression / Assessment and Plan / UC Course  I have reviewed the triage vital signs and the nursing notes.  Pertinent labs & imaging results that were available during my care of the patient were reviewed by me and considered in my medical decision making (see  chart for details).     Complete course of antibiotics. Push fluids to ensure adequate hydration and keep secretions thin.  Tylenol and/or ibuprofen as needed for pain or fevers.  May try  nasal spray to help with congestion and drainage. If symptoms worsen or do not improve in the next week to return to be seen or to follow up with PCP.  Patient verbalized understanding and agreeable to plan.    Final Clinical Impressions(s) / UC Diagnoses   Final diagnoses:  Upper respiratory tract infection, unspecified type    ED Discharge Orders        Ordered    amoxicillin (AMOXIL) 500 MG capsule  2 times daily     08/29/17 1328    cromolyn (NASALCROM) 5.2 MG/ACT nasal spray  4 times daily     08/29/17 1328       Controlled Substance Prescriptions [place] Controlled Substance Registry consulted? Not Applicable   Zigmund Gottron, NP 08/29/17 1333

## 2017-08-29 NOTE — ED Triage Notes (Signed)
Pt here for 4 days of sore throat and bilateral ear pain. C/O cough and congestion. Denies fevers, chills.

## 2017-08-29 NOTE — Discharge Instructions (Signed)
Push fluids to ensure adequate hydration and keep secretions thin.  Complete course of antibiotics. Tylenol and/or ibuprofen as needed for pain or fevers.  Nasal spray up to 4 times a day for congestion and ear pressure.  If symptoms worsen or do not improve in the next week to return to be seen or to follow up with PCP.

## 2017-10-29 ENCOUNTER — Other Ambulatory Visit: Payer: Self-pay | Admitting: Family Medicine

## 2017-10-29 NOTE — Telephone Encounter (Signed)
Requesting refill of tramadol  LOV  02/24/17  with Dr Brigitte Pulse  [facility] 02/24/17  #60  1 refill  To be filled at: [id number] - [place], [place]

## 2017-10-29 NOTE — Telephone Encounter (Signed)
Please advise. Pt was suppose to follow-up in 05/27/2017 but didn't.

## 2017-10-31 ENCOUNTER — Other Ambulatory Visit: Payer: Self-pay | Admitting: Family Medicine

## 2017-10-31 ENCOUNTER — Telehealth: Payer: Self-pay | Admitting: Family Medicine

## 2017-10-31 DIAGNOSIS — M5431 Sciatica, right side: Secondary | ICD-10-CM

## 2017-10-31 MED ORDER — CYCLOBENZAPRINE HCL 10 MG PO TABS: 5.0000 mg | ORAL_TABLET | Freq: Three times a day (TID) | ORAL | 0 refills | Status: DC | PRN

## 2017-10-31 NOTE — Telephone Encounter (Signed)
Called - mailbox was full. I put in a phone message for the [facility] to let her know if she calls back that her RX is ready at the pharmacy but she will need an OV before any further refills.  Thanks!

## 2017-10-31 NOTE — Telephone Encounter (Signed)
Called pt to let them know that their RX has been approved and is at her pharmacy. She will need an OV for any additional refills.   If pt calls in, please let her know that her RX is at the pharmacy and she needs to make an OV with Dr. Brigitte Pulse for any further refills.  Thanks!

## 2017-10-31 NOTE — Telephone Encounter (Signed)
Needs Office visit for any additional refills.

## 2017-11-03 NOTE — Telephone Encounter (Signed)
Prescription went as "print" did not get e-prescribed to pharmacy. Called in [facility] [address], spoke with [place].

## 2017-11-17 ENCOUNTER — Encounter: Payer: Self-pay | Admitting: Family Medicine

## 2017-11-17 ENCOUNTER — Ambulatory Visit: Payer: 59 | Admitting: Family Medicine

## 2017-11-17 ENCOUNTER — Other Ambulatory Visit: Payer: Self-pay

## 2017-11-17 VITALS — BP 108/62 | HR 80 | Temp 98.7°F | Resp 16 | Ht 64.0 in | Wt 235.0 lb

## 2017-11-17 DIAGNOSIS — M19041 Primary osteoarthritis, right hand: Secondary | ICD-10-CM

## 2017-11-17 DIAGNOSIS — G629 Polyneuropathy, unspecified: Secondary | ICD-10-CM | POA: Diagnosis not present

## 2017-11-17 DIAGNOSIS — E785 Hyperlipidemia, unspecified: Secondary | ICD-10-CM

## 2017-11-17 DIAGNOSIS — H04203 Unspecified epiphora, bilateral lacrimal glands: Secondary | ICD-10-CM

## 2017-11-17 DIAGNOSIS — Z9114 Patient's other noncompliance with medication regimen: Secondary | ICD-10-CM

## 2017-11-17 DIAGNOSIS — I10 Essential (primary) hypertension: Secondary | ICD-10-CM

## 2017-11-17 DIAGNOSIS — H11003 Unspecified pterygium of eye, bilateral: Secondary | ICD-10-CM

## 2017-11-17 DIAGNOSIS — M5431 Sciatica, right side: Secondary | ICD-10-CM | POA: Diagnosis not present

## 2017-11-17 DIAGNOSIS — E118 Type 2 diabetes mellitus with unspecified complications: Secondary | ICD-10-CM | POA: Diagnosis not present

## 2017-11-17 DIAGNOSIS — E039 Hypothyroidism, unspecified: Secondary | ICD-10-CM | POA: Diagnosis not present

## 2017-11-17 DIAGNOSIS — M19042 Primary osteoarthritis, left hand: Secondary | ICD-10-CM | POA: Diagnosis not present

## 2017-11-17 LAB — POCT GLYCOSYLATED HEMOGLOBIN (HGB A1C): HEMOGLOBIN A1C: 9.4

## 2017-11-17 MED ORDER — ROSUVASTATIN CALCIUM 40 MG PO TABS: 40.0000 mg | ORAL_TABLET | Freq: Every day | ORAL | 3 refills | Status: DC

## 2017-11-17 MED ORDER — FENOFIBRATE 145 MG PO TABS: 145.0000 mg | ORAL_TABLET | Freq: Every day | ORAL | 1 refills | Status: DC

## 2017-11-17 MED ORDER — MELOXICAM 15 MG PO TABS: 15.0000 mg | ORAL_TABLET | Freq: Every day | ORAL | 1 refills | Status: DC | PRN

## 2017-11-17 MED ORDER — CYCLOSPORINE 0.05 % OP EMUL: 1.0000 [drp] | Freq: Two times a day (BID) | OPHTHALMIC | 2 refills | Status: DC

## 2017-11-17 MED ORDER — OLMESARTAN MEDOXOMIL 20 MG PO TABS: 20.0000 mg | ORAL_TABLET | Freq: Every day | ORAL | 1 refills | Status: DC

## 2017-11-17 MED ORDER — SAXAGLIPTIN-METFORMIN ER 2.5-1000 MG PO TB24: 2.0000 | ORAL_TABLET | Freq: Every day | ORAL | 3 refills | Status: DC

## 2017-11-17 MED ORDER — LEVOTHYROXINE SODIUM 150 MCG PO TABS: 150.0000 ug | ORAL_TABLET | Freq: Every day | ORAL | 0 refills | Status: DC

## 2017-11-17 NOTE — Progress Notes (Signed)
Subjective:    Patient ID: Sue Baker, female    DOB: 1956-04-20, 62 y.o.   MRN: [id number] Chief Complaint  Patient presents with  . Medication Refill    all except she needs a different medication for Lipitor and Janumet     HPI Eyes watering a lot, waking up crusty, no itchy or scratch, no eye pain, right lid just started to become a little sore but not sure if that is from wiping it a lot.  No sinus sxs, no URIs.  Is affecting her vision but she thinks it is just the watering/drainage that is doing that. Has not made appt w/ eye doctor - trying to wait until annual exam so was hoping I could help her with it today.  Type 2 diabetes mellitus with complication, without long-term current use of insulin ([facility]) -  On Janumet XR 50-1000 2 tabs qd and asa. Saw optho at [facility] 05/20ish/18 -nml. Feet fine but Rt leg has become numb - knows it is not her sciatica as no pain, no back paion. Poor diet - works all the time so often finds herself eating a fastfood hamburger in the car, no big desire to change.  Missing her meds fairly regularly - will do great on taking them from her med box but she can never remember to fill her med box up and then forgets so will be on for sev wks, then off for sev wks.  Her adult daughter is now living w/ her so she has thought about asking her to fill it up for her.  Not getting any exercise but feels her job is physically active. No hypoglycemic sxs. Highs around 300s nml around 160s but hasn't been checking recently.  Has been feeling very thirst for water - guzzling it. Stopped drinking pepsis. +urinary urgency due to water but doesn't bother her. Appetite unchanged.  Hypothyroidism, unspecified hypothyroidism type - On levothyroxine 137 for several years. tsh mildly elevated and then very high at last visit because pt has been missing a half of the the synthroid doses - had a 6 mo supply only in the past 9 mos and still has quite a bit of it left.  Essential  hypertension - Checks BP at home 1-2x/wk and was low a lot - at work her bp was 89/48 at last visit so we took out the hctz portion from her olmesartan and reports the olmesartan alone has been working much better for her.  No hypotensive sxs, no dizziness/lightheadedness w/ position change or bending over. BP still very well controlled.   Hyperlipidemia LDL goal <70 - has been on maximal medical therapy of tricor and lipitor 80 but slowly improving. Transaminases have been slightly elevated. Not fasting today.   Sciatica, right  - Meloxicam daily with rare prn tramadol.  Currently no sxs -  controlled.  Stiffness in hands in bad and the meloxicam helps a lot with this. Had trigger finger injections but effect wore off very quickly  Past Medical History:  Diagnosis Date  . Allergy   . Arthritis   . Cataract   . Diabetes mellitus without complication ([place])   . GERD (gastroesophageal reflux disease)   . Hypertension   . Thyroid disease    Past Surgical History:  Procedure Laterality Date  . BREAST SURGERY    . CARPAL TUNNEL RELEASE    . EYE SURGERY    . KNEE SURGERY     left  . TUBAL LIGATION  Current Outpatient Medications on File Prior to Visit  Medication Sig Dispense Refill  . aspirin 81 MG tablet Take 81 mg by mouth daily.    . cyclobenzaprine (FLEXERIL) 10 MG tablet Take 0.5-1 tablets (5-10 mg total) by mouth 3 (three) times daily as needed for muscle spasms. **Needs office visit for any additional refills** 30 tablet 0  . escitalopram (LEXAPRO) 10 MG tablet Take 1 tablet (10 mg total) by mouth daily. TAKE 1 TABLET (10 MG TOTAL) BY MOUTH DAILY. 90 tablet 3  . traMADol (ULTRAM) 50 MG tablet Take 1-2 tablets (50-100 mg total) by mouth every 8 (eight) hours as needed. **Needs office visit for any additional refills.** 30 tablet 0   No current facility-administered medications on file prior to visit.    No Known Allergies Family History  Problem Relation Age of Onset  .  Heart disease Father    Social History   Socioeconomic History  . Marital status: Single    Spouse name: None  . Number of children: None  . Years of education: None  . Highest education level: None  Social Needs  . Financial resource strain: None  . Food insecurity - worry: None  . Food insecurity - inability: None  . Transportation needs - medical: None  . Transportation needs - non-medical: None  Occupational History  . None  Tobacco Use  . Smoking status: Former Smoker    Last attempt to quit: 09/30/1999    Years since quitting: 18.1  . Smokeless tobacco: Never Used  Substance and Sexual Activity  . Alcohol use: Yes    Comment: rarely  . Drug use: No  . Sexual activity: No  Other Topics Concern  . None  Social History Narrative  . None   Depression screen [facility] 2/9 11/17/2017 02/24/2017 06/20/2016 09/14/2015 06/17/2015  Decreased Interest 0 0 0 0 0  Down, Depressed, Hopeless 0 0 0 0 0  PHQ - 2 Score 0 0 0 0 0     Review of Systems  Constitutional: Negative for activity change, appetite change, chills, diaphoresis and fever.  HENT: Negative for congestion, facial swelling, postnasal drip, rhinorrhea, sinus pressure, sinus pain, sneezing and tinnitus.   Eyes: Positive for discharge and visual disturbance. Negative for photophobia, pain and itching.  Cardiovascular: Negative for leg swelling.  Endocrine: Positive for polydipsia and polyuria. Negative for polyphagia.  Genitourinary: Positive for urgency. Negative for decreased urine volume, difficulty urinating, dysuria, flank pain, frequency, hematuria and vaginal bleeding.  Musculoskeletal: Negative for back pain and myalgias.  Neurological: Positive for numbness. Negative for weakness.  Psychiatric/Behavioral: Negative for dysphoric mood.  Does have the urinary urgency but not frequency and not bothering her.     Objective:   Physical Exam  Constitutional: She is oriented to person, place, and time. She appears  well-developed and well-nourished. No distress.  Morbidly obese  HENT:  Head: Normocephalic and atraumatic.  Right Ear: Tympanic membrane, external ear and ear canal normal.  Left Ear: Tympanic membrane, external ear and ear canal normal.  Nose: Nose normal.  Mouth/Throat: Uvula is midline and mucous membranes are normal.  Eyes: EOM and lids are normal. Pupils are equal, round, and reactive to light. Right eye exhibits no chemosis, no discharge and no exudate. Left eye exhibits no chemosis, no discharge and no exudate. Right conjunctiva is injected (very slight, worst on medial aspect). Left conjunctiva is injected (very slight, worst on medial aspect). No scleral icterus.  Fundoscopic exam:  The right eye shows no exudate and no hemorrhage.       The left eye shows no exudate and no hemorrhage.  Small Bilateral pterygium just crossing over both medial and lateral outer rim of iris  Neck: Normal range of motion. Neck supple. No thyromegaly present.  Large neck impaired adequate exm  Cardiovascular: Normal rate, regular rhythm, normal heart sounds and intact distal pulses.  Pulmonary/Chest: Effort normal and breath sounds normal. No respiratory distress.  Musculoskeletal: She exhibits no edema.  Lymphadenopathy:       Right: No supraclavicular adenopathy present.       Left: No supraclavicular adenopathy present.  Neurological: She is alert and oriented to person, place, and time.  Skin: Skin is warm and dry. She is not diaphoretic. No erythema.  Psychiatric: She has a normal mood and affect. Her behavior is normal.        BP 108/62   Pulse 80   Temp 98.7 F (37.1 C)   Resp 16   Ht 5\' 4"  (1.626 m)   Wt 235 lb (106.6 kg)   SpO2 96%   BMI 40.34 kg/m   Results for orders placed or performed in visit on 11/17/17  POCT glycosylated hemoglobin (Hb A1C)  Result Value Ref Range   Hemoglobin A1C 9.4     Assessment & Plan:  optho  1. Type 2 diabetes mellitus with complication,  without long-term current use of insulin ([place]) - seen at [facility] for DM eye exam ~02/2017 nml.  Janumet XR 50-1000 2 tabs qd no longer on formulary. DM uncontrolled due med noncompliance - pt brought list of formulary options - will change to Kombiglyze XR also max dose as def needs XR due to compliance so that is pt's best equivalent option on her formulary.  2. Hypothyroidism, unspecified type - has been profoundly hypothyroid for > a year as only taking levothyroxine ~60% of time at most - was prev well controlled on levothyroxine 137 when she was compliant but as that clearly is not going to happen overnight and pt has gained weight, will try increasing dose to 150 - maybe a higher dose will help cover for some of the missed days. Importance of compliance for her body to be able to function again explained to pt.  3. Essential hypertension - no sxs of hypotension, cont olmesartan 20.  4. Hyperlipidemia LDL goal <70 - on atorvastatin 80 but pt reported letter from insurance stating this is no longer preferred on therapy - only med that is equivalent is crestor 40 so will change to that but all other chol meds are not an option as less potent and pt's lipids are NOT at goal - if insurance does not approve crestor 40, will need a PA for the atorvastatin 80. Cont tricor. Not fasting today.  5. Peripheral polyneuropathy - did not have time to fully evaluate today due to numerous concerns - I think this is her left lower ext only below knee - advised could be worsening uncontrolled DM, uncontrolled hypothyroid, or from lumbar DDD/radiculopathy as well as other things, check B12 but as long as no pain or weakness, recommend first controlled sugars and replacing thyroid to see if that will resolve sxs - if sxs cont or worsen, RTC for visit to eval this - likely need updated L-spine films if cont and may need to cons EMG/NCV after more thorough exam.  6. Pterygium eye, bilateral - very mild  7. Noncompliance  w/medication treatment due to intermit  use of medication -takes meds between 50-75% of the time. All of pt's meds were in 6 mos supply which was rx'd to her >9 mos ago she she reports she still has sev wks left of them at least.  has med box which works when full but she forgets to fill - will ask 24 yo daughter who lives w/ her to do it for her weekly  8.      Bilateral epiphora - suspect dry eye - pt w/ not enough compliance to use otc artificial tears to effects so try restasis bid. If no improvement in sxs in 1-2 mos or worsening at all, needs to see ophtho 9.      Primary osteoarthritis of both hands - dependent upon daily mobic. Seeing hand surgery prn.   Declines need for tramadol or flexeril refill for now - uses very sparingly and just go sev wks ago so she thinks that will last her > 3 mos. For sciatic flairs.  Orders Placed This Encounter  Procedures  . TSH+T4F+T3Free  . Comprehensive metabolic panel  . LDL Cholesterol, Direct  . Vitamin B12  . LDL cholesterol, direct  . Vitamin B12  . POCT glycosylated hemoglobin (Hb A1C)    Meds ordered this encounter  Medications  . cycloSPORINE (RESTASIS) 0.05 % ophthalmic emulsion    Sig: Place 1 drop into both eyes 2 (two) times daily.    Dispense:  0.4 mL    Refill:  2  . rosuvastatin (CRESTOR) 40 MG tablet    Sig: Take 1 tablet (40 mg total) by mouth daily.    Dispense:  90 tablet    Refill:  3  . fenofibrate (TRICOR) 145 MG tablet    Sig: Take 1 tablet (145 mg total) by mouth daily.    Dispense:  90 tablet    Refill:  1  . levothyroxine (SYNTHROID, LEVOTHROID) 150 MCG tablet    Sig: Take 1 tablet (150 mcg total) by mouth daily before breakfast.    Dispense:  90 tablet    Refill:  0  . Saxagliptin-Metformin (KOMBIGLYZE XR) 2.01-999 MG TB24    Sig: Take 2 tablets by mouth daily.    Dispense:  180 tablet    Refill:  3  . meloxicam (MOBIC) 15 MG tablet    Sig: Take 1 tablet (15 mg total) by mouth daily as needed for pain.     Dispense:  90 tablet    Refill:  1  . olmesartan (BENICAR) 20 MG tablet    Sig: Take 1 tablet (20 mg total) by mouth daily.    Dispense:  90 tablet    Refill:  1    Delman Cheadle, M.D.  Primary Care at [facility] [address] [place], [place] [address] [phone] phone [phone] fax  11/17/17 1:44 PM

## 2017-11-17 NOTE — Patient Instructions (Addendum)
Dry Eye Dry eye, also called keratoconjunctivitis sicca, is dryness of the membranes surrounding the eye. It happens when there are not enough healthy, natural tears in the eyes. The eyes must remain moist at all times. A small amount of tears is constantly produced by the tear glands (lacrimal glands). These glands are located under the outside part of the upper eyelids. Dryness of the eyes can be a symptom of a variety of conditions, such as rheumatoid arthritis, lupus, or Sjgren syndrome. Dry eye may be mild to severe. What are the causes? This condition may be caused by:  Not making enough tears (aqueous tear-deficient dry eyes).  Tears evaporating from the eye too quickly (evaporative dry eyes). This is when there is an abnormality in the quality of your tears. This abnormality causes your tears to evaporate so quickly that the eye cannot be kept moist.  What increases the risk? This condition is more likely to happen in:  Women, especially those who have gone through menopause.  People in dry climates.  People in dusty or smoky areas.  People who take certain medicines, such as: ? Anti-allergy medicines (antihistamines). ? Blood pressure medicines (antihypertensives). ? Birth control pills (oral contraceptives). ? Laxatives. ? Tranquilizers.  What are the signs or symptoms? Symptoms in the eyes may include:  Irritation.  Itchiness.  Redness.  Burning.  Inflammation of the eyelids.  Feeling as though something is stuck in the eye.  Light sensitivity.  Increased sensitivity and discomfort when wearing contact lenses, if this applies.  Vision that varies throughout the day.  Occasional excessive tearing.  How is this diagnosed? This condition is diagnosed based on your symptoms, your medical history, and an eye exam. Your health care provider may look at your eye using a microscope and may put dyes in your eye to check the health of the surface of your eye. You  may have a tests, such as a test to evaluate your tear production (Schirmer test). During this test, a small strip of special paper is gently pressed into the inner corner of your eye. Your tear production is measured by how much of the paper is moistened by your tears during a set amount of time. You may be referred to a health care provider who specializes in eyes and eyesight (ophthalmologist). How is this treated? This condition is often treated at home. Your health care provider may recommend eye drops, which are also called artificial tears. If your condition is severe, treatment may include:  Prescription eye drops.  Over-the-counter or prescription ointments to moisten your eyes.  Minor surgery to block tears from going into your nose.  Medicines to reduce inflammation of the eyelids.  Follow these instructions at home:  Take, use, or apply over-the-counter and prescription medicines only as told by your health care provider. This includes eye drops.  If directed, apply a warm compress to your eyes to help reduce inflammation. Place a towel over your eyes and gently press the warm compress over your eyes for about 5 minutes, or as long as told by your health care provider.  If possible, avoid dry, drafty environments.  Use a humidifier at home to increase moisture in the air.  If you wear contact lenses, remove them regularly to give your eyes a break. Always remove contacts before sleeping.  Keep all follow-up visits as told by your health care provider. This is important. This includes yearly eye exams and vision tests. Contact a health care provider if:    You have eye pain.  You have pus-like fluid coming from your eye.  Your symptoms get worse or do not improve with treatment. Get help right away if:  Your vision suddenly changes. This information is not intended to replace advice given to you by your health care provider. Make sure you discuss any questions you have  with your health care provider. Document Released: 07/27/2004 Document Revised: 02/15/2016 Document Reviewed: 07/05/2015 Elsevier Interactive Patient Education  2018 [employer].   Peripheral Neuropathy Peripheral neuropathy is a type of nerve damage. It affects nerves that carry signals between the spinal cord and other parts of the body. These are called peripheral nerves. With peripheral neuropathy, one nerve or a group of nerves may be damaged. What are the causes? Many things can damage peripheral nerves. For some people with peripheral neuropathy, the cause is unknown. Some causes include:  Diabetes. This is the most common cause of peripheral neuropathy.  Injury to a nerve.  Pressure or stress on a nerve that lasts a long time.  Too little vitamin B. Alcoholism can lead to this.  Infections.  Autoimmune diseases, such as multiple sclerosis and systemic lupus erythematosus.  Inherited nerve diseases.  Some medicines, such as cancer drugs.  Toxic substances, such as lead and mercury.  Too little blood flowing to the legs.  Kidney disease.  Thyroid disease.  What are the signs or symptoms? Different people have different symptoms. The symptoms you have will depend on which of your nerves is damaged. Common symptoms include:  Loss of feeling (numbness) in the feet and hands.  Tingling in the feet and hands.  Pain that burns.  Very sensitive skin.  Weakness.  Not being able to move a part of the body (paralysis).  Muscle twitching.  Clumsiness or poor coordination.  Loss of balance.  Not being able to control your bladder.  Feeling dizzy.  Sexual problems.  How is this diagnosed? Peripheral neuropathy is a symptom, not a disease. Finding the cause of peripheral neuropathy can be hard. To figure that out, your health care provider will take a medical history and do a physical exam. A neurological exam will also be done. This involves checking things  affected by your brain, spinal cord, and nerves (nervous system). For example, your health care provider will check your reflexes, how you move, and what you can feel. Other types of tests may also be ordered, such as:  Blood tests.  A test of the fluid in your spinal cord.  Imaging tests, such as CT scans or an MRI.  Electromyography (EMG). This test checks the nerves that control muscles.  Nerve conduction velocity tests. These tests check how fast messages pass through your nerves.  Nerve biopsy. A small piece of nerve is removed. It is then checked under a microscope.  How is this treated?  Medicine is often used to treat peripheral neuropathy. Medicines may include: ? Pain-relieving medicines. Prescription or over-the-counter medicine may be suggested. ? Antiseizure medicine. This may be used for pain. ? Antidepressants. These also may help ease pain from neuropathy. ? Lidocaine. This is a numbing medicine. You might wear a patch or be given a shot. ? Mexiletine. This medicine is typically used to help control irregular heart rhythms.  Surgery. Surgery may be needed to relieve pressure on a nerve or to destroy a nerve that is causing pain.  Physical therapy to help movement.  Assistive devices to help movement. Follow these instructions at home:  Only take  over-the-counter or prescription medicines as directed by your health care provider. Follow the instructions carefully for any given medicines. Do not take any other medicines without first getting approval from your health care provider.  If you have diabetes, work closely with your health care provider to keep your blood sugar under control.  If you have numbness in your feet: ? Check every day for signs of injury or infection. Watch for redness, warmth, and swelling. ? Wear padded socks and comfortable shoes. These help protect your feet.  Do not do things that put pressure on your damaged nerve.  Do not smoke.  Smoking keeps blood from getting to damaged nerves.  Avoid or limit alcohol. Too much alcohol can cause a lack of B vitamins. These vitamins are needed for healthy nerves.  Develop a good support system. Coping with peripheral neuropathy can be stressful. Talk to a mental health specialist or join a support group if you are struggling.  Follow up with your health care provider as directed. Contact a health care provider if:  You have new signs or symptoms of peripheral neuropathy.  You are struggling emotionally from dealing with peripheral neuropathy.  You have a fever. Get help right away if:  You have an injury or infection that is not healing.  You feel very dizzy or begin vomiting.  You have chest pain.  You have trouble breathing. This information is not intended to replace advice given to you by your health care provider. Make sure you discuss any questions you have with your health care provider. Document Released: 08/30/2002 Document Revised: 02/15/2016 Document Reviewed: 05/17/2013 Elsevier Interactive Patient Education  2017 [employer].

## 2017-11-18 ENCOUNTER — Encounter: Payer: Self-pay | Admitting: Family Medicine

## 2017-11-18 DIAGNOSIS — M5431 Sciatica, right side: Secondary | ICD-10-CM | POA: Insufficient documentation

## 2017-11-18 DIAGNOSIS — Z9114 Patient's other noncompliance with medication regimen: Secondary | ICD-10-CM | POA: Insufficient documentation

## 2017-11-18 LAB — COMPREHENSIVE METABOLIC PANEL
A/G RATIO: 1.6 (ref 1.2–2.2)
ALBUMIN: 4.7 g/dL (ref 3.6–4.8)
ALT: 61 IU/L — ABNORMAL HIGH (ref 0–32)
AST: 64 IU/L — ABNORMAL HIGH (ref 0–40)
Alkaline Phosphatase: 78 IU/L (ref 39–117)
BILIRUBIN TOTAL: 0.5 mg/dL (ref 0.0–1.2)
BUN / CREAT RATIO: 11 — AB (ref 12–28)
BUN: 10 mg/dL (ref 8–27)
CHLORIDE: 100 mmol/L (ref 96–106)
CO2: 24 mmol/L (ref 20–29)
Calcium: 10.1 mg/dL (ref 8.7–10.3)
Creatinine, Ser: 0.9 mg/dL (ref 0.57–1.00)
GFR calc non Af Amer: 70 mL/min/{1.73_m2} (ref >59)
GFR, EST AFRICAN AMERICAN: 80 mL/min/{1.73_m2} (ref >59)
Globulin, Total: 2.9 g/dL (ref 1.5–4.5)
Glucose: 224 mg/dL — ABNORMAL HIGH (ref 65–99)
POTASSIUM: 4.3 mmol/L (ref 3.5–5.2)
SODIUM: 140 mmol/L (ref 134–144)
TOTAL PROTEIN: 7.6 g/dL (ref 6.0–8.5)

## 2017-11-18 LAB — LDL CHOLESTEROL, DIRECT: LDL Direct: 153 mg/dL — ABNORMAL HIGH (ref 0–99)

## 2017-11-18 LAB — TSH+T4F+T3FREE
FREE T4: 0.97 ng/dL (ref 0.82–1.77)
T3, Free: 1.8 pg/mL — ABNORMAL LOW (ref 2.0–4.4)
TSH: 33.25 u[IU]/mL — AB (ref 0.450–4.500)

## 2017-11-18 LAB — VITAMIN B12: Vitamin B-12: 598 pg/mL (ref 232–1245)

## 2018-01-06 DIAGNOSIS — H26499 Other secondary cataract, unspecified eye: Secondary | ICD-10-CM | POA: Diagnosis not present

## 2018-01-06 DIAGNOSIS — H04129 Dry eye syndrome of unspecified lacrimal gland: Secondary | ICD-10-CM | POA: Diagnosis not present

## 2018-01-06 LAB — HM DIABETES EYE EXAM

## 2018-01-20 DIAGNOSIS — H26492 Other secondary cataract, left eye: Secondary | ICD-10-CM | POA: Diagnosis not present

## 2018-01-20 DIAGNOSIS — H02831 Dermatochalasis of right upper eyelid: Secondary | ICD-10-CM | POA: Diagnosis not present

## 2018-01-20 DIAGNOSIS — H04123 Dry eye syndrome of bilateral lacrimal glands: Secondary | ICD-10-CM | POA: Diagnosis not present

## 2018-01-28 ENCOUNTER — Encounter: Payer: Self-pay | Admitting: Cardiovascular Disease

## 2018-01-28 ENCOUNTER — Ambulatory Visit: Payer: 59 | Admitting: Cardiovascular Disease

## 2018-01-28 VITALS — BP 103/61 | HR 89 | Ht 64.0 in | Wt 228.0 lb

## 2018-01-28 DIAGNOSIS — E785 Hyperlipidemia, unspecified: Secondary | ICD-10-CM

## 2018-01-28 DIAGNOSIS — R0609 Other forms of dyspnea: Secondary | ICD-10-CM

## 2018-01-28 DIAGNOSIS — I10 Essential (primary) hypertension: Secondary | ICD-10-CM | POA: Diagnosis not present

## 2018-01-28 DIAGNOSIS — E039 Hypothyroidism, unspecified: Secondary | ICD-10-CM | POA: Diagnosis not present

## 2018-01-28 DIAGNOSIS — R200 Anesthesia of skin: Secondary | ICD-10-CM

## 2018-01-28 DIAGNOSIS — Z79899 Other long term (current) drug therapy: Secondary | ICD-10-CM | POA: Diagnosis not present

## 2018-01-28 DIAGNOSIS — E118 Type 2 diabetes mellitus with unspecified complications: Secondary | ICD-10-CM

## 2018-01-28 DIAGNOSIS — Z01812 Encounter for preprocedural laboratory examination: Secondary | ICD-10-CM | POA: Diagnosis not present

## 2018-01-28 MED ORDER — METOPROLOL TARTRATE 50 MG PO TABS: ORAL_TABLET | ORAL | 0 refills | Status: DC

## 2018-01-28 NOTE — Progress Notes (Signed)
Cardiology Office Note    Date:  01/30/2018   ID:  Evangeline Utley, DOB Feb 25, 1956, MRN [id number]  PCP:  Shawnee Knapp, MD  Cardiologist:  Shelva Majestic, MD   Chief Complaint  Patient presents with  . New Patient (Initial Visit)  Cardiology evaluation, referred through the courtesy of Dr. Delman Cheadle  History of Present Illness:  Zara Wendt is a 62 y.o. female who presents to the office today for cardiology evaluation.  I had seen her for years previously in May 2015.  She is originally from [place].  She moved here with [name] when she was transferred to the [place] area.  Has a long-standing history of hypertension, diabetes mellitus for at least 6 years, hyperlipidemia, and has a diagnosis of obstructive sleep apnea for almost 20 years.  She received a new CPAP machine in 2015.  She continues to work.  She has had difficulty with right leg numbness and also has noticed some shortness of breath with activity.  She denies any definitive lower extremity pain with walking but admits to numbness.   She has difficulty with knee discomfort.  She has noticed episodes of some stress mediated chest pressure but denies any clear-cut exertional precipitation.  Her last stress test was in [place], [place] at least 8 years ago.  She presents.for reestablishment of cardiology and sleep care.   Past Medical History:  Diagnosis Date  . Allergy   . Arthritis   . Cataract   . Diabetes mellitus without complication ([place])   . GERD (gastroesophageal reflux disease)   . Hypertension   . Thyroid disease     Past Surgical History:  Procedure Laterality Date  . BREAST SURGERY    . CARPAL TUNNEL RELEASE    . EYE SURGERY    . KNEE SURGERY     left  . TUBAL LIGATION      Current Medications: Outpatient Medications Prior to Visit  Medication Sig Dispense Refill  . aspirin 81 MG tablet Take 81 mg by mouth daily.    . cyclobenzaprine (FLEXERIL) 10 MG tablet Take 0.5-1 tablets (5-10 mg total) by  mouth 3 (three) times daily as needed for muscle spasms. **Needs office visit for any additional refills** 30 tablet 0  . escitalopram (LEXAPRO) 10 MG tablet Take 1 tablet (10 mg total) by mouth daily. TAKE 1 TABLET (10 MG TOTAL) BY MOUTH DAILY. 90 tablet 3  . fenofibrate (TRICOR) 145 MG tablet Take 1 tablet (145 mg total) by mouth daily. 90 tablet 1  . levothyroxine (SYNTHROID, LEVOTHROID) 150 MCG tablet Take 1 tablet (150 mcg total) by mouth daily before breakfast. 90 tablet 0  . meloxicam (MOBIC) 15 MG tablet Take 1 tablet (15 mg total) by mouth daily as needed for pain. 90 tablet 1  . olmesartan (BENICAR) 20 MG tablet Take 1 tablet (20 mg total) by mouth daily. 90 tablet 1  . rosuvastatin (CRESTOR) 40 MG tablet Take 1 tablet (40 mg total) by mouth daily. 90 tablet 3  . Saxagliptin-Metformin (KOMBIGLYZE XR) 2.01-999 MG TB24 Take 2 tablets by mouth daily. 180 tablet 3  . traMADol (ULTRAM) 50 MG tablet Take 1-2 tablets (50-100 mg total) by mouth every 8 (eight) hours as needed. **Needs office visit for any additional refills.** 30 tablet 0  . cycloSPORINE (RESTASIS) 0.05 % ophthalmic emulsion Place 1 drop into both eyes 2 (two) times daily. 0.4 mL 2   No facility-administered medications prior to visit.      Allergies:  Patient has no known allergies.   Social History   Socioeconomic History  . Marital status: Single    Spouse name: Not on file  . Number of children: Not on file  . Years of education: Not on file  . Highest education level: Not on file  Occupational History  . Not on file  Social Needs  . Financial resource strain: Not on file  . Food insecurity:    Worry: Not on file    Inability: Not on file  . Transportation needs:    Medical: Not on file    Non-medical: Not on file  Tobacco Use  . Smoking status: Former Smoker    Last attempt to quit: 09/30/1999    Years since quitting: 18.3  . Smokeless tobacco: Never Used  Substance and Sexual Activity  . Alcohol use:  Yes    Comment: rarely  . Drug use: No  . Sexual activity: Never  Lifestyle  . Physical activity:    Days per week: Not on file    Minutes per session: Not on file  . Stress: Not on file  Relationships  . Social connections:    Talks on phone: Not on file    Gets together: Not on file    Attends religious service: Not on file    Active member of club or organization: Not on file    Attends meetings of clubs or organizations: Not on file    Relationship status: Not on file  Other Topics Concern  . Not on file  Social History Narrative  . Not on file    Additional social history is notable that she is divorced for 6 years.  She has 2 children and 4 grandchildren.  She had smoked for 15 years but quit smoking in 1998.  She completed 12th grade of education.  Family History:  The patient's family history includes Heart disease in her father.  Her mother is 27 years old.  Her father is 26 years old and has heart disease dating back to 55.  One sister age 11.  His Hodgkin's disease.  The other sister age 54 is stable.  She has 2 children.  ROS General: Negative; No fevers, chills, or night sweats;  HEENT: Negative; No changes in vision or hearing, sinus congestion, difficulty swallowing Pulmonary: Negative; No cough, wheezing, shortness of breath, hemoptysis Cardiovascular: see HPI GI: Negative; No nausea, vomiting, diarrhea, or abdominal pain GU: Negative; No dysuria, hematuria, or difficulty voiding Musculoskeletal: Negative; no myalgias, joint pain, or weakness Hematologic/Oncology: Negative; no easy bruising, bleeding Endocrine: Negative; no heat/cold intolerance; no diabetes Neuro: Right leg numbness Skin: Negative; No rashes or skin lesions Psychiatric: Negative; No behavioral problems, depression Sleep: Negative; No snoring, daytime sleepiness, hypersomnolence, bruxism, restless legs, hypnogognic hallucinations, no cataplexy Other comprehensive 14 point system review is  negative.   PHYSICAL EXAM:   VS:  BP 103/61   Pulse 89   Ht 5' 4"  (1.626 m)   Wt 228 lb (103.4 kg)   BMI 39.14 kg/m     Repeat blood pressure by me was 110/60 supine and 106/60 standing  Wt Readings from Last 3 Encounters:  01/28/18 228 lb (103.4 kg)  11/17/17 235 lb (106.6 kg)  02/24/17 227 lb 9.6 oz (103.2 kg)    General: Alert, oriented, no distress.  Skin: normal turgor, no rashes, warm and dry HEENT: Normocephalic, atraumatic. Pupils equal round and reactive to light; sclera anicteric; extraocular muscles intact; status post cataract surgery with bilateral lens  implants.  Status post recent laser treatment Rx.  Fundi no hemorrhages or exudates.  No AV nicking.  Disc flat Nose without nasal septal hypertrophy Mouth/Parynx benign; Mallinpatti scale 3 Neck: No JVD, no carotid bruits; normal carotid upstroke Lungs: clear to ausculatation and percussion; no wheezing or rales Chest wall: without tenderness to palpitation Heart: PMI not displaced, RRR, s1 s2 normal, 1/6 systolic murmur, no diastolic murmur, no rubs, gallops, thrills, or heaves Abdomen: soft, nontender; no hepatosplenomehaly, BS+; abdominal aorta nontender and not dilated by palpation. Back: no CVA tenderness Pulses 2+ Musculoskeletal: full range of motion, normal strength, no joint deformities Extremities: no clubbing cyanosis or edema, Homan's sign negative  Neurologic: grossly nonfocal; Cranial nerves grossly wnl Psychologic: Normal mood and affect   Studies/Labs Reviewed:   EKG:  EKG is ordered today.  ECG (independently read by me): Normal sinus rhythm at 89 bpm.  Nonspecific ST changes.  Normal intervals.  No ectopy.                                      Recent Labs: BMP Latest Ref Rng & Units 11/17/2017 02/24/2017 06/20/2016  Glucose 65 - 99 mg/dL 224(H) 92 129(H)  BUN 8 - 27 mg/dL 10 18 23   Creatinine 0.57 - 1.00 mg/dL 0.90 0.89 1.04  BUN/Creat Ratio 12 - 28 11(L) 20 -  Sodium 134 - 144 mmol/L 140 140 138   Potassium 3.5 - 5.2 mmol/L 4.3 3.9 4.2  Chloride 96 - 106 mmol/L 100 102 98  CO2 20 - 29 mmol/L 24 23 26   Calcium 8.7 - 10.3 mg/dL 10.1 9.4 9.7     Hepatic Function Latest Ref Rng & Units 11/17/2017 02/24/2017 06/20/2016  Total Protein 6.0 - 8.5 g/dL 7.6 7.1 7.1  Albumin 3.6 - 4.8 g/dL 4.7 4.5 4.4  AST 0 - 40 IU/L 64(H) 57(H) 51(H)  ALT 0 - 32 IU/L 61(H) 73(H) 39(H)  Alk Phosphatase 39 - 117 IU/L 78 54 55  Total Bilirubin 0.0 - 1.2 mg/dL 0.5 0.7 0.7    CBC Latest Ref Rng & Units 01/20/2014  WBC 4.6 - 10.2 K/uL 6.9  Hemoglobin 12.2 - 16.2 g/dL 13.0  Hematocrit 37.7 - 47.9 % 41.2   Lab Results  Component Value Date   MCV 98.8 (A) 01/20/2014   Lab Results  Component Value Date   TSH 33.250 (H) 11/17/2017   Lab Results  Component Value Date   HGBA1C 9.4 11/17/2017     BNP No results found for: BNP  ProBNP No results found for: PROBNP   Lipid Panel     Component Value Date/Time   CHOL 166 08/14/2016 0921   TRIG 136 08/14/2016 0921   HDL 32 (L) 08/14/2016 0921   CHOLHDL 5.2 (H) 08/14/2016 0921   VLDL 27 08/14/2016 0921   LDLCALC 107 (H) 08/14/2016 0921   LDLDIRECT 153 (H) 11/17/2017 1541     RADIOLOGY: No results found.   ASSESSMENT:    1. Dyspnea on exertion   2. Hyperlipidemia LDL goal <70   3. Essential hypertension   4. Type 2 diabetes mellitus with complication, without long-term current use of insulin ([place])   5. Hypothyroidism, unspecified type   6. Medication management   7. Pre-procedure lab exam   8. Lower extremity numbness      PLAN:  Ms. Nida Manfredi is a very pleasant 62 year old female who is a long-standing history of  hypertension, hyperlipidemia, diabetes mellitus, hypothyroidism, depression, as well as obstructive sleep apnea.  She has been on CPAP therapy for at least 20 years.  I received a new machine in 2015.  She recently has noticed some shortness of breath with activity.  She also experiences some leg discomfort, but upon  further questioning, she has experienced predominantly right leg numbness intermittently.  Her blood pressure today is stable on almost losartan 20 mg daily.  She has mixed hyperlipidemia and has been on rosuvastatin 40 mg and fenofibrate 145 mg.  She has not had recent lab.  She has hypothyroidism on levothyroxine 150 g.  She has been on Lexapro for anxiety.  She is diabetic on saxagliptin/metformin 2.5/100 mg 2 tablets daily.  I'm recommending she undergo an echo Doppler study to evaluate both systolic and diastolic function.  With her cardiovascular comorbidities, I have recommended a CT angiogram to assess for subclinical atherosclerosis.  With her leg discomfort.  She will undergo a lower extremity arterial Doppler to make certain this is not of vascular etiology.  However, I suspect this is predominantly more of a neuropathy and I have suggested neurologic evaluation.  She will undergo complete set of fasting laboratory.  I will see her back in the office in several months following the above studies and further recommendations will be made at that time.   Medication Adjustments/Labs and Tests Ordered: Current medicines are reviewed at length with the patient today.  Concerns regarding medicines are outlined above.  Medication changes, Labs and Tests ordered today are listed in the Patient Instructions below. Patient Instructions  Medication Instructions:  Your physician recommends that you continue on your current medications as directed. Please refer to the Current Medication list given to you today.  Labwork:  Please return for FASTING labs 1-2 weeks prior to CT (CMET, CBC, Lipid, TSH)  Our in office lab hours are Monday-Friday 8:00-4:00, closed for lunch 12:45-1:45 pm.  No appointment needed.  Testing/Procedures: Your physician has requested that you have an echocardiogram. Echocardiography is a painless test that uses sound waves to create images of your heart. It provides your doctor with  information about the size and shape of your heart and how well your heart's chambers and valves are working. This procedure takes approximately one hour. There are no restrictions for this procedure.  This will be done at our [facility] location:  [place] has requested that you have cardiac CT. Cardiac computed tomography (CT) is a painless test that uses an x-ray machine to take clear, detailed pictures of your heart. For further information please visit [email or web address]. Please follow instruction sheet as given.  Your physician has requested that you have a lower or upper extremity arterial duplex. This test is an ultrasound of the arteries in the legs or arms. It looks at arterial blood flow in the legs and arms. Allow one hour for Lower and Upper Arterial scans. There are no restrictions or special instructions  Follow-Up: 2 months with Dr. Dow Adolph have been referred to Neurology for neuropathy   Any Other Special Instructions Will Be Listed Below (If Applicable).     If you need a refill on your cardiac medications before your next appointment, please call your pharmacy.      Signed, Shelva Majestic, MD  01/30/2018 10:53 PM    [facility] [address] [place], [place], [place]  [address] Phone: [phone]

## 2018-01-28 NOTE — Patient Instructions (Signed)
Medication Instructions:  Your physician recommends that you continue on your current medications as directed. Please refer to the Current Medication list given to you today.  Labwork:  Please return for FASTING labs 1-2 weeks prior to CT (CMET, CBC, Lipid, TSH)  Our in office lab hours are Monday-Friday 8:00-4:00, closed for lunch 12:45-1:45 pm.  No appointment needed.  Testing/Procedures: Your physician has requested that you have an echocardiogram. Echocardiography is a painless test that uses sound waves to create images of your heart. It provides your doctor with information about the size and shape of your heart and how well your heart's chambers and valves are working. This procedure takes approximately one hour. There are no restrictions for this procedure.  This will be done at our [facility] location:  [place] has requested that you have cardiac CT. Cardiac computed tomography (CT) is a painless test that uses an x-ray machine to take clear, detailed pictures of your heart. For further information please visit [email or web address]. Please follow instruction sheet as given.  Your physician has requested that you have a lower or upper extremity arterial duplex. This test is an ultrasound of the arteries in the legs or arms. It looks at arterial blood flow in the legs and arms. Allow one hour for Lower and Upper Arterial scans. There are no restrictions or special instructions  Follow-Up: 2 months with Dr. Dow Adolph have been referred to Neurology for neuropathy   Any Other Special Instructions Will Be Listed Below (If Applicable).     If you need a refill on your cardiac medications before your next appointment, please call your pharmacy.

## 2018-01-30 ENCOUNTER — Encounter: Payer: Self-pay | Admitting: Cardiovascular Disease

## 2018-02-02 ENCOUNTER — Other Ambulatory Visit: Payer: Self-pay | Admitting: Cardiovascular Disease

## 2018-02-02 DIAGNOSIS — R200 Anesthesia of skin: Secondary | ICD-10-CM

## 2018-02-03 ENCOUNTER — Encounter: Payer: Self-pay | Admitting: Neurology

## 2018-02-11 ENCOUNTER — Other Ambulatory Visit: Payer: Self-pay

## 2018-02-11 ENCOUNTER — Ambulatory Visit (HOSPITAL_BASED_OUTPATIENT_CLINIC_OR_DEPARTMENT_OTHER): Payer: 59

## 2018-02-11 ENCOUNTER — Ambulatory Visit (HOSPITAL_COMMUNITY)
Admission: RE | Admit: 2018-02-11 | Discharge: 2018-02-11 | Disposition: A | Discharge status: Home / Self Care | Payer: 59 | Source: Ambulatory Visit | Attending: Cardiovascular Disease | Admitting: Cardiovascular Disease

## 2018-02-11 DIAGNOSIS — E119 Type 2 diabetes mellitus without complications: Secondary | ICD-10-CM | POA: Insufficient documentation

## 2018-02-11 DIAGNOSIS — E785 Hyperlipidemia, unspecified: Secondary | ICD-10-CM | POA: Insufficient documentation

## 2018-02-11 DIAGNOSIS — Z6839 Body mass index (BMI) 39.0-39.9, adult: Secondary | ICD-10-CM | POA: Diagnosis not present

## 2018-02-11 DIAGNOSIS — R0609 Other forms of dyspnea: Secondary | ICD-10-CM | POA: Diagnosis not present

## 2018-02-11 DIAGNOSIS — E669 Obesity, unspecified: Secondary | ICD-10-CM | POA: Diagnosis not present

## 2018-02-11 DIAGNOSIS — I119 Hypertensive heart disease without heart failure: Secondary | ICD-10-CM | POA: Insufficient documentation

## 2018-02-11 DIAGNOSIS — R200 Anesthesia of skin: Secondary | ICD-10-CM | POA: Insufficient documentation

## 2018-02-11 DIAGNOSIS — R0600 Dyspnea, unspecified: Secondary | ICD-10-CM | POA: Diagnosis present

## 2018-02-11 DIAGNOSIS — I10 Essential (primary) hypertension: Secondary | ICD-10-CM | POA: Diagnosis not present

## 2018-02-17 ENCOUNTER — Encounter: Payer: Self-pay | Admitting: *Deleted

## 2018-03-06 ENCOUNTER — Telehealth: Payer: Self-pay | Admitting: Neurology

## 2018-03-09 ENCOUNTER — Encounter: Payer: Self-pay | Admitting: Neurology

## 2018-03-09 ENCOUNTER — Ambulatory Visit: Payer: 59 | Admitting: Neurology

## 2018-03-09 VITALS — BP 120/60 | HR 64 | Ht 64.0 in | Wt 232.0 lb

## 2018-03-09 DIAGNOSIS — M5417 Radiculopathy, lumbosacral region: Secondary | ICD-10-CM | POA: Diagnosis not present

## 2018-03-09 NOTE — Patient Instructions (Signed)
Referral to physical therapy for low back strengthening  Return to clinic as needed

## 2018-03-09 NOTE — Progress Notes (Signed)
[facility] Clinic Note - Initial Visit   Date: 03/09/18  Sue Baker MRN: [id number] DOB: 1955/12/07   Dear Dr. Claiborne Billings:  Thank you for your kind referral of Sue Baker for consultation of right leg numbness. Although her history is well known to you, please allow [place] to reiterate it for the purpose of our medical record. The patient was accompanied to the clinic by self.   History of Present Illness: Sue Baker is a 62 y.o. right-handed Caucasian female with hypertension, hyperlipidemia, diabetes mellitus, hypothyroidism, and OSA presenting for evaluation of right leg numbness.    Starting around February 2019, she developed tingling and numbness and tingling over the right lateral lower leg, dorsum of the foot, and toes.  Symptoms were initially triggered by standing and walking, and alleviated with rest.  She did not have low back pain or similar problem on the left leg. No preceding injury.  No associated weakness, falls, or imbalance.  No problems waking or climbing stairs. Since May 2019, her numbness has markedly improved by ~ 75% and she almost cancelled her visit today.  She stays very active doing carpentry work, Architect work, Social research officer, government., in addition to her fulltime job at [employer] and [employer].  Recently, she built her grandson a 8 X 8 outdoor playhouse complete with parking garage for toy vehicles.  Out-side paper records, electronic medical record, and images have been reviewed where available and summarized as:  Lab Results  Component Value Date   TSH 33.250 (H) 11/17/2017   Lab Results  Component Value Date   [id number] 244 11/17/2017   Lab Results  Component Value Date   HGBA1C 9.4 11/17/2017     Past Medical History:  Diagnosis Date  . Allergy   . Arthritis   . Cataract   . Diabetes mellitus without complication ([place])   . GERD (gastroesophageal reflux disease)   . Hypertension   . Thyroid disease     Past Surgical History:    Procedure Laterality Date  . BREAST SURGERY    . CARPAL TUNNEL RELEASE    . EYE SURGERY    . KNEE SURGERY     left  . TUBAL LIGATION       Medications:  Outpatient Encounter Medications as of 03/09/2018  Medication Sig  . aspirin 81 MG tablet Take 81 mg by mouth daily.  . cyclobenzaprine (FLEXERIL) 10 MG tablet Take 0.5-1 tablets (5-10 mg total) by mouth 3 (three) times daily as needed for muscle spasms. **Needs office visit for any additional refills**  . escitalopram (LEXAPRO) 10 MG tablet Take 1 tablet (10 mg total) by mouth daily. TAKE 1 TABLET (10 MG TOTAL) BY MOUTH DAILY.  . fenofibrate (TRICOR) 145 MG tablet Take 1 tablet (145 mg total) by mouth daily.  Marland Kitchen levothyroxine (SYNTHROID, LEVOTHROID) 150 MCG tablet Take 1 tablet (150 mcg total) by mouth daily before breakfast.  . meloxicam (MOBIC) 15 MG tablet Take 1 tablet (15 mg total) by mouth daily as needed for pain.  . metoprolol tartrate (LOPRESSOR) 50 MG tablet Take 50 mg (1 tablet) ONE hour prior to CT  . olmesartan (BENICAR) 20 MG tablet Take 1 tablet (20 mg total) by mouth daily.  . rosuvastatin (CRESTOR) 40 MG tablet Take 1 tablet (40 mg total) by mouth daily.  . Saxagliptin-Metformin (KOMBIGLYZE XR) 2.01-999 MG TB24 Take 2 tablets by mouth daily.  . traMADol (ULTRAM) 50 MG tablet Take 1-2 tablets (50-100 mg total) by mouth every 8 (eight) hours as  needed. **Needs office visit for any additional refills.**   No facility-administered encounter medications on file as of 03/09/2018.      Allergies: No Known Allergies  Family History: Family History  Problem Relation Age of Onset  . Healthy Mother   . Heart disease Father   . Peripheral Artery Disease Father   . Thyroid disease Sister     Social History: Social History   Tobacco Use  . Smoking status: Former Smoker    Last attempt to quit: 09/30/1999    Years since quitting: 18.4  . Smokeless tobacco: Never Used  Substance Use Topics  . Alcohol use: Yes     Comment: rarely  . Drug use: No   Social History   Social History Narrative   Lives in a one story home.  Her daughter and grandson live with her.  Works at [employer]. 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  Education: some college.     Review of Systems:  CONSTITUTIONAL: No fevers, chills, night sweats, or weight loss.   EYES: No visual changes or eye pain ENT: No hearing changes.  No history of nose bleeds.   RESPIRATORY: No cough, wheezing and shortness of breath.   CARDIOVASCULAR: Negative for chest pain, and palpitations.   GI: Negative for abdominal discomfort, blood in stools or black stools.  No recent change in bowel habits.   GU:  No history of incontinence.   MUSCLOSKELETAL: No history of joint pain or swelling.  No myalgias.   SKIN: Negative for lesions, rash, and itching.   HEMATOLOGY/ONCOLOGY: Negative for prolonged bleeding, bruising easily, and swollen nodes.   ENDOCRINE: Negative for cold or heat intolerance, polydipsia or goiter.   PSYCH:  No depression or anxiety symptoms.   NEURO: As Above.   Vital Signs:  BP 120/60   Pulse 64   Ht 5\' 4"  (1.626 m)   Wt 232 lb (105.2 kg)   SpO2 95%   BMI 39.82 kg/m    General Medical Exam:   General:  Well appearing, comfortable.   Eyes/ENT: see cranial nerve examination.   Neck: No masses appreciated.  Full range of motion without tenderness.  No carotid bruits. Respiratory:  Clear to auscultation, good air entry bilaterally.   Cardiac:  Regular rate and rhythm, no murmur.   Extremities:  No deformities, edema, or skin discoloration.  Skin:  No rashes or lesions.  Neurological Exam: MENTAL STATUS including orientation to time, place, person, recent and remote memory, attention span and concentration, language, and fund of knowledge is normal.  Speech is not dysarthric.  CRANIAL NERVES: II:  No visual field defects.  Unremarkable fundi.   III-IV-VI: Pupils equal round and reactive to light.  Normal  conjugate, extra-ocular eye movements in all directions of gaze.  No nystagmus.  No ptosis.   V:  Normal facial sensation.   VII:  Normal facial symmetry and movements.    VIII:  Normal hearing and vestibular function.   IX-X:  Normal palatal movement.   XI:  Normal shoulder shrug and head rotation.   XII:  Normal tongue strength and range of motion, no deviation or fasciculation.  MOTOR:  No atrophy, fasciculations or abnormal movements.  No pronator drift.  Tone is normal.    Right Upper Extremity:    Left Upper Extremity:    Deltoid  5/5   Deltoid  5/5   Biceps  5/5   Biceps  5/5   Triceps  5/5   Triceps  5/5   Wrist extensors  5/5   Wrist extensors  5/5   Wrist flexors  5/5   Wrist flexors  5/5   Finger extensors  5/5   Finger extensors  5/5   Finger flexors  5/5   Finger flexors  5/5   Dorsal interossei  5/5   Dorsal interossei  5/5   Abductor pollicis  5/5   Abductor pollicis  5/5   Tone (Ashworth scale)  0  Tone (Ashworth scale)  0   Right Lower Extremity:    Left Lower Extremity:    Hip flexors  5/5   Hip flexors  5/5   Hip extensors  5/5   Hip extensors  5/5   Knee flexors  5/5   Knee flexors  5/5   Knee extensors  5/5   Knee extensors  5/5   Dorsiflexors  5/5   Dorsiflexors  5/5   Plantarflexors  5/5   Plantarflexors  5/5   Toe extensors  5/5   Toe extensors  5/5   Toe flexors  5/5   Toe flexors  5/5   Tone (Ashworth scale)  0  Tone (Ashworth scale)  0   MSRs:  Right                                                                 Left brachioradialis 2+  brachioradialis 2+  biceps 2+  biceps 2+  triceps 2+  triceps 2+  patellar 2+  patellar 2+  ankle jerk 2+  ankle jerk 2+  Hoffman no  Hoffman no  plantar response down  plantar response down   SENSORY:  Normal and symmetric perception of light touch, pinprick, vibration, and proprioception.  Romberg's sign absent.   COORDINATION/GAIT: Normal finger-to- nose-finger and heel-to-shin.  Intact rapid alternating  movements bilaterally.  Able to rise from a chair without using arms.  Gait narrow based and stable. Tandem and stressed gait intact.    IMPRESSION: Lumbosacral radiculopathy at L5, primary affecting sensory fibers.  Her symptoms have improved by > 75% since onset.  She is at risk to aggravate her low back given the nature of her hobbies which requires heavy lifting, therefore, I will send her to physical therapy to work specifically on low back strengthening and stretching.    Patient was reassured that there is no signs of neuropathy on her exam.  Tight glycemic control was encouraged to minimize risk of developing this.  Return to clinic as needed  Thank you for allowing me to participate in patient's care.  If I can answer any additional questions, I would be pleased to do so.    Sincerely,    Yarelis Ambrosino K. Posey Pronto, DO

## 2018-03-16 NOTE — Telephone Encounter (Signed)
error 

## 2018-03-20 DIAGNOSIS — M545 Low back pain: Secondary | ICD-10-CM | POA: Diagnosis not present

## 2018-03-20 DIAGNOSIS — M25551 Pain in right hip: Secondary | ICD-10-CM | POA: Diagnosis not present

## 2018-03-23 ENCOUNTER — Encounter: Payer: Self-pay | Admitting: Cardiovascular Disease

## 2018-03-23 ENCOUNTER — Ambulatory Visit: Payer: 59 | Admitting: Cardiovascular Disease

## 2018-03-23 VITALS — BP 112/54 | HR 62 | Ht 64.0 in | Wt 229.4 lb

## 2018-03-23 DIAGNOSIS — I10 Essential (primary) hypertension: Secondary | ICD-10-CM

## 2018-03-23 DIAGNOSIS — E118 Type 2 diabetes mellitus with unspecified complications: Secondary | ICD-10-CM

## 2018-03-23 DIAGNOSIS — R200 Anesthesia of skin: Secondary | ICD-10-CM

## 2018-03-23 DIAGNOSIS — E785 Hyperlipidemia, unspecified: Secondary | ICD-10-CM | POA: Diagnosis not present

## 2018-03-23 DIAGNOSIS — E039 Hypothyroidism, unspecified: Secondary | ICD-10-CM | POA: Diagnosis not present

## 2018-03-23 NOTE — Progress Notes (Signed)
Cardiology Office Note    Date:  03/23/2018   ID:  Sue Baker, DOB Feb 25, 1956, MRN [id number]  PCP:  Shawnee Knapp, MD  Cardiologist:  Shelva Majestic, MD   No chief complaint on file. Cardiology evaluation, referred through the courtesy of Dr. Delman Cheadle  History of Present Illness:  Sue Baker is a 62 y.o. female who presents to the office today for a 2 month cardiology evaluation.   Sue Baker is originally from [place] and moved here with [name] when she was transferred to the [place] area.  She has a long-standing history of hypertension, diabetes mellitus for at least 6 years, hyperlipidemia, and has a diagnosis of obstructive sleep apnea for almost 20 years.  She received a new CPAP machine in 2015.  She continues to work.  She has had difficulty with right leg numbness and also has noticed some shortness of breath with activity.  She denies any definitive lower extremity pain with walking but admits to numbness.   She has difficulty with knee discomfort.  She has noticed episodes of some stress mediated chest pressure but denies any clear-cut exertional precipitation.  Her last stress test was in [place], [place] at least 8 years ago.    She established care with me in May 2019.  He underwent an echo Doppler study on Feb 11, 2018 which essentially was normal.  EF was 60 to 65%.  She had normal diastolic parameters.  There was no significant valvular abnormalities.  She has had issues with back pain and has seen a neurologist and is felt to have symptoms related to L5.  She denies any chest pain.  She is on CPAP for obstructive sleep apnea and uses [name].  She is scheduled to get blood work by Dr. Delman Cheadle in the near future.  She has had some issues with varicose veins.  She underwent lower extremity arterial Doppler and had normal bilateral ankle-brachial indices.  She presents for reevaluation.  Past Medical History:  Diagnosis Date  . Allergy   . Arthritis   .  Cataract   . Diabetes mellitus without complication ([place])   . GERD (gastroesophageal reflux disease)   . Hypertension   . Thyroid disease     Past Surgical History:  Procedure Laterality Date  . BREAST SURGERY    . CARPAL TUNNEL RELEASE    . EYE SURGERY    . KNEE SURGERY     left  . TUBAL LIGATION      Current Medications: Outpatient Medications Prior to Visit  Medication Sig Dispense Refill  . aspirin 81 MG tablet Take 81 mg by mouth daily.    . cyclobenzaprine (FLEXERIL) 10 MG tablet Take 0.5-1 tablets (5-10 mg total) by mouth 3 (three) times daily as needed for muscle spasms. **Needs office visit for any additional refills** 30 tablet 0  . escitalopram (LEXAPRO) 10 MG tablet Take 1 tablet (10 mg total) by mouth daily. TAKE 1 TABLET (10 MG TOTAL) BY MOUTH DAILY. 90 tablet 3  . fenofibrate (TRICOR) 145 MG tablet Take 1 tablet (145 mg total) by mouth daily. 90 tablet 1  . levothyroxine (SYNTHROID, LEVOTHROID) 150 MCG tablet Take 1 tablet (150 mcg total) by mouth daily before breakfast. 90 tablet 0  . meloxicam (MOBIC) 15 MG tablet Take 1 tablet (15 mg total) by mouth daily as needed for pain. 90 tablet 1  . metoprolol tartrate (LOPRESSOR) 50 MG tablet Take 50 mg (1 tablet) ONE hour prior  to CT 1 tablet 0  . olmesartan (BENICAR) 20 MG tablet Take 1 tablet (20 mg total) by mouth daily. 90 tablet 1  . rosuvastatin (CRESTOR) 40 MG tablet Take 1 tablet (40 mg total) by mouth daily. 90 tablet 3  . Saxagliptin-Metformin (KOMBIGLYZE XR) 2.01-999 MG TB24 Take 2 tablets by mouth daily. 180 tablet 3  . traMADol (ULTRAM) 50 MG tablet Take 1-2 tablets (50-100 mg total) by mouth every 8 (eight) hours as needed. **Needs office visit for any additional refills.** 30 tablet 0   No facility-administered medications prior to visit.      Allergies:   Patient has no known allergies.   Social History   Socioeconomic History  . Marital status: Single    Spouse name: Not on file  . Number of  children: Not on file  . Years of education: 64  . Highest education level: Some college, no degree  Occupational History    Employer: [employer]  Social Needs  . Financial resource strain: Not on file  . Food insecurity:    Worry: Not on file    Inability: Not on file  . Transportation needs:    Medical: Not on file    Non-medical: Not on file  Tobacco Use  . Smoking status: Former Smoker    Last attempt to quit: 09/30/1999    Years since quitting: 18.4  . Smokeless tobacco: Never Used  Substance and Sexual Activity  . Alcohol use: Yes    Comment: rarely  . Drug use: No  . Sexual activity: Not Currently  Lifestyle  . Physical activity:    Days per week: Not on file    Minutes per session: Not on file  . Stress: Not on file  Relationships  . Social connections:    Talks on phone: Not on file    Gets together: Not on file    Attends religious service: Not on file    Active member of club or organization: Not on file    Attends meetings of clubs or organizations: Not on file    Relationship status: Not on file  Other Topics Concern  . Not on file  Social History Narrative   Lives in a one story home.  Her daughter and grandson live with her.  Works at [employer]. 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  Education: some college.     Additional social history is notable that she is divorced for 6 years.  She has 2 children and 4 grandchildren.  She had smoked for 15 years but quit smoking in 1998.  She completed 12th grade of education.  Family History:  The patient's family history includes Healthy in her mother; Heart disease in her father; Peripheral Artery Disease in her father; Thyroid disease in her sister.  Her mother is 16 years old.  Her father is 85 years old and has heart disease dating back to 101.  One sister age 31.  His Hodgkin's disease.  The other sister age 19 is stable.  She has 2 children.  ROS General: Negative; No fevers, chills, or  night sweats;  HEENT: Negative; No changes in vision or hearing, sinus congestion, difficulty swallowing Pulmonary: Negative; No cough, wheezing, shortness of breath, hemoptysis Cardiovascular: see HPI GI: Negative; No nausea, vomiting, diarrhea, or abdominal pain GU: Negative; No dysuria, hematuria, or difficulty voiding Musculoskeletal: No back discomfort Hematologic/Oncology: Negative; no easy bruising, bleeding Endocrine: Negative; no heat/cold intolerance; no diabetes Neuro: Right leg numbness  Skin: Negative; No rashes or skin lesions Psychiatric: Negative; No behavioral problems, depression Sleep: Negative; No snoring, daytime sleepiness, hypersomnolence, bruxism, restless legs, hypnogognic hallucinations, no cataplexy Other comprehensive 14 point system review is negative.   PHYSICAL EXAM:   VS:  BP (!) 112/54   Pulse 62   Ht _0  (1.626 m)   Wt 229 lb 6.4 oz (104.1 kg)   BMI 39.38 kg/m     Repeat blood pressure by me 112/68  Wt Readings from Last 3 Encounters:  03/23/18 229 lb 6.4 oz (104.1 kg)  03/09/18 232 lb (105.2 kg)  01/28/18 228 lb (103.4 kg)    General: Alert, oriented, no distress.  Skin: normal turgor, no rashes, warm and dry HEENT: Normocephalic, atraumatic. Pupils equal round and reactive to light; sclera anicteric; extraocular muscles intact; Fundi  Nose without nasal septal hypertrophy Mouth/Parynx benign; Mallinpatti scale 3 Neck: Thick neck; No JVD, no carotid bruits; normal carotid upstroke Lungs: clear to ausculatation and percussion; no wheezing or rales Chest wall: without tenderness to palpitation Heart: PMI not displaced, RRR, s1 s2 normal, 1/6 systolic murmur, no diastolic murmur, no rubs, gallops, thrills, or heaves Abdomen: soft, nontender; no hepatosplenomehaly, BS+; abdominal aorta nontender and not dilated by palpation. Back: no CVA tenderness Pulses 2+ Musculoskeletal: full range of motion, normal strength, no joint  deformities Extremities: Bilateral lower extremity varicose veins ; no clubbing cyanosis or edema, Homan's sign negative  Neurologic: grossly nonfocal; Cranial nerves grossly wnl Psychologic: Normal mood and affect   Studies/Labs Reviewed:   EKG:  EKG is ordered today.  ECG (independently read by me): Normal sinus rhythm at 62 bpm.  Jan 28, 2018 ECG (independently read by me): Normal sinus rhythm at 89 bpm.  Nonspecific ST changes.  Normal intervals.  No ectopy.                                Recent Labs: BMP Latest Ref Rng & Units 11/17/2017 02/24/2017 06/20/2016  Glucose 65 - 99 mg/dL 224(H) 92 129(H)  BUN 8 - 27 mg/dL _1 Creatinine 0.57 - 1.00 mg/dL 0.90 0.89 1.04  BUN/Creat Ratio 12 - 28 11(L) 20 -  Sodium 134 - 144 mmol/L 140 140 138  Potassium 3.5 - 5.2 mmol/L 4.3 3.9 4.2  Chloride 96 - 106 mmol/L 100 102 98  CO2 20 - 29 mmol/L _2 Calcium 8.7 - 10.3 mg/dL 10.1 9.4 9.7     Hepatic Function Latest Ref Rng & Units 11/17/2017 02/24/2017 06/20/2016  Total Protein 6.0 - 8.5 g/dL 7.6 7.1 7.1  Albumin 3.6 - 4.8 g/dL 4.7 4.5 4.4  AST 0 - 40 IU/L 64(H) 57(H) 51(H)  ALT 0 - 32 IU/L 61(H) 73(H) 39(H)  Alk Phosphatase 39 - 117 IU/L 78 54 55  Total Bilirubin 0.0 - 1.2 mg/dL 0.5 0.7 0.7    CBC Latest Ref Rng & Units 01/20/2014  WBC 4.6 - 10.2 K/uL 6.9  Hemoglobin 12.2 - 16.2 g/dL 13.0  Hematocrit 37.7 - 47.9 % 41.2   Lab Results  Component Value Date   MCV 98.8 (A) 01/20/2014   Lab Results  Component Value Date   TSH 33.250 (H) 11/17/2017   Lab Results  Component Value Date   HGBA1C 9.4 11/17/2017     BNP No results found for: BNP  ProBNP No results found for: PROBNP   Lipid Panel     Component Value  Date/Time   CHOL 166 08/14/2016 0921   TRIG 136 08/14/2016 0921   HDL 32 (L) 08/14/2016 0921   CHOLHDL 5.2 (H) 08/14/2016 0921   VLDL 27 08/14/2016 0921   LDLCALC 107 (H) 08/14/2016 0921   LDLDIRECT 153 (H) 11/17/2017 1541     RADIOLOGY: No results  found.   ASSESSMENT:    No diagnosis found.   PLAN:  Sue Baker is a very pleasant 62 year old female who is a long-standing history of hypertension, hyperlipidemia, diabetes mellitus, hypothyroidism, depression, as well as obstructive sleep apnea.  She has been on CPAP therapy for at least 20 years and received a new machine in 2015.  She continues to use CPAP with 100% compliance.  I initially saw her she had noticed some shortness of breath with activity and also had experienced some intermittent right leg numbness.  Lower extremity arterial Doppler studies have shown normal ankle-brachial indices.  She ultimately underwent a neurologic evaluation and this numbness was felt to be secondary to L5 disc disease.  I reviewed her echo Doppler study which was done to evaluate her shortness of breath.  She had normal systolic and diastolic parameters.  She is now on rosuvastatin 40 mg and fenofibrate for mixed  hyperlipidemia.  Blood pressure today is stable on olmesartan 20 mg.  I had initially seen her we had discussed doing a coronary CT Angio this had not been done.  Clinically she is not having any chest pain and feels well.  She is on levothyroxine for hypothyroidism.  Body mass index is approaching morbidly obese at 39.4.  He is diabetic on saxagliptin/metformin.  She will return to the care of her primary physician.  I will see her in 1 year for reevaluation or sooner if problems arise.  Medication Adjustments/Labs and Tests Ordered: Current medicines are reviewed at length with the patient today.  Concerns regarding medicines are outlined above.  Medication changes, Labs and Tests ordered today are listed in the Patient Instructions below. There are no Patient Instructions on file for this visit.   Signed, Shelva Majestic, MD  03/23/2018 4:13 PM    [facility] [address], [place], [place],   [address] Phone: [phone]

## 2018-03-23 NOTE — Patient Instructions (Signed)

## 2018-03-24 ENCOUNTER — Encounter: Payer: Self-pay | Admitting: Cardiovascular Disease

## 2018-03-25 DIAGNOSIS — M25551 Pain in right hip: Secondary | ICD-10-CM | POA: Diagnosis not present

## 2018-03-25 DIAGNOSIS — M545 Low back pain: Secondary | ICD-10-CM | POA: Diagnosis not present

## 2018-04-01 DIAGNOSIS — M25551 Pain in right hip: Secondary | ICD-10-CM | POA: Diagnosis not present

## 2018-04-01 DIAGNOSIS — M545 Low back pain: Secondary | ICD-10-CM | POA: Diagnosis not present

## 2018-05-19 ENCOUNTER — Other Ambulatory Visit: Payer: Self-pay | Admitting: Family Medicine

## 2018-05-19 NOTE — Telephone Encounter (Signed)
Fenofibrate 145 mg  refill Last Refill:11/17/17 # 90 and 1 refill Last OV: 11/17/17 PCP: [place]: [id number]  NOV  06/09/16 Last lipids 11/17/17

## 2018-05-29 ENCOUNTER — Telehealth: Payer: Self-pay | Admitting: Family Medicine

## 2018-06-01 DIAGNOSIS — M76822 Posterior tibial tendinitis, left leg: Secondary | ICD-10-CM | POA: Diagnosis not present

## 2018-06-01 DIAGNOSIS — M19072 Primary osteoarthritis, left ankle and foot: Secondary | ICD-10-CM | POA: Diagnosis not present

## 2018-06-01 DIAGNOSIS — M659 Synovitis and tenosynovitis, unspecified: Secondary | ICD-10-CM | POA: Diagnosis not present

## 2018-06-02 ENCOUNTER — Other Ambulatory Visit: Payer: Self-pay | Admitting: Family Medicine

## 2018-06-02 NOTE — Telephone Encounter (Signed)
lexapro 10 mg refill Last Refill:02/24/17 # 90  With 3 refills    Prescription expired on 02/24/18 Last OV: 11/17/17 PCP: [place]:  [phone]   Levothyroxine 150 mcg  refill Last Refill:02/24/18 # 90  With 3 refills Last OV: 11/17/17 PCP: [place]: [facility] [phone]  TSH done on 11/17/17 was 33.250 Did not return for office visit 01/2018. No upcoming appointment scheduled.

## 2018-06-04 DIAGNOSIS — M71572 Other bursitis, not elsewhere classified, left ankle and foot: Secondary | ICD-10-CM | POA: Diagnosis not present

## 2018-06-04 DIAGNOSIS — M76822 Posterior tibial tendinitis, left leg: Secondary | ICD-10-CM | POA: Diagnosis not present

## 2018-06-09 ENCOUNTER — Ambulatory Visit: Payer: 59 | Admitting: Family Medicine

## 2018-06-12 DIAGNOSIS — M76822 Posterior tibial tendinitis, left leg: Secondary | ICD-10-CM | POA: Diagnosis not present

## 2018-06-12 DIAGNOSIS — M19072 Primary osteoarthritis, left ankle and foot: Secondary | ICD-10-CM | POA: Diagnosis not present

## 2018-06-12 DIAGNOSIS — M71572 Other bursitis, not elsewhere classified, left ankle and foot: Secondary | ICD-10-CM | POA: Diagnosis not present

## 2018-07-08 ENCOUNTER — Encounter: Payer: Self-pay | Admitting: Family Medicine

## 2018-07-08 ENCOUNTER — Ambulatory Visit: Payer: 59 | Admitting: Family Medicine

## 2018-07-08 ENCOUNTER — Other Ambulatory Visit: Payer: Self-pay

## 2018-07-08 VITALS — BP 120/74 | HR 62 | Temp 97.9°F | Resp 16 | Ht 64.0 in | Wt 234.0 lb

## 2018-07-08 DIAGNOSIS — M5431 Sciatica, right side: Secondary | ICD-10-CM

## 2018-07-08 DIAGNOSIS — E785 Hyperlipidemia, unspecified: Secondary | ICD-10-CM | POA: Diagnosis not present

## 2018-07-08 DIAGNOSIS — E039 Hypothyroidism, unspecified: Secondary | ICD-10-CM

## 2018-07-08 DIAGNOSIS — E118 Type 2 diabetes mellitus with unspecified complications: Secondary | ICD-10-CM | POA: Diagnosis not present

## 2018-07-08 DIAGNOSIS — I10 Essential (primary) hypertension: Secondary | ICD-10-CM

## 2018-07-08 DIAGNOSIS — F3342 Major depressive disorder, recurrent, in full remission: Secondary | ICD-10-CM

## 2018-07-08 LAB — POCT GLYCOSYLATED HEMOGLOBIN (HGB A1C): Hemoglobin A1C: 11 % — AB (ref 4.0–5.6)

## 2018-07-08 MED ORDER — LEVOTHYROXINE SODIUM 150 MCG PO TABS: 150.0000 ug | ORAL_TABLET | Freq: Every day | ORAL | 0 refills | Status: DC

## 2018-07-08 MED ORDER — MELOXICAM 15 MG PO TABS: 15.0000 mg | ORAL_TABLET | Freq: Every day | ORAL | 0 refills | Status: DC | PRN

## 2018-07-08 MED ORDER — SAXAGLIPTIN-METFORMIN ER 2.5-1000 MG PO TB24: 2.0000 | ORAL_TABLET | Freq: Every day | ORAL | 0 refills | Status: DC

## 2018-07-08 MED ORDER — ROSUVASTATIN CALCIUM 40 MG PO TABS: 40.0000 mg | ORAL_TABLET | Freq: Every day | ORAL | 0 refills | Status: DC

## 2018-07-08 MED ORDER — OLMESARTAN MEDOXOMIL 20 MG PO TABS: 20.0000 mg | ORAL_TABLET | Freq: Every day | ORAL | 0 refills | Status: DC

## 2018-07-08 MED ORDER — ESCITALOPRAM OXALATE 10 MG PO TABS: 10.0000 mg | ORAL_TABLET | Freq: Every day | ORAL | 0 refills | Status: DC

## 2018-07-08 MED ORDER — FENOFIBRATE 145 MG PO TABS: 145.0000 mg | ORAL_TABLET | Freq: Every day | ORAL | 0 refills | Status: DC

## 2018-07-08 NOTE — Progress Notes (Signed)
Chief Complaint  Patient presents with  . Medication Refill    all medications except the lopressor    HPI   Hypertension: Patient here for follow-up of elevated blood pressure. She is not exercising and is not adherent to low salt diet.  Blood pressure is well controlled at home. Cardiac symptoms none. Patient denies chest pain, chest pressure/discomfort, claudication, dyspnea, exertional chest pressure/discomfort and fatigue.  Cardiovascular risk factors: hypertension and obesity (BMI >= 30 kg/m2). Use of agents associated with hypertension: none. History of target organ damage: none. BP Readings from Last 3 Encounters:  07/08/18 120/74  03/23/18 (!) 112/54  03/09/18 120/60    Thyroid: Patient presents for evaluation of hypothyroidism.  She has been out of her thyroid dose for 3 weeks She reports that she would take her thyroid medication with her other meds because if she splits up the thyroid medication she would forget the others. She stats that she feels very tired.  She reports that she sleeps but still works a lot of hours.  She reports that her BM are normal.  Lab Results  Component Value Date   TSH 33.250 (H) 11/17/2017   Wt Readings from Last 3 Encounters:  07/08/18 234 lb (106.1 kg)  03/23/18 229 lb 6.4 oz (104.1 kg)  03/09/18 232 lb (105.2 kg)    Depression  She states that she feels depressed and irritable. She is also out of her lexapro for the past 2 months She reports that she cries all the time. She states that she also feels less patient with people. No suicidal thoughts. No homicidal thoughts. She reports that she wouldn't call it depression but would rather say she just "doesn't give a shit". Depression screen [facility] 2/9 07/08/2018 11/17/2017 02/24/2017 06/20/2016 09/14/2015  Decreased Interest 0 0 0 0 0  Down, Depressed, Hopeless 0 0 0 0 0  PHQ - 2 Score 0 0 0 0 0   Diabetes Mellitus: Patient presents for follow up of diabetes. Symptoms: fatigue. Patient denies  foot ulcerations, hypoglycemia , increase appetite, nausea, paresthesia of the feet and polydipsia.  Evaluation to date has been included: hemoglobin A1C.  Home sugars: patient does not check sugars.  Lab Results  Component Value Date   HGBA1C 9.4 11/17/2017  she misses her diabetes medications sometimes She has not run out of her meds Occasionally she checks her sugars and her results are around 250 Since being out of her lexapro her diabetes has not mattered to her   Past Medical History:  Diagnosis Date  . Allergy   . Arthritis   . Cataract   . Diabetes mellitus without complication ([place])   . GERD (gastroesophageal reflux disease)   . Hypertension   . Thyroid disease     Current Outpatient Medications  Medication Sig Dispense Refill  . aspirin 81 MG tablet Take 81 mg by mouth daily.    . cyclobenzaprine (FLEXERIL) 10 MG tablet Take 0.5-1 tablets (5-10 mg total) by mouth 3 (three) times daily as needed for muscle spasms. **Needs office visit for any additional refills** 30 tablet 0  . escitalopram (LEXAPRO) 10 MG tablet Take 1 tablet (10 mg total) by mouth daily. TAKE 1 TABLET (10 MG TOTAL) BY MOUTH DAILY. 90 tablet 0  . fenofibrate (TRICOR) 145 MG tablet Take 1 tablet (145 mg total) by mouth daily. 90 tablet 0  . levothyroxine (SYNTHROID, LEVOTHROID) 150 MCG tablet Take 1 tablet (150 mcg total) by mouth daily before breakfast. 90 tablet 0  .  meloxicam (MOBIC) 15 MG tablet Take 1 tablet (15 mg total) by mouth daily as needed for pain. 90 tablet 0  . olmesartan (BENICAR) 20 MG tablet Take 1 tablet (20 mg total) by mouth daily. 90 tablet 0  . rosuvastatin (CRESTOR) 40 MG tablet Take 1 tablet (40 mg total) by mouth daily. 90 tablet 0  . Saxagliptin-Metformin (KOMBIGLYZE XR) 2.01-999 MG TB24 Take 2 tablets by mouth daily. 180 tablet 0  . traMADol (ULTRAM) 50 MG tablet Take 1-2 tablets (50-100 mg total) by mouth every 8 (eight) hours as needed. **Needs office visit for any additional  refills.** 30 tablet 0   No current facility-administered medications for this visit.     Allergies: No Known Allergies  Past Surgical History:  Procedure Laterality Date  . BREAST SURGERY    . CARPAL TUNNEL RELEASE    . EYE SURGERY    . KNEE SURGERY     left  . TUBAL LIGATION      Social History   Socioeconomic History  . Marital status: Single    Spouse name: Not on file  . Number of children: Not on file  . Years of education: 37  . Highest education level: Some college, no degree  Occupational History    Employer: [employer]  Social Needs  . Financial resource strain: Not on file  . Food insecurity:    Worry: Not on file    Inability: Not on file  . Transportation needs:    Medical: Not on file    Non-medical: Not on file  Tobacco Use  . Smoking status: Former Smoker    Last attempt to quit: 09/30/1999    Years since quitting: 18.7  . Smokeless tobacco: Never Used  Substance and Sexual Activity  . Alcohol use: Yes    Comment: rarely  . Drug use: No  . Sexual activity: Not Currently  Lifestyle  . Physical activity:    Days per week: Not on file    Minutes per session: Not on file  . Stress: Not on file  Relationships  . Social connections:    Talks on phone: Not on file    Gets together: Not on file    Attends religious service: Not on file    Active member of club or organization: Not on file    Attends meetings of clubs or organizations: Not on file    Relationship status: Not on file  Other Topics Concern  . Not on file  Social History Narrative   Lives in a one story home.  Her daughter and grandson live with her.  Works at [employer]. 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  Education: some college.     Family History  Problem Relation Age of Onset  . Healthy Mother   . Heart disease Father   . Peripheral Artery Disease Father   . Thyroid disease Sister      ROS Review of Systems See HPI Constitution: No fevers or  chills No malaise, +fatigue No diaphoresis Skin: No rash or itching Eyes: no blurry vision, no double vision GU: no dysuria or hematuria Neuro: no dizziness or headaches all others reviewed and negative   Objective: Vitals:   07/08/18 1552  BP: 120/74  Pulse: 62  Resp: 16  Temp: 97.9 F (36.6 C)  TempSrc: Oral  SpO2: 96%  Weight: 234 lb (106.1 kg)  Height: 5\' 4"  (1.626 m)    Physical Exam  Constitutional: She is oriented  to person, place, and time. She appears well-developed and well-nourished.  +obese female  HENT:  Head: Normocephalic and atraumatic.  Eyes: Conjunctivae and EOM are normal.  Neck: Normal range of motion. Neck supple. No thyromegaly present.  Cardiovascular: Normal rate, regular rhythm and normal heart sounds.  No murmur heard. Pulmonary/Chest: Effort normal and breath sounds normal. No stridor. No respiratory distress.  Neurological: She is alert and oriented to person, place, and time.  Skin: Skin is warm. Capillary refill takes less than 2 seconds.  Psychiatric: She has a normal mood and affect. Her behavior is normal. Judgment and thought content normal.    Assessment and Plan Ezell was seen today for medication refill.  Diagnoses and all orders for this visit:  Type 2 diabetes mellitus with complication, without long-term current use of insulin ([place])-  Last levels were elevated Pt is not compliant with diet Will recheck her a1c now and have patient return for the rest of her labs and plan for pt to follow up with Nutrition Her low energy might be related to her uncontrolled diabetes -     Cancel: Hemoglobin A1c -     Lipid panel; Future -     Hemoglobin A1c; Future -     Comprehensive metabolic panel; Future   Essential hypertension- Patient's blood pressure is at goal of 130/80 or less. Condition is stable. Continue current medications and treatment plan. I recommend that you exercise for 30-45 minutes 5 days a week. I also recommend a  balanced diet with fruits and vegetables every day, lean meats, and little fried foods. The DASH diet (you can find this online) is a good example of this.  -     Cancel: Comprehensive metabolic panel -     Comprehensive metabolic panel; Future  Hyperlipidemia LDL goal <70- discussed goals of lipid mgmt in diabetics  -     Cancel: Lipid panel -     Cancel: Comprehensive metabolic panel -     Lipid panel; Future  Morbid obesity ([place]) -     Hemoglobin A1c; Future -     Comprehensive metabolic panel; Future  Recurrent major depressive disorder, in full remission ([place])- will restart Lexapro, no SI, pt stable  Acquired hypothyroidism- discussed how to take her thyroid Return in one month for monitoring Since she is off her meds the results would be normal -     Cancel: TSH -     TSH; Future  Other orders -     olmesartan (BENICAR) 20 MG tablet; Take 1 tablet (20 mg total) by mouth daily. -     Saxagliptin-Metformin (KOMBIGLYZE XR) 2.01-999 MG TB24; Take 2 tablets by mouth daily. -     fenofibrate (TRICOR) 145 MG tablet; Take 1 tablet (145 mg total) by mouth daily. -     rosuvastatin (CRESTOR) 40 MG tablet; Take 1 tablet (40 mg total) by mouth daily. -     meloxicam (MOBIC) 15 MG tablet; Take 1 tablet (15 mg total) by mouth daily as needed for pain. -     escitalopram (LEXAPRO) 10 MG tablet; Take 1 tablet (10 mg total) by mouth daily. TAKE 1 TABLET (10 MG TOTAL) BY MOUTH DAILY. -     levothyroxine (SYNTHROID, LEVOTHROID) 150 MCG tablet; Take 1 tablet (150 mcg total) by mouth daily before breakfast.  A total of 45 minutes were spent face-to-face with the patient during this encounter and over half of that time was spent on counseling and coordination of care.  Spent time counseling about compliance and the goals for diabetes, hypertension and hyperlipidemia to prevent heart disease   Jasiel Apachito A Nolon Rod

## 2018-07-08 NOTE — Patient Instructions (Addendum)
If you have lab work done today you will be contacted with your lab results within the next 2 weeks.  If you have not heard from [place] then please contact us. The fastest way to get your results is to register for My Chart.   IF you received an x-ray today, you will receive an invoice from [facility]. Please contact [facility] at [phone] with questions or concerns regarding your invoice.   IF you received labwork today, you will receive an invoice from [facility]. Please contact [facility] at [phone] with questions or concerns regarding your invoice.   Our billing staff will not be able to assist you with questions regarding bills from these companies.  You will be contacted with the lab results as soon as they are available. The fastest way to get your results is to activate your My Chart account. Instructions are located on the last page of this paperwork. If you have not heard from [place] regarding the results in 2 weeks, please contact this office.     Hypothyroidism Hypothyroidism is a disorder of the thyroid. The thyroid is a large gland that is located in the lower front of the neck. The thyroid releases hormones that control how the body works. With hypothyroidism, the thyroid does not make enough of these hormones. What are the causes? Causes of hypothyroidism may include:  Viral infections.  Pregnancy.  Your own defense system (immune system) attacking your thyroid.  Certain medicines.  Birth defects.  Past radiation treatments to your head or neck.  Past treatment with radioactive iodine.  Past surgical removal of part or all of your thyroid.  Problems with the gland that is located in the center of your brain (pituitary).  What are the signs or symptoms? Signs and symptoms of hypothyroidism may include:  Feeling as though you have no energy (lethargy).  Inability to tolerate cold.  Weight gain that is not explained by a change in diet  or exercise habits.  Dry skin.  Coarse hair.  Menstrual irregularity.  Slowing of thought processes.  Constipation.  Sadness or depression.  How is this diagnosed? Your health care provider may diagnose hypothyroidism with blood tests and ultrasound tests. How is this treated? Hypothyroidism is treated with medicine that replaces the hormones that your body does not make. After you begin treatment, it may take several weeks for symptoms to go away. Follow these instructions at home:  Take medicines only as directed by your health care provider.  If you start taking any new medicines, tell your health care provider.  Keep all follow-up visits as directed by your health care provider. This is important. As your condition improves, your dosage needs may change. You will need to have blood tests regularly so that your health care provider can watch your condition. Contact a health care provider if:  Your symptoms do not get better with treatment.  You are taking thyroid replacement medicine and: ? You sweat excessively. ? You have tremors. ? You feel anxious. ? You lose weight rapidly. ? You cannot tolerate heat. ? You have emotional swings. ? You have diarrhea. ? You feel weak. Get help right away if:  You develop chest pain.  You develop an irregular heartbeat.  You develop a rapid heartbeat. This information is not intended to replace advice given to you by your health care provider. Make sure you discuss any questions you have with your health care provider. Document Released: 09/09/2005 Document Revised: 02/15/2016 Document  Reviewed: 01/25/2014 Elsevier Interactive Patient Education  [employer].

## 2018-07-16 NOTE — Telephone Encounter (Signed)
DONE

## 2018-08-07 ENCOUNTER — Ambulatory Visit (INDEPENDENT_AMBULATORY_CARE_PROVIDER_SITE_OTHER): Payer: 59 | Admitting: Family Medicine

## 2018-08-07 DIAGNOSIS — E039 Hypothyroidism, unspecified: Secondary | ICD-10-CM

## 2018-08-07 DIAGNOSIS — E118 Type 2 diabetes mellitus with unspecified complications: Secondary | ICD-10-CM

## 2018-08-07 DIAGNOSIS — E785 Hyperlipidemia, unspecified: Secondary | ICD-10-CM

## 2018-08-07 DIAGNOSIS — I10 Essential (primary) hypertension: Secondary | ICD-10-CM | POA: Diagnosis not present

## 2018-08-08 LAB — COMPREHENSIVE METABOLIC PANEL
A/G RATIO: 1.7 (ref 1.2–2.2)
ALBUMIN: 4.7 g/dL (ref 3.6–4.8)
ALK PHOS: 74 IU/L (ref 39–117)
ALT: 50 IU/L — ABNORMAL HIGH (ref 0–32)
AST: 64 IU/L — ABNORMAL HIGH (ref 0–40)
BILIRUBIN TOTAL: 0.7 mg/dL (ref 0.0–1.2)
BUN / CREAT RATIO: 18 (ref 12–28)
BUN: 16 mg/dL (ref 8–27)
CHLORIDE: 99 mmol/L (ref 96–106)
CO2: 25 mmol/L (ref 20–29)
Calcium: 9.7 mg/dL (ref 8.7–10.3)
Creatinine, Ser: 0.89 mg/dL (ref 0.57–1.00)
GFR calc Af Amer: 81 mL/min/{1.73_m2} (ref >59)
GFR calc non Af Amer: 71 mL/min/{1.73_m2} (ref >59)
Globulin, Total: 2.7 g/dL (ref 1.5–4.5)
Glucose: 254 mg/dL — ABNORMAL HIGH (ref 65–99)
POTASSIUM: 4.5 mmol/L (ref 3.5–5.2)
Sodium: 140 mmol/L (ref 134–144)
Total Protein: 7.4 g/dL (ref 6.0–8.5)

## 2018-08-08 LAB — TSH: TSH: 11.01 u[IU]/mL — AB (ref 0.450–4.500)

## 2018-08-08 LAB — LIPID PANEL
CHOL/HDL RATIO: 4.4 ratio (ref 0.0–4.4)
CHOLESTEROL TOTAL: 162 mg/dL (ref 100–199)
HDL: 37 mg/dL — ABNORMAL LOW (ref >39)
LDL Calculated: 87 mg/dL (ref 0–99)
TRIGLYCERIDES: 192 mg/dL — AB (ref 0–149)
VLDL CHOLESTEROL CAL: 38 mg/dL (ref 5–40)

## 2018-08-08 MED ORDER — LEVOTHYROXINE SODIUM 175 MCG PO TABS: 175.0000 ug | ORAL_TABLET | Freq: Every day | ORAL | 0 refills | Status: DC

## 2018-09-14 ENCOUNTER — Other Ambulatory Visit: Payer: Self-pay | Admitting: Family Medicine

## 2018-09-19 DIAGNOSIS — E118 Type 2 diabetes mellitus with unspecified complications: Secondary | ICD-10-CM | POA: Diagnosis not present

## 2018-09-19 DIAGNOSIS — N390 Urinary tract infection, site not specified: Secondary | ICD-10-CM | POA: Diagnosis not present

## 2018-10-01 ENCOUNTER — Other Ambulatory Visit: Payer: Self-pay | Admitting: Family Medicine

## 2018-10-02 ENCOUNTER — Other Ambulatory Visit: Payer: Self-pay | Admitting: Family Medicine

## 2018-10-05 DIAGNOSIS — M1712 Unilateral primary osteoarthritis, left knee: Secondary | ICD-10-CM | POA: Diagnosis not present

## 2018-10-05 DIAGNOSIS — M25562 Pain in left knee: Secondary | ICD-10-CM | POA: Diagnosis not present

## 2018-10-20 DIAGNOSIS — M25562 Pain in left knee: Secondary | ICD-10-CM | POA: Diagnosis not present

## 2018-10-20 DIAGNOSIS — M1712 Unilateral primary osteoarthritis, left knee: Secondary | ICD-10-CM | POA: Diagnosis not present

## 2018-10-27 DIAGNOSIS — M25562 Pain in left knee: Secondary | ICD-10-CM | POA: Diagnosis not present

## 2018-11-03 DIAGNOSIS — M1712 Unilateral primary osteoarthritis, left knee: Secondary | ICD-10-CM | POA: Diagnosis not present

## 2018-11-03 DIAGNOSIS — M25562 Pain in left knee: Secondary | ICD-10-CM | POA: Diagnosis not present

## 2018-11-03 DIAGNOSIS — S72401A Unspecified fracture of lower end of right femur, initial encounter for closed fracture: Secondary | ICD-10-CM | POA: Diagnosis not present

## 2018-11-05 ENCOUNTER — Other Ambulatory Visit: Payer: Self-pay

## 2018-11-05 ENCOUNTER — Ambulatory Visit: Payer: 59 | Admitting: Emergency Medicine

## 2018-11-05 ENCOUNTER — Encounter: Payer: Self-pay | Admitting: Emergency Medicine

## 2018-11-05 VITALS — BP 121/73 | HR 75 | Temp 98.4°F | Resp 16 | Wt 224.8 lb

## 2018-11-05 DIAGNOSIS — E039 Hypothyroidism, unspecified: Secondary | ICD-10-CM | POA: Diagnosis not present

## 2018-11-05 DIAGNOSIS — E1165 Type 2 diabetes mellitus with hyperglycemia: Secondary | ICD-10-CM | POA: Diagnosis not present

## 2018-11-05 DIAGNOSIS — E118 Type 2 diabetes mellitus with unspecified complications: Secondary | ICD-10-CM | POA: Diagnosis not present

## 2018-11-05 LAB — POCT GLYCOSYLATED HEMOGLOBIN (HGB A1C): HEMOGLOBIN A1C: 10.6 % — AB (ref 4.0–5.6)

## 2018-11-05 LAB — GLUCOSE, POCT (MANUAL RESULT ENTRY): POC Glucose: 236 mg/dl — AB (ref 70–99)

## 2018-11-05 MED ORDER — LEVOTHYROXINE SODIUM 175 MCG PO TABS: 175.0000 ug | ORAL_TABLET | Freq: Every day | ORAL | 1 refills | Status: DC

## 2018-11-05 MED ORDER — OLMESARTAN MEDOXOMIL 20 MG PO TABS: 20.0000 mg | ORAL_TABLET | Freq: Every day | ORAL | 1 refills | Status: DC

## 2018-11-05 MED ORDER — SAXAGLIPTIN-METFORMIN ER 2.5-1000 MG PO TB24: 2.0000 | ORAL_TABLET | Freq: Every day | ORAL | 1 refills | Status: DC

## 2018-11-05 MED ORDER — MELOXICAM 15 MG PO TABS: 15.0000 mg | ORAL_TABLET | Freq: Every day | ORAL | 0 refills | Status: DC | PRN

## 2018-11-05 MED ORDER — FENOFIBRATE 145 MG PO TABS: 145.0000 mg | ORAL_TABLET | Freq: Every day | ORAL | 1 refills | Status: DC

## 2018-11-05 MED ORDER — ROSUVASTATIN CALCIUM 40 MG PO TABS: 40.0000 mg | ORAL_TABLET | Freq: Every day | ORAL | 1 refills | Status: DC

## 2018-11-05 NOTE — Patient Instructions (Addendum)
   If you have lab work done today you will be contacted with your lab results within the next 2 weeks.  If you have not heard from us then please contact us. The fastest way to get your results is to register for My Chart.   IF you received an x-ray today, you will receive an invoice from [facility]. Please contact [facility] at [phone] with questions or concerns regarding your invoice.   IF you received labwork today, you will receive an invoice from [facility]. Please contact [facility] at [phone] with questions or concerns regarding your invoice.   Our billing staff will not be able to assist you with questions regarding bills from these companies.  You will be contacted with the lab results as soon as they are available. The fastest way to get your results is to activate your My Chart account. Instructions are located on the last page of this paperwork. If you have not heard from us regarding the results in 2 weeks, please contact this office.     Diabetes Mellitus and Nutrition, Adult When you have diabetes (diabetes mellitus), it is very important to have healthy eating habits because your blood sugar (glucose) levels are greatly affected by what you eat and drink. Eating healthy foods in the appropriate amounts, at about the same times every day, can help you:  Control your blood glucose.  Lower your risk of heart disease.  Improve your blood pressure.  Reach or maintain a healthy weight. Every person with diabetes is different, and each person has different needs for a meal plan. Your health care provider may recommend that you work with a diet and nutrition specialist (dietitian) to make a meal plan that is best for you. Your meal plan may vary depending on factors such as:  The calories you need.  The medicines you take.  Your weight.  Your blood glucose, blood pressure, and cholesterol levels.  Your activity level.  Other health conditions  you have, such as heart or kidney disease. How do carbohydrates affect me? Carbohydrates, also called carbs, affect your blood glucose level more than any other type of food. Eating carbs naturally raises the amount of glucose in your blood. Carb counting is a method for keeping track of how many carbs you eat. Counting carbs is important to keep your blood glucose at a healthy level, especially if you use insulin or take certain oral diabetes medicines. It is important to know how many carbs you can safely have in each meal. This is different for every person. Your dietitian can help you calculate how many carbs you should have at each meal and for each snack. Foods that contain carbs include:  Bread, cereal, rice, pasta, and crackers.  Potatoes and corn.  Peas, beans, and lentils.  Milk and yogurt.  Fruit and juice.  Desserts, such as cakes, cookies, ice cream, and candy. How does alcohol affect me? Alcohol can cause a sudden decrease in blood glucose (hypoglycemia), especially if you use insulin or take certain oral diabetes medicines. Hypoglycemia can be a life-threatening condition. Symptoms of hypoglycemia (sleepiness, dizziness, and confusion) are similar to symptoms of having too much alcohol. If your health care provider says that alcohol is safe for you, follow these guidelines:  Limit alcohol intake to no more than 1 drink per day for nonpregnant women and 2 drinks per day for men. One drink equals 12 oz of beer, 5 oz of wine, or 1 oz of hard liquor.    Do not drink on an empty stomach.  Keep yourself hydrated with water, diet soda, or unsweetened iced tea.  Keep in mind that regular soda, juice, and other mixers may contain a lot of sugar and must be counted as carbs. What are tips for following this plan?  Reading food labels  Start by checking the serving size on the "Nutrition Facts" label of packaged foods and drinks. The amount of calories, carbs, fats, and other  nutrients listed on the label is based on one serving of the item. Many items contain more than one serving per package.  Check the total grams (g) of carbs in one serving. You can calculate the number of servings of carbs in one serving by dividing the total carbs by 15. For example, if a food has 30 g of total carbs, it would be equal to 2 servings of carbs.  Check the number of grams (g) of saturated and trans fats in one serving. Choose foods that have low or no amount of these fats.  Check the number of milligrams (mg) of salt (sodium) in one serving. Most people should limit total sodium intake to less than 2,300 mg per day.  Always check the nutrition information of foods labeled as "low-fat" or "nonfat". These foods may be higher in added sugar or refined carbs and should be avoided.  Talk to your dietitian to identify your daily goals for nutrients listed on the label. Shopping  Avoid buying canned, premade, or processed foods. These foods tend to be high in fat, sodium, and added sugar.  Shop around the outside edge of the grocery store. This includes fresh fruits and vegetables, bulk grains, fresh meats, and fresh dairy. Cooking  Use low-heat cooking methods, such as baking, instead of high-heat cooking methods like deep frying.  Cook using healthy oils, such as olive, canola, or sunflower oil.  Avoid cooking with butter, cream, or high-fat meats. Meal planning  Eat meals and snacks regularly, preferably at the same times every day. Avoid going long periods of time without eating.  Eat foods high in fiber, such as fresh fruits, vegetables, beans, and whole grains. Talk to your dietitian about how many servings of carbs you can eat at each meal.  Eat 4-6 ounces (oz) of lean protein each day, such as lean meat, chicken, fish, eggs, or tofu. One oz of lean protein is equal to: ? 1 oz of meat, chicken, or fish. ? 1 egg. ?  cup of tofu.  Eat some foods each day that contain  healthy fats, such as avocado, nuts, seeds, and fish. Lifestyle  Check your blood glucose regularly.  Exercise regularly as told by your health care provider. This may include: ? 150 minutes of moderate-intensity or vigorous-intensity exercise each week. This could be brisk walking, biking, or water aerobics. ? Stretching and doing strength exercises, such as yoga or weightlifting, at least 2 times a week.  Take medicines as told by your health care provider.  Do not use any products that contain nicotine or tobacco, such as cigarettes and e-cigarettes. If you need help quitting, ask your health care provider.  Work with a counselor or diabetes educator to identify strategies to manage stress and any emotional and social challenges. Questions to ask a health care provider  Do I need to meet with a diabetes educator?  Do I need to meet with a dietitian?  What number can I call if I have questions?  When are the best times to   check my blood glucose? Where to find more information:  American Diabetes Association: diabetes.org  Academy of Nutrition and Dietetics: www.eatright.org  National Institute of Diabetes and Digestive and Kidney Diseases (NIH): www.niddk.nih.gov Summary  A healthy meal plan will help you control your blood glucose and maintain a healthy lifestyle.  Working with a diet and nutrition specialist (dietitian) can help you make a meal plan that is best for you.  Keep in mind that carbohydrates (carbs) and alcohol have immediate effects on your blood glucose levels. It is important to count carbs and to use alcohol carefully. This information is not intended to replace advice given to you by your health care provider. Make sure you discuss any questions you have with your health care provider. Document Released: 06/06/2005 Document Revised: 04/09/2017 Document Reviewed: 10/14/2016 Elsevier Interactive Patient Education  2019 [employer]  

## 2018-11-05 NOTE — Progress Notes (Signed)
Sue Baker 63 y.o.   Chief Complaint  Patient presents with  . Medication Refill    ALL except meds without RF beside them    HISTORY OF PRESENT ILLNESS: This is a 63 y.o. female here today for medication refill.  Patient has the following chronic medical problems: 1.  High triglycerides and cholesterol, on fenofibrate and Crestor. 2.  Hypothyroidism, on Synthroid. 3.  Chronic arthritis with chronic pain to her knees, takes Mobic. 4.  Hypertension, on Benicar. 5.  Diabetes.  Noncompliant with diet.  On saxagliptin/metformin.  HPI   Prior to Admission medications   Medication Sig Start Date End Date Taking? Authorizing Provider  aspirin 81 MG tablet Take 81 mg by mouth daily.   Yes [provider]  cyclobenzaprine (FLEXERIL) 10 MG tablet Take 0.5-1 tablets (5-10 mg total) by mouth 3 (three) times daily as needed for muscle spasms. **Needs office visit for any additional refills** 10/31/17  Yes Shawnee Knapp, MD  escitalopram (LEXAPRO) 10 MG tablet TAKE 1 TABLET BY MOUTH DAILY 10/01/18  Yes Delia Chimes A, MD  fenofibrate (TRICOR) 145 MG tablet Take 1 tablet (145 mg total) by mouth daily. 07/08/18  Yes Forrest Moron, MD  levothyroxine (SYNTHROID, LEVOTHROID) 175 MCG tablet Take 1 tablet (175 mcg total) by mouth daily before breakfast. 08/08/18  Yes Stallings, Zoe A, MD  meloxicam (MOBIC) 15 MG tablet Take 1 tablet (15 mg total) by mouth daily as needed for pain. 07/08/18  Yes Stallings, Zoe A, MD  olmesartan (BENICAR) 20 MG tablet TAKE 1 TABLET BY MOUTH EVERY DAY 10/02/18  Yes Stallings, Zoe A, MD  rosuvastatin (CRESTOR) 40 MG tablet Take 1 tablet (40 mg total) by mouth daily. 07/08/18  Yes Stallings, Zoe A, MD  Saxagliptin-Metformin (KOMBIGLYZE XR) 2.01-999 MG TB24 Take 2 tablets by mouth daily. 07/08/18  Yes Forrest Moron, MD  traMADol (ULTRAM) 50 MG tablet Take 1-2 tablets (50-100 mg total) by mouth every 8 (eight) hours as needed. **Needs office visit for any additional  refills.** 10/31/17  Yes Shawnee Knapp, MD  levothyroxine (SYNTHROID, LEVOTHROID) 150 MCG tablet TAKE 1 TABLET BY MOUTH DAILY BEFORE BREAKFAST Patient not taking: Reported on 11/05/2018 10/02/18   Forrest Moron, MD    No Known Allergies  Patient Active Problem List   Diagnosis Date Noted  . Noncompliance w/medication treatment due to intermit use of medication 11/18/2017  . Right sided sciatica 11/18/2017  . Arthritis 01/06/2015  . Hyperlipidemia LDL goal <70 02/08/2014  . OSA on CPAP 02/08/2014  . Morbid obesity ([place]) 02/08/2014  . Depression 02/08/2014  . Osteoarthritis of hand 05/21/2013  . Diabetes mellitus ([place]) 09/29/2012  . Polypharmacy 09/29/2012  . Hypertension 09/29/2012  . Arthritis of both knees 09/29/2012  . Irregular menses 09/29/2012  . Hypothyroid 09/29/2012  . GERD (gastroesophageal reflux disease) 09/29/2012  . Mood disorder ([place]) 09/29/2012    Past Medical History:  Diagnosis Date  . Allergy   . Arthritis   . Cataract   . Diabetes mellitus without complication ([place])   . GERD (gastroesophageal reflux disease)   . Hypertension   . Thyroid disease     Past Surgical History:  Procedure Laterality Date  . BREAST SURGERY    . CARPAL TUNNEL RELEASE    . EYE SURGERY    . KNEE SURGERY     left  . TUBAL LIGATION      Social History   Socioeconomic History  . Marital status: Single  Spouse name: Not on file  . Number of children: Not on file  . Years of education: 15  . Highest education level: Some college, no degree  Occupational History    Employer: [employer]  Social Needs  . Financial resource strain: Not on file  . Food insecurity:    Worry: Not on file    Inability: Not on file  . Transportation needs:    Medical: Not on file    Non-medical: Not on file  Tobacco Use  . Smoking status: Former Smoker    Last attempt to quit: 09/30/1999    Years since quitting: 19.1  . Smokeless tobacco: Never Used  Substance and Sexual Activity    . Alcohol use: Yes    Comment: rarely  . Drug use: No  . Sexual activity: Not Currently  Lifestyle  . Physical activity:    Days per week: Not on file    Minutes per session: Not on file  . Stress: Not on file  Relationships  . Social connections:    Talks on phone: Not on file    Gets together: Not on file    Attends religious service: Not on file    Active member of club or organization: Not on file    Attends meetings of clubs or organizations: Not on file    Relationship status: Not on file  . Intimate partner violence:    Fear of current or ex partner: Not on file    Emotionally abused: Not on file    Physically abused: Not on file    Forced sexual activity: Not on file  Other Topics Concern  . Not on file  Social History Narrative   Lives in a one story home.  Her daughter and grandson live with her.  Works at [employer]. 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  Education: some college.     Family History  Problem Relation Age of Onset  . Healthy Mother   . Heart disease Father   . Peripheral Artery Disease Father   . Thyroid disease Sister      Review of Systems  Constitutional: Negative.  Negative for fever.  HENT: Negative.   Eyes: Negative.   Respiratory: Negative.  Negative for cough and shortness of breath.   Cardiovascular: Negative.  Negative for chest pain and palpitations.  Gastrointestinal: Negative.  Negative for abdominal pain, diarrhea, nausea and vomiting.  Genitourinary: Negative.   Musculoskeletal: Positive for joint pain.  Skin: Negative.   Neurological: Negative for dizziness and headaches.  Endo/Heme/Allergies: Negative.   All other systems reviewed and are negative.   Vitals:   11/05/18 0947  BP: 121/73  Pulse: 75  Resp: 16  Temp: 98.4 F (36.9 C)  SpO2: 96%    Physical Exam Vitals signs reviewed.  Constitutional:      Appearance: She is obese.  HENT:     Head: Normocephalic and atraumatic.     Mouth/Throat:      Mouth: Mucous membranes are moist.     Pharynx: Oropharynx is clear.  Eyes:     Extraocular Movements: Extraocular movements intact.     Conjunctiva/sclera: Conjunctivae normal.     Pupils: Pupils are equal, round, and reactive to light.  Neck:     Musculoskeletal: Normal range of motion and neck supple.  Cardiovascular:     Rate and Rhythm: Normal rate and regular rhythm.     Heart sounds: Normal heart sounds.  Pulmonary:  Effort: Pulmonary effort is normal.     Breath sounds: Normal breath sounds.  Abdominal:     General: There is no distension.     Palpations: Abdomen is soft.     Tenderness: There is no abdominal tenderness.  Musculoskeletal: Normal range of motion.     Comments: Knee brace on left knee  Skin:    General: Skin is warm and dry.     Capillary Refill: Capillary refill takes less than 2 seconds.  Neurological:     General: No focal deficit present.     Mental Status: She is alert and oriented to person, place, and time.    Results for orders placed or performed in visit on 11/05/18 (from the past 24 hour(s))  POCT glucose (manual entry)     Status: Abnormal   Collection Time: 11/05/18 10:50 AM  Result Value Ref Range   POC Glucose 236 (A) 70 - 99 mg/dl  POCT glycosylated hemoglobin (Hb A1C)     Status: Abnormal   Collection Time: 11/05/18 10:50 AM  Result Value Ref Range   Hemoglobin A1C 10.6 (A) 4.0 - 5.6 %   HbA1c POC (<> result, manual entry)     HbA1c, POC (prediabetic range)     HbA1c, POC (controlled diabetic range)       ASSESSMENT & PLAN: Diabetes mellitus Uncontrolled diabetes with hemoglobin A1c at 10.6 today.  Patient has been noncompliant with nutrition.  Will continue present medication, encouraged good nutrition and exercise.  Follow-up in 3 months with PCP.  [place] was seen today for medication refill.  Diagnoses and all orders for this visit:  Type 2 diabetes mellitus with hyperglycemia, without long-term current use of  insulin (HCC) -     POCT glucose (manual entry) -     POCT glycosylated hemoglobin (Hb A1C) -     Comprehensive metabolic panel  Acquired hypothyroidism -     Thyroid Panel With TSH  Other orders -     levothyroxine (SYNTHROID, LEVOTHROID) 175 MCG tablet; Take 1 tablet (175 mcg total) by mouth daily before breakfast. -     Saxagliptin-Metformin (KOMBIGLYZE XR) 2.01-999 MG TB24; Take 2 tablets by mouth daily. -     rosuvastatin (CRESTOR) 40 MG tablet; Take 1 tablet (40 mg total) by mouth daily. -     fenofibrate (TRICOR) 145 MG tablet; Take 1 tablet (145 mg total) by mouth daily. -     meloxicam (MOBIC) 15 MG tablet; Take 1 tablet (15 mg total) by mouth daily as needed for pain. -     olmesartan (BENICAR) 20 MG tablet; Take 1 tablet (20 mg total) by mouth daily.    Patient Instructions       If you have lab work done today you will be contacted with your lab results within the next 2 weeks.  If you have not heard from [place] then please contact us. The fastest way to get your results is to register for My Chart.   IF you received an x-ray today, you will receive an invoice from [facility]. Please contact [facility] at [phone] with questions or concerns regarding your invoice.   IF you received labwork today, you will receive an invoice from [facility]. Please contact [facility] at [phone] with questions or concerns regarding your invoice.   Our billing staff will not be able to assist you with questions regarding bills from these companies.  You will be contacted with the lab results as soon as they are available.  The fastest way to get your results is to activate your My Chart account. Instructions are located on the last page of this paperwork. If you have not heard from [place] regarding the results in 2 weeks, please contact this office.     Diabetes Mellitus and Nutrition, Adult When you have diabetes (diabetes mellitus), it is very important to have healthy  eating habits because your blood sugar (glucose) levels are greatly affected by what you eat and drink. Eating healthy foods in the appropriate amounts, at about the same times every day, can help you:  Control your blood glucose.  Lower your risk of heart disease.  Improve your blood pressure.  Reach or maintain a healthy weight. Every person with diabetes is different, and each person has different needs for a meal plan. Your health care provider may recommend that you work with a diet and nutrition specialist (dietitian) to make a meal plan that is best for you. Your meal plan may vary depending on factors such as:  The calories you need.  The medicines you take.  Your weight.  Your blood glucose, blood pressure, and cholesterol levels.  Your activity level.  Other health conditions you have, such as heart or kidney disease. How do carbohydrates affect me? Carbohydrates, also called carbs, affect your blood glucose level more than any other type of food. Eating carbs naturally raises the amount of glucose in your blood. Carb counting is a method for keeping track of how many carbs you eat. Counting carbs is important to keep your blood glucose at a healthy level, especially if you use insulin or take certain oral diabetes medicines. It is important to know how many carbs you can safely have in each meal. This is different for every person. Your dietitian can help you calculate how many carbs you should have at each meal and for each snack. Foods that contain carbs include:  Bread, cereal, rice, pasta, and crackers.  Potatoes and corn.  Peas, beans, and lentils.  Milk and yogurt.  Fruit and juice.  Desserts, such as cakes, cookies, ice cream, and candy. How does alcohol affect me? Alcohol can cause a sudden decrease in blood glucose (hypoglycemia), especially if you use insulin or take certain oral diabetes medicines. Hypoglycemia can be a life-threatening condition. Symptoms  of hypoglycemia (sleepiness, dizziness, and confusion) are similar to symptoms of having too much alcohol. If your health care provider says that alcohol is safe for you, follow these guidelines:  Limit alcohol intake to no more than 1 drink per day for nonpregnant women and 2 drinks per day for men. One drink equals 12 oz of beer, 5 oz of wine, or 1 oz of hard liquor.  Do not drink on an empty stomach.  Keep yourself hydrated with water, diet soda, or unsweetened iced tea.  Keep in mind that regular soda, juice, and other mixers may contain a lot of sugar and must be counted as carbs. What are tips for following this plan?  Reading food labels  Start by checking the serving size on the "Nutrition Facts" label of packaged foods and drinks. The amount of calories, carbs, fats, and other nutrients listed on the label is based on one serving of the item. Many items contain more than one serving per package.  Check the total grams (g) of carbs in one serving. You can calculate the number of servings of carbs in one serving by dividing the total carbs by 15. For example, if a food  has 30 g of total carbs, it would be equal to 2 servings of carbs.  Check the number of grams (g) of saturated and trans fats in one serving. Choose foods that have low or no amount of these fats.  Check the number of milligrams (mg) of salt (sodium) in one serving. Most people should limit total sodium intake to less than 2,300 mg per day.  Always check the nutrition information of foods labeled as "low-fat" or "nonfat". These foods may be higher in added sugar or refined carbs and should be avoided.  Talk to your dietitian to identify your daily goals for nutrients listed on the label. Shopping  Avoid buying canned, premade, or processed foods. These foods tend to be high in fat, sodium, and added sugar.  Shop around the outside edge of the grocery store. This includes fresh fruits and vegetables, bulk grains,  fresh meats, and fresh dairy. Cooking  Use low-heat cooking methods, such as baking, instead of high-heat cooking methods like deep frying.  Cook using healthy oils, such as olive, canola, or sunflower oil.  Avoid cooking with butter, cream, or high-fat meats. Meal planning  Eat meals and snacks regularly, preferably at the same times every day. Avoid going long periods of time without eating.  Eat foods high in fiber, such as fresh fruits, vegetables, beans, and whole grains. Talk to your dietitian about how many servings of carbs you can eat at each meal.  Eat 4-6 ounces (oz) of lean protein each day, such as lean meat, chicken, fish, eggs, or tofu. One oz of lean protein is equal to: ? 1 oz of meat, chicken, or fish. ? 1 egg. ?  cup of tofu.  Eat some foods each day that contain healthy fats, such as avocado, nuts, seeds, and fish. Lifestyle  Check your blood glucose regularly.  Exercise regularly as told by your health care provider. This may include: ? 150 minutes of moderate-intensity or vigorous-intensity exercise each week. This could be brisk walking, biking, or water aerobics. ? Stretching and doing strength exercises, such as yoga or weightlifting, at least 2 times a week.  Take medicines as told by your health care provider.  Do not use any products that contain nicotine or tobacco, such as cigarettes and e-cigarettes. If you need help quitting, ask your health care provider.  Work with a Social worker or diabetes educator to identify strategies to manage stress and any emotional and social challenges. Questions to ask a health care provider  Do I need to meet with a diabetes educator?  Do I need to meet with a dietitian?  What number can I call if I have questions?  When are the best times to check my blood glucose? Where to find more information:  American Diabetes Association: diabetes.org  Academy of Nutrition and Dietetics: www.eatright.[employer] of Diabetes and Digestive and Kidney Diseases (NIH): [employer] Summary  A healthy meal plan will help you control your blood glucose and maintain a healthy lifestyle.  Working with a diet and nutrition specialist (dietitian) can help you make a meal plan that is best for you.  Keep in mind that carbohydrates (carbs) and alcohol have immediate effects on your blood glucose levels. It is important to count carbs and to use alcohol carefully. This information is not intended to replace advice given to you by your health care provider. Make sure you discuss any questions you have with your health care provider. Document Released: 06/06/2005 Document Revised: 04/09/2017  Document Reviewed: 10/14/2016 Elsevier Interactive Patient Education  2019 Elsevier Inc.      Agustina Caroli, MD [facility]

## 2018-11-05 NOTE — Assessment & Plan Note (Signed)
Uncontrolled diabetes with hemoglobin A1c at 10.6 today.  Patient has been noncompliant with nutrition.  Will continue present medication, encouraged good nutrition and exercise.  Follow-up in 3 months with PCP.

## 2018-11-06 LAB — THYROID PANEL WITH TSH
Free Thyroxine Index: 3.5 (ref 1.2–4.9)
T3 Uptake Ratio: 27 % (ref 24–39)
T4 TOTAL: 12.9 ug/dL — AB (ref 4.5–12.0)
TSH: 1.12 u[IU]/mL (ref 0.450–4.500)

## 2018-11-06 LAB — COMPREHENSIVE METABOLIC PANEL
ALBUMIN: 4.5 g/dL (ref 3.8–4.8)
ALK PHOS: 83 IU/L (ref 39–117)
ALT: 39 IU/L — ABNORMAL HIGH (ref 0–32)
AST: 46 IU/L — ABNORMAL HIGH (ref 0–40)
Albumin/Globulin Ratio: 1.7 (ref 1.2–2.2)
BILIRUBIN TOTAL: 0.6 mg/dL (ref 0.0–1.2)
BUN / CREAT RATIO: 23 (ref 12–28)
BUN: 17 mg/dL (ref 8–27)
CHLORIDE: 97 mmol/L (ref 96–106)
CO2: 23 mmol/L (ref 20–29)
Calcium: 10.1 mg/dL (ref 8.7–10.3)
Creatinine, Ser: 0.73 mg/dL (ref 0.57–1.00)
GFR calc non Af Amer: 89 mL/min/{1.73_m2} (ref >59)
GFR, EST AFRICAN AMERICAN: 103 mL/min/{1.73_m2} (ref >59)
GLUCOSE: 224 mg/dL — AB (ref 65–99)
Globulin, Total: 2.6 g/dL (ref 1.5–4.5)
Potassium: 4.5 mmol/L (ref 3.5–5.2)
SODIUM: 136 mmol/L (ref 134–144)
TOTAL PROTEIN: 7.1 g/dL (ref 6.0–8.5)

## 2018-12-01 DIAGNOSIS — M25562 Pain in left knee: Secondary | ICD-10-CM | POA: Diagnosis not present

## 2019-01-31 ENCOUNTER — Other Ambulatory Visit: Payer: Self-pay | Admitting: Family Medicine

## 2019-03-04 ENCOUNTER — Other Ambulatory Visit: Payer: Self-pay | Admitting: Emergency Medicine

## 2019-03-04 NOTE — Telephone Encounter (Signed)
Medication refill

## 2019-03-04 NOTE — Telephone Encounter (Signed)
Forwarding to PCP for review.  

## 2019-03-05 NOTE — Telephone Encounter (Signed)
Forwarding to PCP for review.  

## 2019-04-27 ENCOUNTER — Other Ambulatory Visit: Payer: Self-pay | Admitting: Emergency Medicine

## 2019-05-06 ENCOUNTER — Other Ambulatory Visit: Payer: Self-pay

## 2019-05-06 DIAGNOSIS — E039 Hypothyroidism, unspecified: Secondary | ICD-10-CM

## 2019-05-06 MED ORDER — LEVOTHYROXINE SODIUM 175 MCG PO TABS: 175.0000 ug | ORAL_TABLET | Freq: Every day | ORAL | 0 refills | Status: DC

## 2019-06-24 ENCOUNTER — Other Ambulatory Visit: Payer: Self-pay | Admitting: Family Medicine

## 2019-06-24 NOTE — Telephone Encounter (Signed)
Requested medication (s) are due for refill today: yes  Requested medication (s) are on the active medication list: yes  Last refill: 03/31/2019  Future visit scheduled:no  Notes to clinic:  Review for refill   Requested Prescriptions  Pending Prescriptions Disp Refills   escitalopram (LEXAPRO) 10 MG tablet [Pharmacy Med Name: ESCITALOPRAM 10 MG TABLET] 90 tablet 0    Sig: TAKE 1 TABLET BY MOUTH DAILY     Psychiatry:  Antidepressants - SSRI Failed - 06/24/2019  1:20 AM      Failed - Valid encounter within last 6 months    Recent Outpatient Visits          7 months ago Type 2 diabetes mellitus with hyperglycemia, without long-term current use of insulin [facility])   Primary Care at [facility], [place], [place]   10 months ago Acquired hypothyroidism   Primary Care at [facility], [place], [place]   11 months ago Type 2 diabetes mellitus with complication, without long-term current use of insulin ([place])   Primary Care at [facility], Zoe A, MD   1 year ago Type 2 diabetes mellitus with complication, without long-term current use of insulin [facility])   [facility] at [facility], Laurey Arrow, MD   2 years ago Hypothyroidism, unspecified type   Primary Care at [facility], Laurey Arrow, MD             Passed - Completed PHQ-2 or PHQ-9 in the last 360 days.

## 2019-06-26 ENCOUNTER — Emergency Department (HOSPITAL_COMMUNITY)
Admission: EM | Admit: 2019-06-26 | Discharge: 2019-06-26 | Disposition: A | Discharge status: Home / Self Care | Payer: 59 | Attending: Emergency Medicine | Admitting: Emergency Medicine

## 2019-06-26 ENCOUNTER — Encounter (HOSPITAL_COMMUNITY): Payer: Self-pay

## 2019-06-26 ENCOUNTER — Other Ambulatory Visit: Payer: Self-pay

## 2019-06-26 ENCOUNTER — Emergency Department (HOSPITAL_COMMUNITY): Payer: 59

## 2019-06-26 DIAGNOSIS — E039 Hypothyroidism, unspecified: Secondary | ICD-10-CM | POA: Insufficient documentation

## 2019-06-26 DIAGNOSIS — Y999 Unspecified external cause status: Secondary | ICD-10-CM | POA: Insufficient documentation

## 2019-06-26 DIAGNOSIS — Z7982 Long term (current) use of aspirin: Secondary | ICD-10-CM | POA: Diagnosis not present

## 2019-06-26 DIAGNOSIS — Z79899 Other long term (current) drug therapy: Secondary | ICD-10-CM | POA: Insufficient documentation

## 2019-06-26 DIAGNOSIS — W010XXA Fall on same level from slipping, tripping and stumbling without subsequent striking against object, initial encounter: Secondary | ICD-10-CM | POA: Diagnosis not present

## 2019-06-26 DIAGNOSIS — I10 Essential (primary) hypertension: Secondary | ICD-10-CM | POA: Insufficient documentation

## 2019-06-26 DIAGNOSIS — E119 Type 2 diabetes mellitus without complications: Secondary | ICD-10-CM | POA: Diagnosis not present

## 2019-06-26 DIAGNOSIS — S82191A Other fracture of upper end of right tibia, initial encounter for closed fracture: Secondary | ICD-10-CM | POA: Diagnosis not present

## 2019-06-26 DIAGNOSIS — Y929 Unspecified place or not applicable: Secondary | ICD-10-CM | POA: Insufficient documentation

## 2019-06-26 DIAGNOSIS — Z7984 Long term (current) use of oral hypoglycemic drugs: Secondary | ICD-10-CM | POA: Insufficient documentation

## 2019-06-26 DIAGNOSIS — Y939 Activity, unspecified: Secondary | ICD-10-CM | POA: Insufficient documentation

## 2019-06-26 DIAGNOSIS — Z87891 Personal history of nicotine dependence: Secondary | ICD-10-CM | POA: Diagnosis not present

## 2019-06-26 DIAGNOSIS — S80911A Unspecified superficial injury of right knee, initial encounter: Secondary | ICD-10-CM | POA: Diagnosis present

## 2019-06-26 IMAGING — DX DG TIBIA/FIBULA 2V*R*
2 series · 2 of 2 positions shown · non-contrast
Comparison: None.

CLINICAL DATA: Trip and fall with right knee pain

EXAM:
RIGHT TIBIA AND FIBULA - 2 VIEW

[x tib-fib lat right]
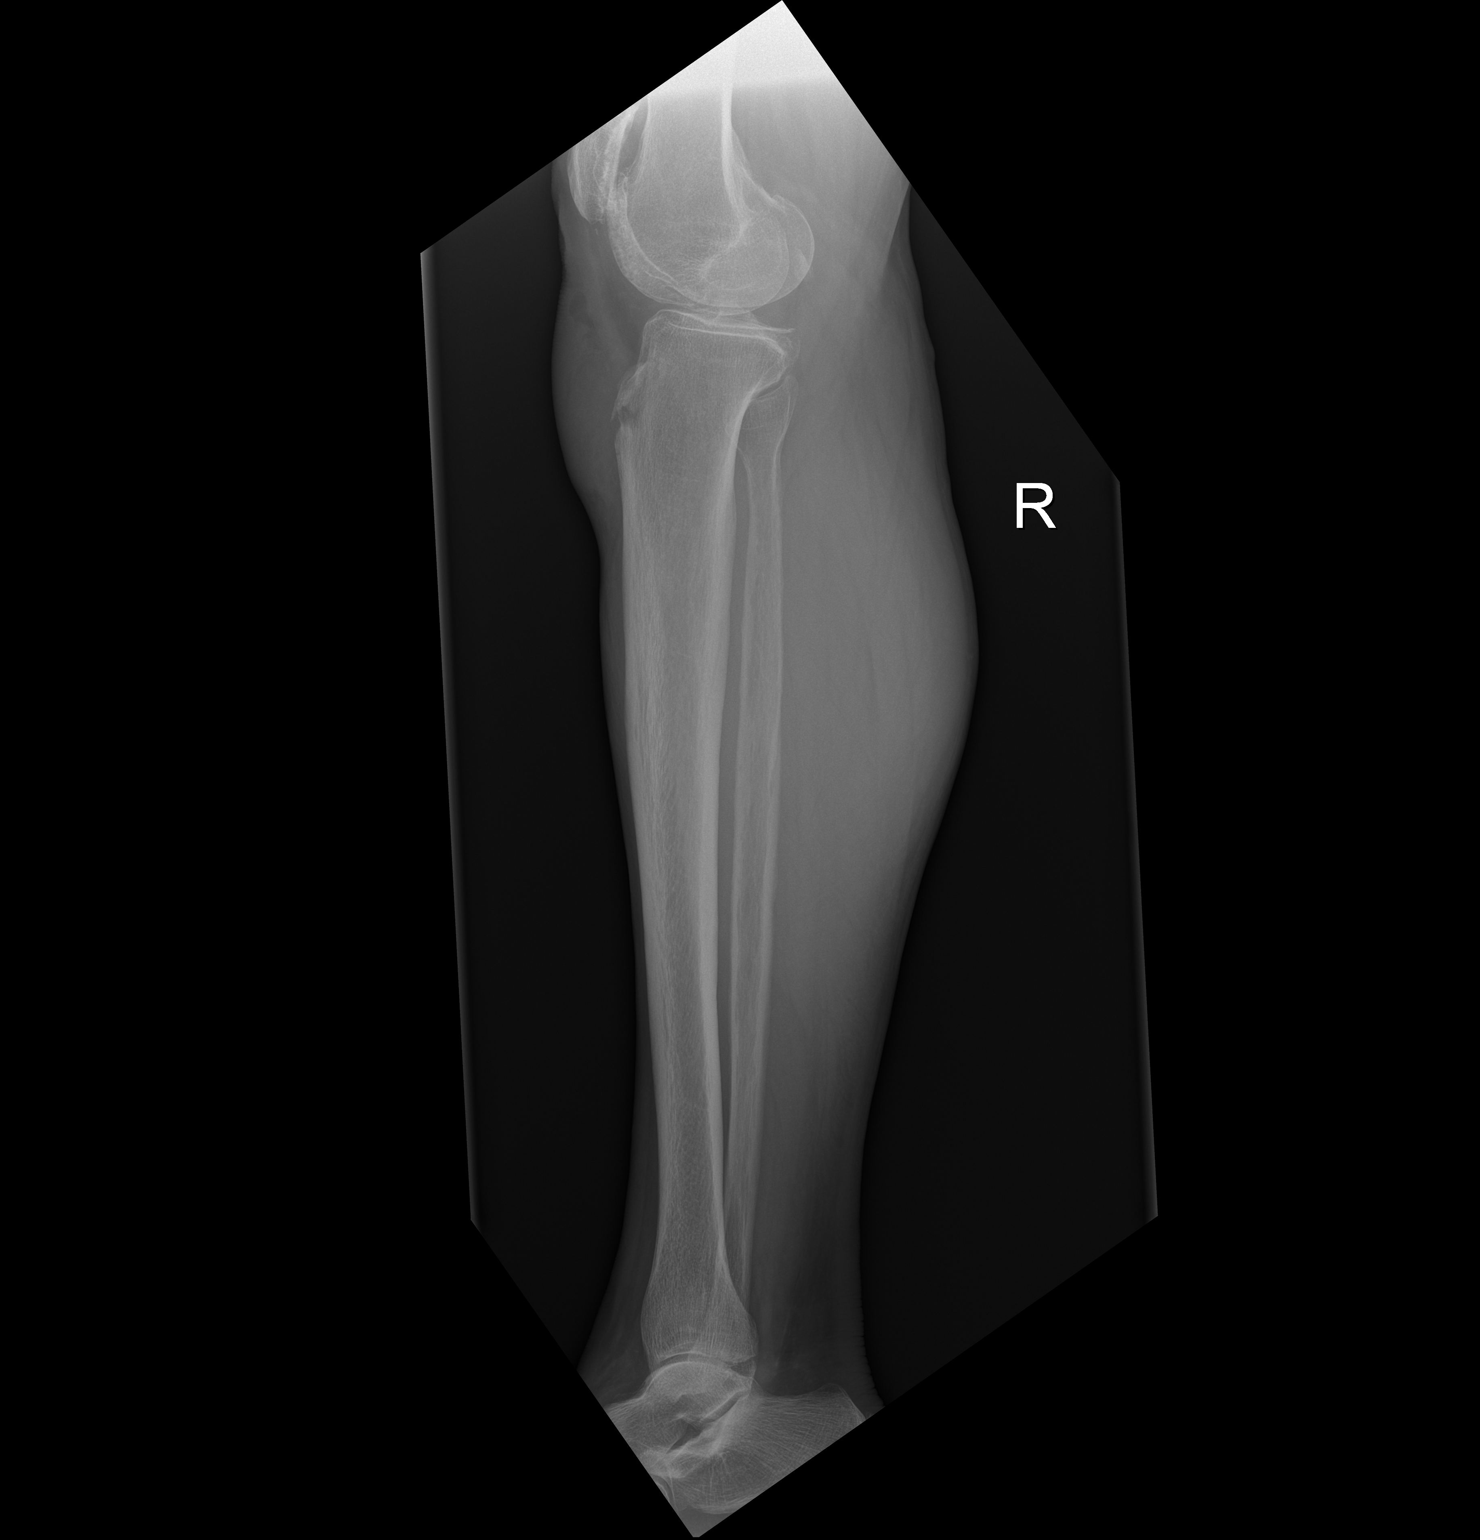

[x tib-fib ap right]
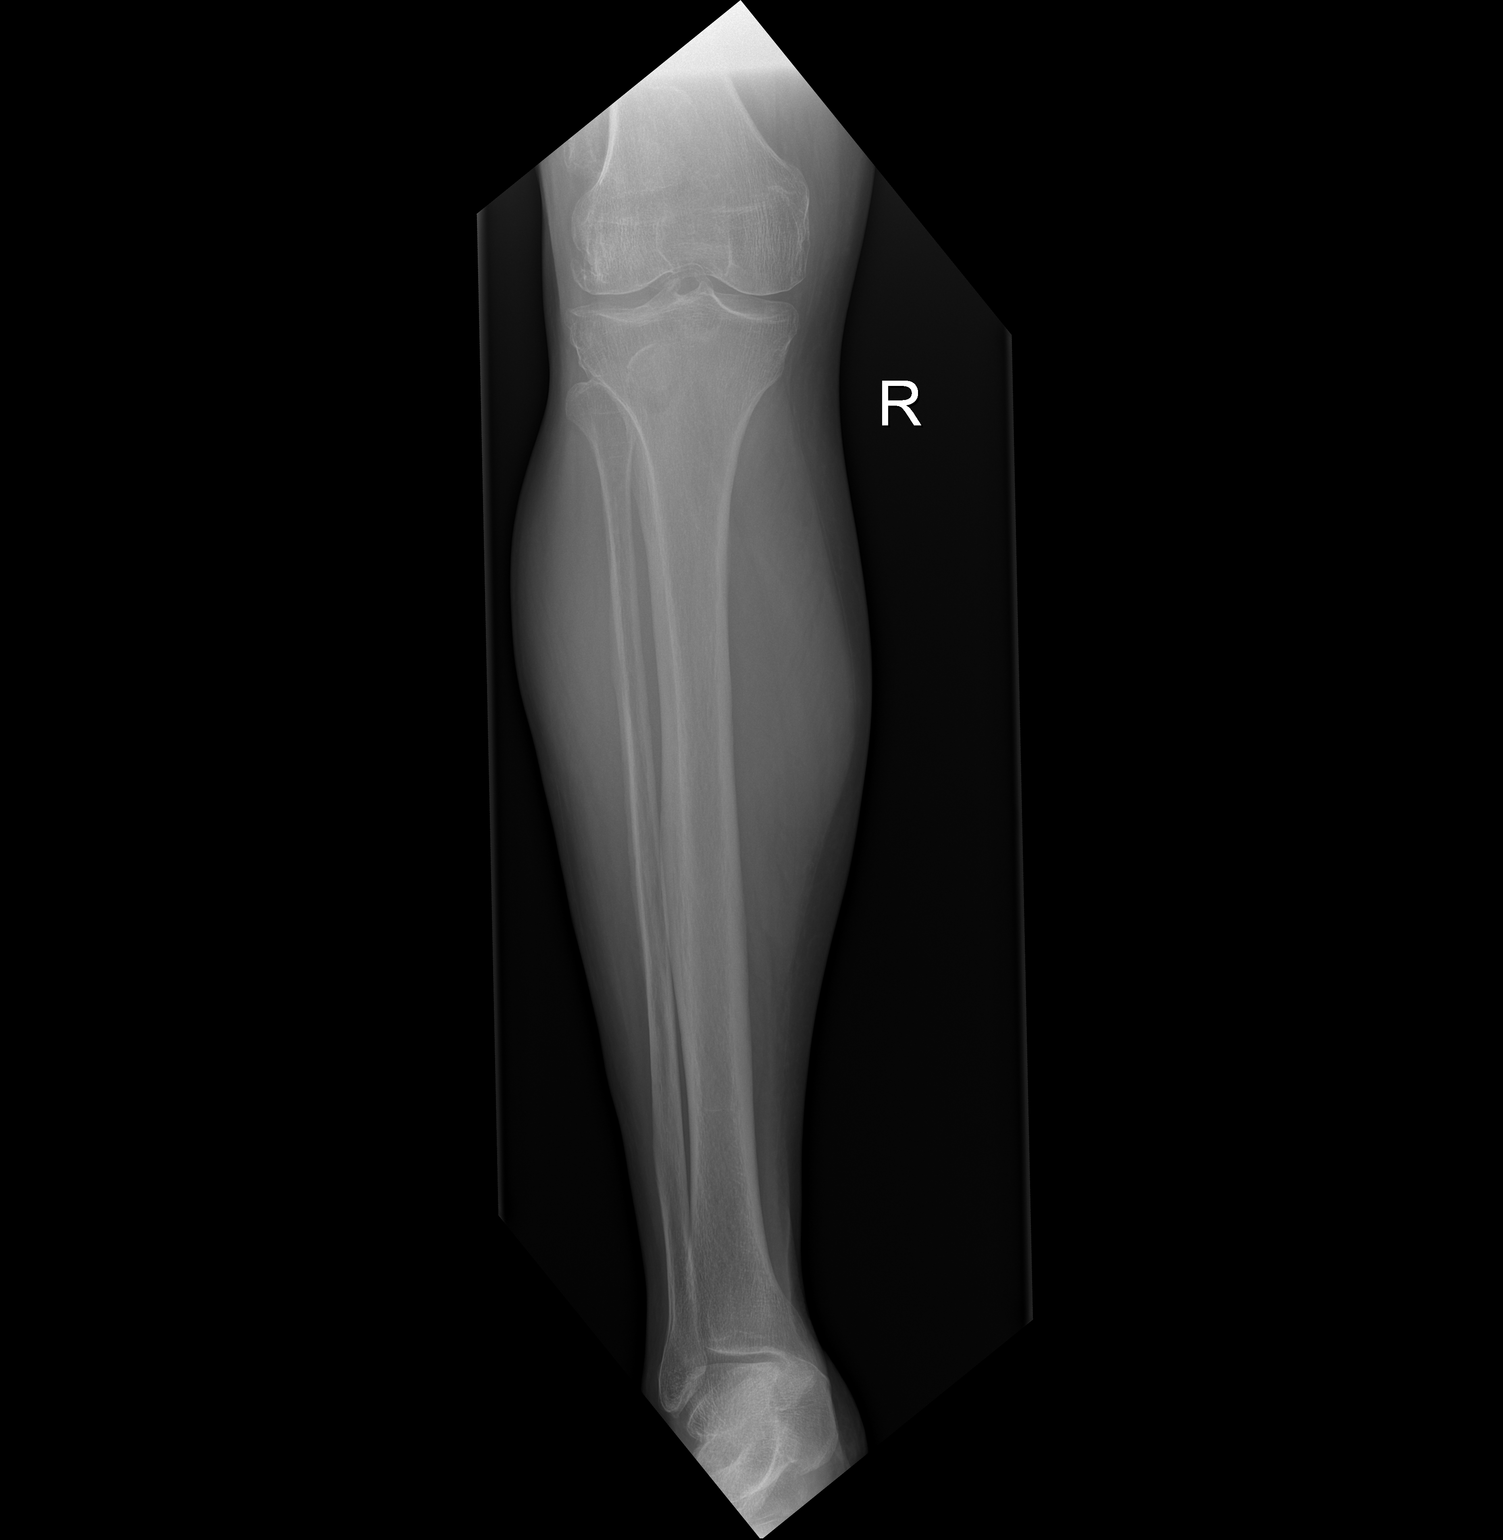

[2 of 2 positions shown; findings below may reference images not displayed]

FINDINGS: There is an avulsion fracture at the right tibial tubercle without
significant displacement, with prominent overlying soft tissue
swelling. No additional fracture. No suspicious focal osseous
lesions. Lateral compartment right knee osteoarthritis. No
radiopaque foreign body.
IMPRESSION: Right tibial tubercle avulsion fracture with prominent overlying
soft tissue swelling.

## 2019-06-26 MED ORDER — HYDROCODONE-ACETAMINOPHEN 5-325 MG PO TABS: 1.0000 | ORAL_TABLET | Freq: Four times a day (QID) | ORAL | 0 refills | Status: DC | PRN

## 2019-06-26 MED ORDER — MORPHINE SULFATE (PF) 4 MG/ML IV SOLN
4.0000 mg | Freq: Once | INTRAVENOUS | Status: AC
  Administered 2019-06-26: 4 mg via INTRAVENOUS
  Filled 2019-06-26: qty 1

## 2019-06-26 NOTE — ED Notes (Signed)
Patient transported to X-ray 

## 2019-06-26 NOTE — ED Notes (Signed)
Discharge instructions including ortho surgery follow up Monday and pain management discussed with pt. Pt verbalized understanding with no questions at this time.

## 2019-06-26 NOTE — ED Notes (Signed)
Knee immobilizer applied by ortho tech. Pt is not a candidate for crutches d/t safety concern. Pt uses cane to ambulate at home

## 2019-06-26 NOTE — ED Provider Notes (Signed)
[facility] EMERGENCY DEPARTMENT Provider Note   CSN: [id number] Arrival date & time: 06/26/19  1844     History   Chief Complaint Chief Complaint  Patient presents with  . Leg Pain  . Fall    HPI Sue Baker is a 63 y.o. female.     63 year old female with prior medical history as detailed below presents for evaluation of right knee injury.  Patient reports that she tripped over a dog gate.  She landed hard on the right knee.  She complains of pain to the anterior aspect of the right knee.  She was unable to ambulate after the fall secondary to pain.  She denies other injury.  She denies head injury, loss of conscious, neck pain, chest pain, shortness of breath, or other acute complaint.  Her pain is slightly decreased now after fentanyl administration by EMS.  The history is provided by the patient and medical records.  Leg Pain Location:  Knee Time since incident:  1 hour Knee location:  R knee Pain details:    Quality:  Aching   Severity:  Moderate   Onset quality:  Sudden   Duration:  1 hour   Timing:  Constant Chronicity:  New Dislocation: no   Worsened by:  Nothing Ineffective treatments:  None tried Fall    Past Medical History:  Diagnosis Date  . Allergy   . Arthritis   . Cataract   . Diabetes mellitus without complication ([place])   . GERD (gastroesophageal reflux disease)   . Hypertension   . Thyroid disease     Patient Active Problem List   Diagnosis Date Noted  . Noncompliance w/medication treatment due to intermit use of medication 11/18/2017  . Arthritis 01/06/2015  . Hyperlipidemia LDL goal <70 02/08/2014  . OSA on CPAP 02/08/2014  . Morbid obesity ([place]) 02/08/2014  . Depression 02/08/2014  . Osteoarthritis of hand 05/21/2013  . Diabetes mellitus ([place]) 09/29/2012  . Polypharmacy 09/29/2012  . Hypertension 09/29/2012  . Arthritis of both knees 09/29/2012  . Irregular menses 09/29/2012  . Hypothyroid 09/29/2012  . GERD  (gastroesophageal reflux disease) 09/29/2012  . Mood disorder ([place]) 09/29/2012    Past Surgical History:  Procedure Laterality Date  . BREAST SURGERY    . CARPAL TUNNEL RELEASE    . EYE SURGERY    . KNEE SURGERY     left  . TUBAL LIGATION       OB History   No obstetric history on file.      Home Medications    Prior to Admission medications   Medication Sig Start Date End Date Taking? Authorizing Provider  aspirin 81 MG tablet Take 81 mg by mouth daily.    [provider]  cyclobenzaprine (FLEXERIL) 10 MG tablet Take 0.5-1 tablets (5-10 mg total) by mouth 3 (three) times daily as needed for muscle spasms. **Needs office visit for any additional refills** 10/31/17   Shawnee Knapp, MD  escitalopram (LEXAPRO) 10 MG tablet TAKE 1 TABLET BY MOUTH DAILY 06/24/19   Delia Chimes A, MD  fenofibrate (TRICOR) 145 MG tablet Take 1 tablet (145 mg total) by mouth daily. 11/05/18   Horald Pollen, MD  levothyroxine (SYNTHROID) 175 MCG tablet Take 1 tablet (175 mcg total) by mouth daily before breakfast. 05/06/19   Forrest Moron, MD  meloxicam (MOBIC) 15 MG tablet TAKE 1 TABLET BY MOUTH EVERY DAY AS NEEDED FOR PAIN 03/08/19   Forrest Moron, MD  olmesartan [facility])  20 MG tablet Take 1 tablet (20 mg total) by mouth daily. 11/05/18 02/03/19  Horald Pollen, MD  rosuvastatin (CRESTOR) 40 MG tablet Take 1 tablet (40 mg total) by mouth daily. 11/05/18   Horald Pollen, MD  Saxagliptin-Metformin (KOMBIGLYZE XR) 2.01-999 MG TB24 Take 2 tablets by mouth daily. 11/05/18   Horald Pollen, MD  traMADol (ULTRAM) 50 MG tablet Take 1-2 tablets (50-100 mg total) by mouth every 8 (eight) hours as needed. **Needs office visit for any additional refills.** 10/31/17   Shawnee Knapp, MD    Family History Family History  Problem Relation Age of Onset  . Healthy Mother   . Heart disease Father   . Peripheral Artery Disease Father   . Thyroid disease Sister     Social History  Social History   Tobacco Use  . Smoking status: Former Smoker    Quit date: 09/30/1999    Years since quitting: 19.7  . Smokeless tobacco: Never Used  Substance Use Topics  . Alcohol use: Yes    Comment: rarely  . Drug use: No     Allergies   Patient has no known allergies.   Review of Systems Review of Systems  All other systems reviewed and are negative.    Physical Exam Updated Vital Signs There were no vitals taken for this visit.  Physical Exam Vitals signs and nursing note reviewed.  Constitutional:      General: She is not in acute distress.    Appearance: She is well-developed.  HENT:     Head: Normocephalic and atraumatic.  Eyes:     Conjunctiva/sclera: Conjunctivae normal.     Pupils: Pupils are equal, round, and reactive to light.  Neck:     Musculoskeletal: Normal range of motion and neck supple.  Cardiovascular:     Rate and Rhythm: Normal rate and regular rhythm.     Heart sounds: Normal heart sounds.  Pulmonary:     Effort: Pulmonary effort is normal. No respiratory distress.     Breath sounds: Normal breath sounds.  Abdominal:     General: There is no distension.     Palpations: Abdomen is soft.     Tenderness: There is no abdominal tenderness.  Musculoskeletal: Normal range of motion.        General: No deformity.     Comments: Moderate ecchymosis and edema to the anterior aspect of the right knee.  She is tender at this area as well.  Distal right lower extremity is neurovascular intact.  No evidence of dislocation on exam.  Skin:    General: Skin is warm and dry.  Neurological:     Mental Status: She is alert and oriented to person, place, and time.      ED Treatments / Results  Labs (all labs ordered are listed, but only abnormal results are displayed) Labs Reviewed - No data to display  EKG None  Radiology Dg Tibia/fibula Right  Result Date: 06/26/2019 CLINICAL DATA:  Trip and fall with right knee pain EXAM: RIGHT TIBIA AND  FIBULA - 2 VIEW COMPARISON:  None. FINDINGS: There is an avulsion fracture at the right tibial tubercle without significant displacement, with prominent overlying soft tissue swelling. No additional fracture. No suspicious focal osseous lesions. Lateral compartment right knee osteoarthritis. No radiopaque foreign body. IMPRESSION: Right tibial tubercle avulsion fracture with prominent overlying soft tissue swelling. Electronically Signed   By: Ilona Sorrel M.D.   On: 06/26/2019 19:56   Dg Knee Complete  4 Views Right  Result Date: 06/26/2019 CLINICAL DATA:  Right knee pain after fall EXAM: RIGHT KNEE - COMPLETE 4+ VIEW COMPARISON:  None. FINDINGS: Minimally displaced avulsion fracture of the tibial tubercle with prominent overlying soft tissue swelling. No additional fracture. No right knee dislocation. No joint effusion. Intra-articular loose osseous bodies in the suprapatellar right knee joint space. Mild mediolateral compartment and severe patellofemoral compartment osteoarthritis. No suspicious focal osseous lesions. No radiopaque foreign bodies. IMPRESSION: 1. Minimally displaced avulsion fracture of the right tibial tubercle with prominent overlying soft tissue swelling. 2. Right knee osteoarthritis, severe in the patellofemoral compartment. 3. Intra-articular loose osseous bodies in the suprapatellar right knee joint space. Electronically Signed   By: Ilona Sorrel M.D.   On: 06/26/2019 19:58    Procedures Procedures (including critical care time)  Medications Ordered in ED Medications  morphine 4 MG/ML injection 4 mg (has no administration in time range)     Initial Impression / Assessment and Plan / ED Course  I have reviewed the triage vital signs and the nursing notes.  Pertinent labs & imaging results that were available during my care of the patient were reviewed by me and considered in my medical decision making (see chart for details).        MDM  Screen complete  Solstice Tsay was evaluated in Emergency Department on 06/26/2019 for the symptoms described in the history of present illness. She was evaluated in the context of the global COVID-19 pandemic, which necessitated consideration that the patient might be at risk for infection with the SARS-CoV-2 virus that causes COVID-19. Institutional protocols and algorithms that pertain to the evaluation of patients at risk for COVID-19 are in a state of rapid change based on information released by regulatory bodies including the CDC and federal and state organizations. These policies and algorithms were followed during the patient's care in the ED.  Patient is presenting for evaluation of right knee pain following fall.  X-ray demonstrates proximal tibial avulsion fracture.  Case discussed with Dr. Percell Miller of orthopedics.  He agrees with plan for discharge and outpatient follow-up.  She understands need for close follow-up on Monday with Dr. Percell Miller.  Importance of close follow-up is stressed.  Strict return cautions given and understood.  Final Clinical Impressions(s) / ED Diagnoses   Final diagnoses:  Other closed fracture of proximal end of right tibia, initial encounter    ED Discharge Orders         Ordered    HYDROcodone-acetaminophen (NORCO/VICODIN) 5-325 MG tablet  Every 6 hours PRN     06/26/19 2123           Valarie Merino, MD 06/26/19 2124

## 2019-06-26 NOTE — ED Triage Notes (Signed)
Pt fell after tripping over her doggy gate, she denies hitting her head or LOC, She has a swollen area below her Rt knee cap. She is A/Ox4 upon arrival to ED. EMS reports hypotension of SBP in the 90's, they gave a 400cc bolus & her SBP increased to 124. First BP at ED is 114/55.

## 2019-06-26 NOTE — ED Notes (Signed)
Ortho Tech called at this time for request on knee immobilizer

## 2019-06-26 NOTE — Progress Notes (Signed)
Orthopedic Tech Progress Note Patient Details:  Sue Baker 1956-08-27 [id number]  Ortho Devices Type of Ortho Device: Knee Immobilizer Ortho Device/Splint Location: RLE Ortho Device/Splint Interventions: Ordered, Application   Post Interventions Patient Tolerated: Well Instructions Provided: Care of device   Braulio Bosch 06/26/2019, 9:08 PM

## 2019-06-26 NOTE — Discharge Instructions (Signed)
Please return for any problem.  Follow-up with Dr. Percell Miller of orthopedics as instructed on Monday.   Use crutches and splint as instructed.  Do not bear weight on your right leg until seen by orthopedics.

## 2019-06-28 ENCOUNTER — Ambulatory Visit: Payer: 59 | Admitting: Family Medicine

## 2019-08-11 ENCOUNTER — Other Ambulatory Visit: Payer: Self-pay | Admitting: Family Medicine

## 2019-08-11 DIAGNOSIS — E039 Hypothyroidism, unspecified: Secondary | ICD-10-CM

## 2019-08-26 LAB — HM DIABETES EYE EXAM

## 2019-09-07 ENCOUNTER — Other Ambulatory Visit: Payer: Self-pay | Admitting: Emergency Medicine

## 2019-09-07 NOTE — Telephone Encounter (Signed)
No further refills without office visit 

## 2019-09-07 NOTE — Telephone Encounter (Signed)
Requested medication (s) are due for refill today: yes  Requested medication (s) are on the active medication list: yes  Last refill:  06/14/2019  Future visit scheduled: no  Notes to clinic:  overdue for office visit    Requested Prescriptions  Pending Prescriptions Disp Refills   olmesartan (BENICAR) 20 MG tablet [Pharmacy Med Name: OLMESARTAN MEDOXOMIL 20 MG TAB] 90 tablet 1    Sig: TAKE 1 TABLET BY MOUTH EVERY DAY      Cardiovascular:  Angiotensin Receptor Blockers Failed - 09/07/2019  2:20 AM      Failed - Cr in normal range and within 180 days    Creat  Date Value Ref Range Status  06/20/2016 1.04 0.50 - 1.05 mg/dL Final    Comment:      For patients > or = 61 years of age: The upper reference limit for Creatinine is approximately 13% higher for people identified as African-American.      Creatinine, Ser  Date Value Ref Range Status  11/05/2018 0.73 0.57 - 1.00 mg/dL Final   Creatinine, Urine  Date Value Ref Range Status  06/20/2016 129 20 - 320 mg/dL Final          Failed - K in normal range and within 180 days    Potassium  Date Value Ref Range Status  11/05/2018 4.5 3.5 - 5.2 mmol/L Final          Failed - Valid encounter within last 6 months    Recent Outpatient Visits           10 months ago Type 2 diabetes mellitus with hyperglycemia, without long-term current use of insulin [facility])   Primary Care at [facility], Ines Bloomer, MD   1 year ago Acquired hypothyroidism   Primary Care at [facility], [place] A, MD   1 year ago Type 2 diabetes mellitus with complication, without long-term current use of insulin ([place])   Primary Care at [facility], Zoe A, MD   1 year ago Type 2 diabetes mellitus with complication, without long-term current use of insulin [facility])   [facility] at [facility], Laurey Arrow, MD   2 years ago Hypothyroidism, unspecified type   Primary Care at [facility], Laurey Arrow, MD              Passed - Patient is not pregnant     Passed - Last BP in normal range    BP Readings from Last 1 Encounters:  06/26/19 (!) 108/50

## 2019-09-20 ENCOUNTER — Other Ambulatory Visit: Payer: Self-pay | Admitting: Family Medicine

## 2019-09-20 NOTE — Telephone Encounter (Signed)
Pt is overdue appt - routing to [place]

## 2019-11-03 ENCOUNTER — Other Ambulatory Visit: Payer: Self-pay | Admitting: Family Medicine

## 2019-11-03 DIAGNOSIS — E039 Hypothyroidism, unspecified: Secondary | ICD-10-CM

## 2019-11-03 NOTE — Telephone Encounter (Signed)
Scheduled

## 2019-11-03 NOTE — Telephone Encounter (Signed)
Called 2x , no answer and voicemail not setup

## 2019-11-03 NOTE — Telephone Encounter (Signed)
No further refills without office visit 

## 2019-11-03 NOTE — Telephone Encounter (Signed)
Requested medication (s) are due for refill today: yes  Requested medication (s) are on the active medication list: yes  Last refill:  08/11/2019  Future visit scheduled: no  Notes to clinic:  no valid encounter within last 12 months   Requested Prescriptions  Pending Prescriptions Disp Refills   levothyroxine (SYNTHROID) 175 MCG tablet [Pharmacy Med Name: LEVOTHYROXINE 175 MCG TABLET] 90 tablet 0    Sig: TAKE 1 TABLET BY MOUTH DAILY BEFORE BREAKFAST      Endocrinology:  Hypothyroid Agents Failed - 11/03/2019  2:23 AM      Failed - TSH needs to be rechecked within 3 months after an abnormal result. Refill until TSH is due.      Failed - TSH in normal range and within 360 days    TSH  Date Value Ref Range Status  11/05/2018 1.120 0.450 - 4.500 uIU/mL Final          Failed - Valid encounter within last 12 months    Recent Outpatient Visits           12 months ago Type 2 diabetes mellitus with hyperglycemia, without long-term current use of insulin [facility])   Primary Care at [facility], Ines Bloomer, MD   1 year ago Acquired hypothyroidism   Primary Care at [facility], [place] A, MD   1 year ago Type 2 diabetes mellitus with complication, without long-term current use of insulin [facility])   Primary Care at [facility], Zoe A, MD   1 year ago Type 2 diabetes mellitus with complication, without long-term current use of insulin [facility])   Primary Care at [facility], Laurey Arrow, MD   2 years ago Hypothyroidism, unspecified type   Primary Care at [facility], Laurey Arrow, MD

## 2019-11-03 NOTE — Telephone Encounter (Signed)
Please schedule 6 month f/u for any further refills

## 2019-11-19 ENCOUNTER — Other Ambulatory Visit: Payer: Self-pay

## 2019-11-19 ENCOUNTER — Ambulatory Visit: Payer: 59 | Admitting: Family Medicine

## 2019-11-19 ENCOUNTER — Encounter: Payer: Self-pay | Admitting: Family Medicine

## 2019-11-19 VITALS — BP 110/60 | HR 64 | Temp 98.0°F | Ht 64.0 in | Wt 209.8 lb

## 2019-11-19 DIAGNOSIS — E039 Hypothyroidism, unspecified: Secondary | ICD-10-CM | POA: Diagnosis not present

## 2019-11-19 DIAGNOSIS — Z1231 Encounter for screening mammogram for malignant neoplasm of breast: Secondary | ICD-10-CM | POA: Diagnosis not present

## 2019-11-19 DIAGNOSIS — R634 Abnormal weight loss: Secondary | ICD-10-CM

## 2019-11-19 DIAGNOSIS — E1165 Type 2 diabetes mellitus with hyperglycemia: Secondary | ICD-10-CM | POA: Diagnosis not present

## 2019-11-19 LAB — POCT GLYCOSYLATED HEMOGLOBIN (HGB A1C): Hemoglobin A1C: 11.2 % — AB (ref 4.0–5.6)

## 2019-11-19 MED ORDER — OLMESARTAN MEDOXOMIL 20 MG PO TABS: 20.0000 mg | ORAL_TABLET | Freq: Every day | ORAL | 0 refills | Status: DC

## 2019-11-19 MED ORDER — TRAMADOL HCL 50 MG PO TABS: 50.0000 mg | ORAL_TABLET | Freq: Three times a day (TID) | ORAL | 0 refills | Status: DC | PRN

## 2019-11-19 MED ORDER — FENOFIBRATE 145 MG PO TABS: 145.0000 mg | ORAL_TABLET | Freq: Every day | ORAL | 1 refills | Status: AC

## 2019-11-19 MED ORDER — ROSUVASTATIN CALCIUM 40 MG PO TABS: 40.0000 mg | ORAL_TABLET | Freq: Every day | ORAL | 1 refills | Status: AC

## 2019-11-19 MED ORDER — TRULICITY 0.75 MG/0.5ML ~~LOC~~ SOAJ: 0.7500 mg | SUBCUTANEOUS | 0 refills | Status: DC

## 2019-11-19 MED ORDER — MELOXICAM 15 MG PO TABS: ORAL_TABLET | ORAL | 0 refills | Status: DC

## 2019-11-19 MED ORDER — KOMBIGLYZE XR 2.5-1000 MG PO TB24: 2.0000 | ORAL_TABLET | Freq: Every day | ORAL | 1 refills | Status: DC

## 2019-11-19 NOTE — Patient Instructions (Signed)
° ° ° °  If you have lab work done today you will be contacted with your lab results within the next 2 weeks.  If you have not heard from us then please contact us. The fastest way to get your results is to register for My Chart. ° ° °IF you received an x-ray today, you will receive an invoice from [facility]. Please contact [facility] at [phone] with questions or concerns regarding your invoice.  ° °IF you received labwork today, you will receive an invoice from [facility]. Please contact [facility] at [phone] with questions or concerns regarding your invoice.  ° °Our billing staff will not be able to assist you with questions regarding bills from these companies. ° °You will be contacted with the lab results as soon as they are available. The fastest way to get your results is to activate your My Chart account. Instructions are located on the last page of this paperwork. If you have not heard from us regarding the results in 2 weeks, please contact this office. °  ° ° ° °

## 2019-11-19 NOTE — Progress Notes (Signed)
Established Patient Office Visit  Subjective:  Patient ID: Sue Baker, female    DOB: 11-29-1955  Age: 64 y.o. MRN: [id number]  CC:  Chief Complaint  Patient presents with  . chronic back and knee pain    med refill Tramadol    . Chronic Conditions    med refills   . Diabetes    HPI Sue Baker presents for  She reports that she is not exercising or dieting but is losing weight She is a type 2 diabetic She had an abnormal t4 with lab check last year.   Wt Readings from Last 3 Encounters:  01/19/20 203 lb 6.4 oz (92.3 kg)  01/14/20 203 lb 3.2 oz (92.2 kg)  12/21/19 204 lb 12.8 oz (92.9 kg)    Diabetes Mellitus: Patient presents for follow up of diabetes. Symptoms: hyperglycemia and polyuria. Symptoms have stabilized. Patient denies foot ulcerations, hypoglycemia , nausea, paresthesia of the feet and visual disturbances.  Evaluation to date has been included: hemoglobin A1C.   Lab Results  Component Value Date   HGBA1C 8.9 (A) 12/21/2019   Hypothyroidism: Patient presents for evaluation of thyroid function. Symptoms consist of weight loss. Symptoms have present for 6 months. The symptoms are mild.   Previous thyroid studies include TSH. The hypothyroidism is due to hypothyroidism.  Lab Results  Component Value Date   TSH 0.021 (L) 11/19/2019   She takes it daily first thing in the morning before she even gets out of bed every day. She misses her other meds at least once weekly.   Hypertension: Patient here for follow-up of elevated blood pressure. She is exercising and is adherent to low salt diet.  Blood pressure is well controlled at home. Cardiac symptoms none. Patient denies chest pressure/discomfort, dyspnea, fatigue and irregular heart beat.  Cardiovascular risk factors: diabetes mellitus and sedentary lifestyle. Use of agents associated with hypertension: thyroid hormones. History of target organ damage: none. BP Readings from Last 3 Encounters:  01/14/20 132/64    12/21/19 122/78  11/19/19 110/60    Past Medical History:  Diagnosis Date  . Allergy   . Arthritis   . Cataract   . Diabetes mellitus without complication ([place])   . GERD (gastroesophageal reflux disease)   . Hyperlipidemia   . Hypertension   . Sleep apnea   . Thyroid disease     Past Surgical History:  Procedure Laterality Date  . BREAST SURGERY    . CARPAL TUNNEL RELEASE    . EYE SURGERY    . KNEE SURGERY     left  . TUBAL LIGATION      Family History  Problem Relation Age of Onset  . Healthy Mother   . Heart disease Father   . Peripheral Artery Disease Father   . Hypertension Father   . Thyroid disease Sister   . Cancer Sister   . Hypertension Maternal Grandfather   . Heart disease Maternal Grandfather   . Diabetes Maternal Grandfather     Social History   Socioeconomic History  . Marital status: Single    Spouse name: Not on file  . Number of children: Not on file  . Years of education: 89  . Highest education level: Some college, no degree  Occupational History    Employer: [employer]  Tobacco Use  . Smoking status: Former Smoker    Quit date: 09/30/1999    Years since quitting: 20.3  . Smokeless tobacco: Never Used  Substance and Sexual Activity  .  Alcohol use: Yes    Comment: rarely  . Drug use: No  . Sexual activity: Not Currently  Other Topics Concern  . Not on file  Social History Narrative   Lives in a one story home.  Her daughter and grandson live with her.  Works at [employer]. 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  Education: some college.    Social Determinants of Health   Financial Resource Strain:   . Difficulty of Paying Living Expenses:   Food Insecurity:   . Worried About Charity fundraiser in the Last Year:   . Arboriculturist in the Last Year:   Transportation Needs:   . Film/video editor (Medical):   Marland Kitchen Lack of Transportation (Non-Medical):   Physical Activity:   . Days of Exercise per Week:    . Minutes of Exercise per Session:   Stress:   . Feeling of Stress :   Social Connections:   . Frequency of Communication with Friends and Family:   . Frequency of Social Gatherings with Friends and Family:   . Attends Religious Services:   . Active Member of Clubs or Organizations:   . Attends Archivist Meetings:   Marland Kitchen Marital Status:   Intimate Partner Violence:   . Fear of Current or Ex-Partner:   . Emotionally Abused:   Marland Kitchen Physically Abused:   . Sexually Abused:     Outpatient Medications Prior to Visit  Medication Sig Dispense Refill  . aspirin 81 MG tablet Take 81 mg by mouth daily.    Marland Kitchen escitalopram (LEXAPRO) 10 MG tablet TAKE 1 TABLET BY MOUTH DAILY 90 tablet 3  . fenofibrate (TRICOR) 145 MG tablet Take 1 tablet (145 mg total) by mouth daily. 90 tablet 1  . levothyroxine (SYNTHROID) 175 MCG tablet TAKE 1 TABLET BY MOUTH DAILY BEFORE BREAKFAST 30 tablet 0  . meloxicam (MOBIC) 15 MG tablet TAKE 1 TABLET BY MOUTH EVERY DAY AS NEEDED FOR PAIN 90 tablet 0  . olmesartan (BENICAR) 20 MG tablet TAKE 1 TABLET BY MOUTH EVERY DAY 30 tablet 0  . rosuvastatin (CRESTOR) 40 MG tablet Take 1 tablet (40 mg total) by mouth daily. 90 tablet 1  . Saxagliptin-Metformin (KOMBIGLYZE XR) 2.01-999 MG TB24 Take 2 tablets by mouth daily. 180 tablet 1  . traMADol (ULTRAM) 50 MG tablet Take 1-2 tablets (50-100 mg total) by mouth every 8 (eight) hours as needed. **Needs office visit for any additional refills.** 30 tablet 0  . cyclobenzaprine (FLEXERIL) 10 MG tablet Take 0.5-1 tablets (5-10 mg total) by mouth 3 (three) times daily as needed for muscle spasms. **Needs office visit for any additional refills** 30 tablet 0  . HYDROcodone-acetaminophen (NORCO/VICODIN) 5-325 MG tablet Take 1 tablet by mouth every 6 (six) hours as needed. 12 tablet 0   No facility-administered medications prior to visit.    No Known Allergies  ROS Review of Systems    Objective:    Physical Exam  BP 110/60    Pulse 64   Temp 98 F (36.7 C) (Temporal)   Ht 5' 4"  (1.626 m)   Wt 209 lb 12.8 oz (95.2 kg)   SpO2 95%   BMI 36.01 kg/m  Wt Readings from Last 3 Encounters:  01/19/20 203 lb 6.4 oz (92.3 kg)  01/14/20 203 lb 3.2 oz (92.2 kg)  12/21/19 204 lb 12.8 oz (92.9 kg)   Physical Exam  Constitutional: Oriented to person, place, and time. Appears well-developed and well-nourished.  HENT:  Head: Normocephalic and atraumatic.  Eyes: Conjunctivae and EOM are normal.  Cardiovascular: Normal rate, regular rhythm, normal heart sounds and intact distal pulses.  No murmur heard. Pulmonary/Chest: Effort normal and breath sounds normal. No stridor. No respiratory distress. Has no wheezes.  Neurological: Is alert and oriented to person, place, and time.  Skin: Skin is warm. Capillary refill takes less than 2 seconds.  Psychiatric: Has a normal mood and affect. Behavior is normal. Judgment and thought content normal.    Health Maintenance Due  Topic Date Due  . PNEUMOCOCCAL POLYSACCHARIDE VACCINE AGE 42-64 HIGH RISK  Never done  . COVID-19 Vaccine (1) Never done  . MAMMOGRAM  Never done  . PAP SMEAR-Modifier  07/24/2018  . COLONOSCOPY  09/24/2019    There are no preventive care reminders to display for this patient.  Lab Results  Component Value Date   TSH 0.021 (L) 11/19/2019   Lab Results  Component Value Date   WBC 5.1 11/19/2019   HGB 12.9 11/19/2019   HCT 39.2 11/19/2019   MCV 97 11/19/2019   PLT 190 11/19/2019   Lab Results  Component Value Date   NA 136 11/19/2019   K 4.4 11/19/2019   CO2 24 11/19/2019   GLUCOSE 290 (H) 11/19/2019   BUN 18 11/19/2019   CREATININE 0.70 11/19/2019   BILITOT 0.6 11/19/2019   ALKPHOS 125 (H) 11/19/2019   AST 43 (H) 11/19/2019   ALT 37 (H) 11/19/2019   PROT 6.8 11/19/2019   ALBUMIN 4.3 11/19/2019   CALCIUM 9.7 11/19/2019   Lab Results  Component Value Date   CHOL 149 11/19/2019   Lab Results  Component Value Date   HDL 35 (L)  11/19/2019   Lab Results  Component Value Date   LDLCALC 74 11/19/2019   Lab Results  Component Value Date   TRIG 246 (H) 11/19/2019   Lab Results  Component Value Date   CHOLHDL 4.3 11/19/2019   Lab Results  Component Value Date   HGBA1C 8.9 (A) 12/21/2019      Assessment & Plan:   Problem List Items Addressed This Visit      Endocrine   Diabetes mellitus ([place]) - Primary-  Uncontrolled Discussed standards of care   Relevant Medications   Saxagliptin-Metformin (KOMBIGLYZE XR) 2.01-999 MG TB24   rosuvastatin (CRESTOR) 40 MG tablet   Other Relevant Orders   HM Diabetes Foot Exam (Completed)   TSH + free T4 (Completed)   POCT glycosylated hemoglobin (Hb A1C) (Completed)   CMP14+EGFR (Completed)   Lipid panel (Completed)   Hypothyroid -  Will assess today, with her weight issues this could be a factor    Other Visit Diagnoses    Encounter for screening mammogram for malignant neoplasm of breast       Relevant Orders   MM Digital Screening   Unintended weight loss    -  Will screen for anemia   Relevant Orders   CBC with Differential/Platelet (Completed)   Uncontrolled type 2 diabetes mellitus with hyperglycemia ([facility])       Relevant Medications   Saxagliptin-Metformin (KOMBIGLYZE XR) 2.01-999 MG TB24   rosuvastatin (CRESTOR) 40 MG tablet   Other Relevant Orders   Ambulatory referral to diabetic education      Meds ordered this encounter  Medications  . DISCONTD: Dulaglutide (TRULICITY) 0.76 AU/6.3FH SOPN    Sig: Inject 0.75 mg into the skin once a week.    Dispense:  6 pen    Refill:  0  . DISCONTD: olmesartan (BENICAR) 20 MG tablet    Sig: Take 1 tablet (20 mg total) by mouth daily.    Dispense:  90 tablet    Refill:  0  . Saxagliptin-Metformin (KOMBIGLYZE XR) 2.01-999 MG TB24    Sig: Take 2 tablets by mouth daily.    Dispense:  180 tablet    Refill:  1  . fenofibrate (TRICOR) 145 MG tablet    Sig: Take 1 tablet (145 mg total) by mouth daily.     Dispense:  90 tablet    Refill:  1  . rosuvastatin (CRESTOR) 40 MG tablet    Sig: Take 1 tablet (40 mg total) by mouth daily.    Dispense:  90 tablet    Refill:  1  . meloxicam (MOBIC) 15 MG tablet    Sig: TAKE 1 TABLET BY MOUTH EVERY DAY AS NEEDED FOR PAIN    Dispense:  90 tablet    Refill:  0  . traMADol (ULTRAM) 50 MG tablet    Sig: Take 1-2 tablets (50-100 mg total) by mouth every 8 (eight) hours as needed.    Dispense:  30 tablet    Refill:  0    Follow-up: Return in about 4 weeks (around 12/17/2019) for diabetes follow up .    Forrest Moron, MD

## 2019-11-20 LAB — CMP14+EGFR
ALT: 37 IU/L — ABNORMAL HIGH (ref 0–32)
AST: 43 IU/L — ABNORMAL HIGH (ref 0–40)
Albumin/Globulin Ratio: 1.7 (ref 1.2–2.2)
Albumin: 4.3 g/dL (ref 3.8–4.8)
Alkaline Phosphatase: 125 IU/L — ABNORMAL HIGH (ref 39–117)
BUN/Creatinine Ratio: 26 (ref 12–28)
BUN: 18 mg/dL (ref 8–27)
Bilirubin Total: 0.6 mg/dL (ref 0.0–1.2)
CO2: 24 mmol/L (ref 20–29)
Calcium: 9.7 mg/dL (ref 8.7–10.3)
Chloride: 100 mmol/L (ref 96–106)
Creatinine, Ser: 0.7 mg/dL (ref 0.57–1.00)
GFR calc Af Amer: 107 mL/min/{1.73_m2} (ref >59)
GFR calc non Af Amer: 93 mL/min/{1.73_m2} (ref >59)
Globulin, Total: 2.5 g/dL (ref 1.5–4.5)
Glucose: 290 mg/dL — ABNORMAL HIGH (ref 65–99)
Potassium: 4.4 mmol/L (ref 3.5–5.2)
Sodium: 136 mmol/L (ref 134–144)
Total Protein: 6.8 g/dL (ref 6.0–8.5)

## 2019-11-20 LAB — LIPID PANEL
Chol/HDL Ratio: 4.3 ratio (ref 0.0–4.4)
Cholesterol, Total: 149 mg/dL (ref 100–199)
HDL: 35 mg/dL — ABNORMAL LOW (ref >39)
LDL Chol Calc (NIH): 74 mg/dL (ref 0–99)
Triglycerides: 246 mg/dL — ABNORMAL HIGH (ref 0–149)
VLDL Cholesterol Cal: 40 mg/dL (ref 5–40)

## 2019-11-20 LAB — CBC WITH DIFFERENTIAL/PLATELET
Basophils Absolute: 0 10*3/uL (ref 0.0–0.2)
Basos: 1 %
EOS (ABSOLUTE): 0.1 10*3/uL (ref 0.0–0.4)
Eos: 1 %
Hematocrit: 39.2 % (ref 34.0–46.6)
Hemoglobin: 12.9 g/dL (ref 11.1–15.9)
Immature Grans (Abs): 0 10*3/uL (ref 0.0–0.1)
Immature Granulocytes: 0 %
Lymphocytes Absolute: 2.4 10*3/uL (ref 0.7–3.1)
Lymphs: 48 %
MCH: 31.8 pg (ref 26.6–33.0)
MCHC: 32.9 g/dL (ref 31.5–35.7)
MCV: 97 fL (ref 79–97)
Monocytes Absolute: 0.4 10*3/uL (ref 0.1–0.9)
Monocytes: 8 %
Neutrophils Absolute: 2.1 10*3/uL (ref 1.4–7.0)
Neutrophils: 42 %
Platelets: 190 10*3/uL (ref 150–450)
RBC: 4.06 x10E6/uL (ref 3.77–5.28)
RDW: 11.6 % — ABNORMAL LOW (ref 11.7–15.4)
WBC: 5.1 10*3/uL (ref 3.4–10.8)

## 2019-11-20 LAB — TSH+FREE T4
Free T4: 2.23 ng/dL — ABNORMAL HIGH (ref 0.82–1.77)
TSH: 0.021 u[IU]/mL — ABNORMAL LOW (ref 0.450–4.500)

## 2019-11-23 ENCOUNTER — Other Ambulatory Visit: Payer: Self-pay | Admitting: Family Medicine

## 2019-11-23 DIAGNOSIS — E039 Hypothyroidism, unspecified: Secondary | ICD-10-CM

## 2019-11-23 MED ORDER — LEVOTHYROXINE SODIUM 150 MCG PO TABS: ORAL_TABLET | ORAL | 1 refills | Status: DC

## 2019-11-25 ENCOUNTER — Other Ambulatory Visit: Payer: Self-pay | Admitting: Family Medicine

## 2019-11-25 DIAGNOSIS — E039 Hypothyroidism, unspecified: Secondary | ICD-10-CM

## 2019-12-14 ENCOUNTER — Other Ambulatory Visit: Payer: Self-pay | Admitting: Family Medicine

## 2019-12-14 DIAGNOSIS — E1165 Type 2 diabetes mellitus with hyperglycemia: Secondary | ICD-10-CM

## 2019-12-14 NOTE — Telephone Encounter (Signed)
Requested Prescriptions  Pending Prescriptions Disp Refills  . TRULICITY [id number] SOPN [Pharmacy Med Name: TRULICITY [id number] ML PEN] 6 pen 1    Sig: INJECT 0.75MG  UNDER THE SKIN ONCE WEEKLY     Endocrinology:  Diabetes - GLP-1 Receptor Agonists Failed - 12/14/2019  1:19 AM      Failed - HBA1C is between 0 and 7.9 and within 180 days    Hemoglobin A1C  Date Value Ref Range Status  11/19/2019 11.2 (A) 4.0 - 5.6 % Final   Hgb A1c MFr Bld  Date Value Ref Range Status  02/27/2017 7.1 (H) 4.8 - 5.6 % Final    Comment:             Pre-diabetes: 5.7 - 6.4          Diabetes: >6.4          Glycemic control for adults with diabetes: <7.0          Passed - Valid encounter within last 6 months    Recent Outpatient Visits          3 weeks ago Type 2 diabetes mellitus with hyperglycemia, without long-term current use of insulin ([place])   Primary Care at [facility], Zoe A, MD   1 year ago Type 2 diabetes mellitus with hyperglycemia, without long-term current use of insulin [facility])   Primary Care at [facility], Ines Bloomer, MD   1 year ago Acquired hypothyroidism   Primary Care at [facility], [place] A, MD   1 year ago Type 2 diabetes mellitus with complication, without long-term current use of insulin ([place])   Primary Care at [facility], [place] A, MD   2 years ago Type 2 diabetes mellitus with complication, without long-term current use of insulin [facility])   [facility] at [facility], Laurey Arrow, MD      Future Appointments            In 1 week Forrest Moron, MD Primary Care at [place], [facility]

## 2019-12-16 ENCOUNTER — Encounter: Payer: 59 | Attending: Family Medicine | Admitting: Registered"

## 2019-12-16 ENCOUNTER — Other Ambulatory Visit: Payer: Self-pay

## 2019-12-16 ENCOUNTER — Encounter: Payer: Self-pay | Admitting: Registered"

## 2019-12-16 DIAGNOSIS — E1165 Type 2 diabetes mellitus with hyperglycemia: Secondary | ICD-10-CM | POA: Insufficient documentation

## 2019-12-16 NOTE — Progress Notes (Signed)
Diabetes Self-Management Education  Visit Type: First/Initial  Appt. Start Time: 11:07  Appt. End Time: 12:25   12/16/2019  [name], identified by name and date of birth, is a 64 y.o. female with a diagnosis of Diabetes: Type 2.   Dietetic Intern, Billey Gosling, provided nutrition counseling for this appointment under supervision and assistance of dietitian.   ASSESSMENT  Weight 207 lb 4.8 oz (94 kg). Body mass index is 35.58 kg/m.  Pt expects more information on food. Pt thinks carbs are bad, and that she shouldn't eat them. Pt reports reading food labels and trying to choose foods low in sugar.  Pt reports not checking blood glucose for past 2 years. Reports having a meter and knowing how to use. Pt reports not liking checking her blood sugar, stopped because she did not see the big benefit of it. Reports eating every 3-4 hours Pt considers her health "fair" due to having diabetes, elevated blood lipids, and elevated blood pressure. Pt reports having knee pain. Pt reports working 12 hour shifts. Rotates between day and night shifts.  Pt reports she doesn't like to drink water much. Does like plain water.  Pt reports having a difficult time keeping up consistent meals on her off days.  Diabetes Self-Management Education - 12/16/19 1115      Visit Information   Visit Type  First/Initial      Initial Visit   Diabetes Type  Type 2    Are you currently following a meal plan?  No    Are you taking your medications as prescribed?  Yes    Date Diagnosed  2010      Health Coping   How would you rate your overall health?  Fair      Psychosocial Assessment   Patient Belief/Attitude about Diabetes  Motivated to manage diabetes    Self-care barriers  None    Self-management support  Family    Patient Concerns  Nutrition/Meal planning    Special Needs  None    Learning Readiness  Ready    How often do you need to have someone help you when you read instructions, pamphlets,  or other written materials from your doctor or pharmacy?  1 - Never    What is the last grade level you completed in school?  high school      Pre-Education Assessment   Patient understands the diabetes disease and treatment process.  Demonstrates understanding / competency    Patient understands incorporating nutritional management into lifestyle.  Needs Instruction    Patient undertands incorporating physical activity into lifestyle.  Needs Instruction    Patient understands using medications safely.  Needs Review    Patient understands monitoring blood glucose, interpreting and using results  Needs Review    Patient understands prevention, detection, and treatment of acute complications.  Demonstrates understanding / competency    Patient understands prevention, detection, and treatment of chronic complications.  Demonstrates understanding / competency    Patient understands how to develop strategies to address psychosocial issues.  Needs Review    Patient understands how to develop strategies to promote health/change behavior.  Needs Review      Complications   Last HgB A1C per patient/outside source  11.2 %    How often do you check your blood sugar?  0 times/day (not testing)    Fasting Blood glucose range (mg/dL)  --   Not checking   Postprandial Blood glucose range (mg/dL)  --   Not checking  Number of hypoglycemic episodes per month  0    Number of hyperglycemic episodes per week  1    Can you tell when your blood sugar is high?  Yes    Have you had a dilated eye exam in the past 12 months?  Yes    Have you had a dental exam in the past 12 months?  Yes    Are you checking your feet?  Yes    How many days per week are you checking your feet?  5      Dietary Intake   Breakfast  [employer] sausage biscuit, green tea or flavored water    Snack (morning)  Fruit and cheese    Lunch  Ham sandwich or salad or nachos    Snack (afternoon)  Fruit, and pretzels    Dinner  [employer]  chicken, diet soda    Beverage(s)  green tea or flavored water. Pt does not like plain water.      Exercise   Exercise Type  ADL's    How many days per week to you exercise?  0    How many minutes per day do you exercise?  0    Total minutes per week of exercise  0      Patient Education   Previous Diabetes Education  No    Disease state   Definition of diabetes, type 1 and 2, and the diagnosis of diabetes;Factors that contribute to the development of diabetes    Nutrition management   Role of diet in the treatment of diabetes and the relationship between the three main macronutrients and blood glucose level;Food label reading, portion sizes and measuring food.;Carbohydrate counting;Reviewed blood glucose goals for pre and post meals and how to evaluate the patients' food intake on their blood glucose level.    Physical activity and exercise   Helped patient identify appropriate exercises in relation to his/her diabetes, diabetes complications and other health issue.    Medications  Reviewed patients medication for diabetes, action, purpose, timing of dose and side effects.    Monitoring  Purpose and frequency of SMBG.;Daily foot exams;Yearly dilated eye exam    Acute complications  Taught treatment of hypoglycemia - the 15 rule.;Discussed and identified patients' treatment of hyperglycemia.    Chronic complications  Retinopathy and reason for yearly dilated eye exams;Assessed and discussed foot care and prevention of foot problems    Psychosocial adjustment  Worked with patient to identify barriers to care and solutions;Helped patient identify a support system for diabetes management    Personal strategies to promote health  Lifestyle issues that need to be addressed for better diabetes care      Individualized Goals (developed by patient)   Nutrition  Follow meal plan discussed;General guidelines for healthy choices and portions discussed    Physical Activity  Not Applicable    Medications   take my medication as prescribed    Monitoring   test my blood glucose as discussed    Health Coping  Not Applicable      Post-Education Assessment   Patient understands the diabetes disease and treatment process.  Demonstrates understanding / competency    Patient understands incorporating nutritional management into lifestyle.  Demonstrates understanding / competency    Patient undertands incorporating physical activity into lifestyle.  Needs Review    Patient understands using medications safely.  Demonstrates understanding / competency    Patient understands monitoring blood glucose, interpreting and using results  Demonstrates understanding / competency  Patient understands prevention, detection, and treatment of acute complications.  Demonstrates understanding / competency    Patient understands prevention, detection, and treatment of chronic complications.  Demonstrates understanding / competency    Patient understands how to develop strategies to address psychosocial issues.  Demonstrates understanding / competency    Patient understands how to develop strategies to promote health/change behavior.  Demonstrates understanding / competency      Outcomes   Expected Outcomes  Demonstrated interest in learning. Expect positive outcomes    Future DMSE  4-6 wks    Program Status  Not Completed       Individualized Plan for Diabetes Self-Management Training:   Learning Objective:  Patient will have a greater understanding of diabetes self-management. Patient education plan is to attend individual and/or group sessions per assessed needs and concerns.   Plan: Provided DSME. Will cover physical activity at follow up.  Instructions/Goals:  Check your blood sugar daily! Once fasting and once after a meal!  Ranges: 80-130 fasting. <180 after a meal.  Try to work in a glass of water daily. Bring a bottle of water and alternate sips with your other beverages.  Try to eat 3 carb  choices per meal, and 1-2 per snack!  Work to meet the proportions of the healthy plate model (1/4 carbs, 1/4 protein, 1/2 non starchy vegetables)!  Keep up eating every 3-4 hours!  Work to make one meal more like your work days on your days off.  Patient Instructions  Check your blood sugar daily! Once fasting and once after a meal!  Ranges: 80-130 fasting. <180 after a meal.  Try to work in a glass of water daily. Bring a bottle of water and alternate sips with your other beverages.  Try to eat 3 carb choices per meal, and 1-2 per snack!  Work to meet the proportions of the healthy plate model (1/4 carbs, 1/4 protein, 1/2 non starchy vegetables)!  Keep up eating every 3-4 hours!  Work to make one meal more like your work days on your days off.    Expected Outcomes:  Demonstrated interest in learning. Expect positive outcomes  Education material provided: ADA - How to Thrive: A Guide for Your Journey with Diabetes, Meal plan card and My Plate  If problems or questions, patient to contact team via:  Phone and Email  Future DSME appointment: 4-6 wks

## 2019-12-16 NOTE — Patient Instructions (Addendum)
Check your blood sugar daily! Once fasting and once after a meal!  Ranges: 80-130 fasting. <180 after a meal.  Try to work in a glass of water daily. Bring a bottle of water and alternate sips with your other beverages.  Try to eat 3 carb choices per meal, and 1-2 per snack!  Work to meet the proportions of the healthy plate model (1/4 carbs, 1/4 protein, 1/2 non starchy vegetables)!  Keep up eating every 3-4 hours!  Work to make one meal more like your work days on your days off.

## 2019-12-21 ENCOUNTER — Other Ambulatory Visit: Payer: Self-pay

## 2019-12-21 ENCOUNTER — Encounter: Payer: Self-pay | Admitting: Family Medicine

## 2019-12-21 ENCOUNTER — Ambulatory Visit: Payer: 59 | Admitting: Family Medicine

## 2019-12-21 VITALS — BP 122/78 | HR 65 | Temp 97.8°F | Ht 64.0 in | Wt 204.8 lb

## 2019-12-21 DIAGNOSIS — Z1211 Encounter for screening for malignant neoplasm of colon: Secondary | ICD-10-CM | POA: Diagnosis not present

## 2019-12-21 DIAGNOSIS — E1165 Type 2 diabetes mellitus with hyperglycemia: Secondary | ICD-10-CM | POA: Diagnosis not present

## 2019-12-21 DIAGNOSIS — Z1231 Encounter for screening mammogram for malignant neoplasm of breast: Secondary | ICD-10-CM

## 2019-12-21 LAB — POCT GLYCOSYLATED HEMOGLOBIN (HGB A1C): Hemoglobin A1C: 8.9 % — AB (ref 4.0–5.6)

## 2019-12-21 MED ORDER — BLOOD GLUCOSE MONITOR KIT: PACK | 0 refills | Status: AC

## 2019-12-21 NOTE — Patient Instructions (Signed)
° ° ° °  If you have lab work done today you will be contacted with your lab results within the next 2 weeks.  If you have not heard from us then please contact us. The fastest way to get your results is to register for My Chart. ° ° °IF you received an x-ray today, you will receive an invoice from [facility]. Please contact [facility] at [phone] with questions or concerns regarding your invoice.  ° °IF you received labwork today, you will receive an invoice from [facility]. Please contact [facility] at [phone] with questions or concerns regarding your invoice.  ° °Our billing staff will not be able to assist you with questions regarding bills from these companies. ° °You will be contacted with the lab results as soon as they are available. The fastest way to get your results is to activate your My Chart account. Instructions are located on the last page of this paperwork. If you have not heard from us regarding the results in 2 weeks, please contact this office. °  ° ° ° °

## 2019-12-21 NOTE — Progress Notes (Signed)
Established Patient Office Visit  Subjective:  Patient ID: Sue Baker, female    DOB: Nov 07, 1955  Age: 64 y.o. MRN: [id number]  CC:  Chief Complaint  Patient presents with  . Follow-up    x1 month on her Diabetes    HPI Sue Baker presents for   Diabetes Mellitus: Patient presents for follow up of diabetes. Symptoms: none Symptoms have been well-controlled. Patient denies hyperglycemia, hypoglycemia , increase appetite, nausea, paresthesia of the feet and polydipsia.  Evaluation to date has been included: hemoglobin A1C.  Home sugars: patient does not check sugars.  Lab Results  Component Value Date   HGBA1C 11.2 (A) 11/19/2019   Lab Results  Component Value Date   HGBA1C 8.9 (A) 12/21/2019   Colon Cancer Screening She had a colonoscopy 9 years ago. She denies blood in his stool, unexpected weight loss or pain with defecation No rectal itching She does not smoke She does not have a family history of colon cancer    Past Medical History:  Diagnosis Date  . Allergy   . Arthritis   . Cataract   . Diabetes mellitus without complication ([place])   . GERD (gastroesophageal reflux disease)   . Hyperlipidemia   . Hypertension   . Sleep apnea   . Thyroid disease     Past Surgical History:  Procedure Laterality Date  . BREAST SURGERY    . CARPAL TUNNEL RELEASE    . EYE SURGERY    . KNEE SURGERY     left  . TUBAL LIGATION      Family History  Problem Relation Age of Onset  . Healthy Mother   . Heart disease Father   . Peripheral Artery Disease Father   . Hypertension Father   . Thyroid disease Sister   . Cancer Sister   . Hypertension Maternal Grandfather   . Heart disease Maternal Grandfather   . Diabetes Maternal Grandfather     Social History   Socioeconomic History  . Marital status: Single    Spouse name: Not on file  . Number of children: Not on file  . Years of education: 75  . Highest education level: Some college, no degree  Occupational  History    Employer: [employer]  Tobacco Use  . Smoking status: Former Smoker    Quit date: 09/30/1999    Years since quitting: 20.2  . Smokeless tobacco: Never Used  Substance and Sexual Activity  . Alcohol use: Yes    Comment: rarely  . Drug use: No  . Sexual activity: Not Currently  Other Topics Concern  . Not on file  Social History Narrative   Lives in a one story home.  Her daughter and grandson live with her.  Works at [employer]. 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  Education: some college.    Social Determinants of Health   Financial Resource Strain:   . Difficulty of Paying Living Expenses:   Food Insecurity:   . Worried About Charity fundraiser in the Last Year:   . Arboriculturist in the Last Year:   Transportation Needs:   . Film/video editor (Medical):   Marland Kitchen Lack of Transportation (Non-Medical):   Physical Activity:   . Days of Exercise per Week:   . Minutes of Exercise per Session:   Stress:   . Feeling of Stress :   Social Connections:   . Frequency of Communication with Friends and Family:   .  Frequency of Social Gatherings with Friends and Family:   . Attends Religious Services:   . Active Member of Clubs or Organizations:   . Attends Archivist Meetings:   Marland Kitchen Marital Status:   Intimate Partner Violence:   . Fear of Current or Ex-Partner:   . Emotionally Abused:   Marland Kitchen Physically Abused:   . Sexually Abused:     Outpatient Medications Prior to Visit  Medication Sig Dispense Refill  . aspirin 81 MG tablet Take 81 mg by mouth daily.    . cyclobenzaprine (FLEXERIL) 10 MG tablet Take 10 mg by mouth 3 (three) times daily as needed for muscle spasms.    Marland Kitchen escitalopram (LEXAPRO) 10 MG tablet TAKE 1 TABLET BY MOUTH DAILY 90 tablet 3  . fenofibrate (TRICOR) 145 MG tablet Take 1 tablet (145 mg total) by mouth daily. 90 tablet 1  . levothyroxine (SYNTHROID) 150 MCG tablet TAKE 1 TABLET BY MOUTH DAILY BEFORE BREAKFAST 90 tablet  1  . meloxicam (MOBIC) 15 MG tablet TAKE 1 TABLET BY MOUTH EVERY DAY AS NEEDED FOR PAIN 90 tablet 0  . olmesartan (BENICAR) 20 MG tablet Take 1 tablet (20 mg total) by mouth daily. 90 tablet 0  . rosuvastatin (CRESTOR) 40 MG tablet Take 1 tablet (40 mg total) by mouth daily. 90 tablet 1  . Saxagliptin-Metformin (KOMBIGLYZE XR) 2.01-999 MG TB24 Take 2 tablets by mouth daily. 180 tablet 1  . traMADol (ULTRAM) 50 MG tablet Take 1-2 tablets (50-100 mg total) by mouth every 8 (eight) hours as needed. 30 tablet 0  . TRULICITY [id number] 0000000 SOPN INJECT 0.75MG  UNDER THE SKIN ONCE WEEKLY 6 pen 1   No facility-administered medications prior to visit.    No Known Allergies  ROS Review of Systems Review of Systems  Constitutional: Negative for activity change, appetite change, chills and fever.  HENT: Negative for congestion, nosebleeds, trouble swallowing and voice change.   Respiratory: Negative for cough, shortness of breath and wheezing.   Gastrointestinal: Negative for diarrhea, nausea and vomiting.  Genitourinary: Negative for difficulty urinating, dysuria, flank pain and hematuria.  Musculoskeletal: Negative for back pain, joint swelling and neck pain.  Neurological: Negative for dizziness, speech difficulty, light-headedness and numbness.  See HPI. All other review of systems negative.     Objective:    Physical Exam  BP (!) 147/75 (BP Location: Right Arm, Patient Position: Sitting, Cuff Size: Normal)   Pulse 65   Temp 97.8 F (36.6 C) (Temporal)   Ht 5\' 4"  (1.626 m)   Wt 204 lb 12.8 oz (92.9 kg)   SpO2 97%   BMI 35.15 kg/m  Wt Readings from Last 3 Encounters:  12/21/19 204 lb 12.8 oz (92.9 kg)  12/16/19 207 lb 4.8 oz (94 kg)  11/19/19 209 lb 12.8 oz (95.2 kg)   Physical Exam  Constitutional: Oriented to person, place, and time. Appears well-developed and well-nourished.  HENT:  Head: Normocephalic and atraumatic.  Eyes: Conjunctivae and EOM are normal.  Cardiovascular:  Normal rate, regular rhythm, normal heart sounds and intact distal pulses.  No murmur heard. Pulmonary/Chest: Effort normal and breath sounds normal. No stridor. No respiratory distress. Has no wheezes.  Neurological: Is alert and oriented to person, place, and time.  Skin: Skin is warm. Capillary refill takes less than 2 seconds.  Psychiatric: Has a normal mood and affect. Behavior is normal. Judgment and thought content normal.    Health Maintenance Due  Topic Date Due  . PNEUMOCOCCAL POLYSACCHARIDE VACCINE AGE  2-64 HIGH RISK  Never done  . MAMMOGRAM  Never done  . PAP SMEAR-Modifier  07/24/2018  . INFLUENZA VACCINE  04/24/2019  . COLONOSCOPY  09/24/2019    There are no preventive care reminders to display for this patient.  Lab Results  Component Value Date   TSH 0.021 (L) 11/19/2019   Lab Results  Component Value Date   WBC 5.1 11/19/2019   HGB 12.9 11/19/2019   HCT 39.2 11/19/2019   MCV 97 11/19/2019   PLT 190 11/19/2019   Lab Results  Component Value Date   NA 136 11/19/2019   K 4.4 11/19/2019   CO2 24 11/19/2019   GLUCOSE 290 (H) 11/19/2019   BUN 18 11/19/2019   CREATININE 0.70 11/19/2019   BILITOT 0.6 11/19/2019   ALKPHOS 125 (H) 11/19/2019   AST 43 (H) 11/19/2019   ALT 37 (H) 11/19/2019   PROT 6.8 11/19/2019   ALBUMIN 4.3 11/19/2019   CALCIUM 9.7 11/19/2019   Lab Results  Component Value Date   CHOL 149 11/19/2019   Lab Results  Component Value Date   HDL 35 (L) 11/19/2019   Lab Results  Component Value Date   LDLCALC 74 11/19/2019   Lab Results  Component Value Date   TRIG 246 (H) 11/19/2019   Lab Results  Component Value Date   CHOLHDL 4.3 11/19/2019   Lab Results  Component Value Date   HGBA1C 11.2 (A) 11/19/2019      Assessment & Plan:   Problem List Items Addressed This Visit      Endocrine   Diabetes mellitus ([place])   Relevant Orders   POCT glycosylated hemoglobin (Hb A1C)   Microalbumin, urine    Other Visit Diagnoses      Screening for colon cancer    -  Primary   Relevant Orders   Ambulatory referral to Gastroenterology   Encounter for screening mammogram for malignant neoplasm of breast          No orders of the defined types were placed in this encounter.   Follow-up: No follow-ups on file.    Forrest Moron, MD

## 2019-12-22 LAB — MICROALBUMIN, URINE: Microalbumin, Urine: 3.1 ug/mL

## 2020-01-04 ENCOUNTER — Telehealth: Payer: Self-pay | Admitting: Family Medicine

## 2020-01-04 ENCOUNTER — Encounter: Payer: Self-pay | Admitting: Nurse Practitioner

## 2020-01-04 NOTE — Telephone Encounter (Signed)
Left pt a vmtcb to resch 3 month f/u for medical condition. Dr. Nolon Rod pt seeing if pt is wanting to do a TOC with another provider or if pt would like to go with Dr. Nolon Rod.

## 2020-01-12 ENCOUNTER — Other Ambulatory Visit: Payer: Self-pay | Admitting: Family Medicine

## 2020-01-12 NOTE — Telephone Encounter (Signed)
Requested Prescriptions  Pending Prescriptions Disp Refills  . ONETOUCH VERIO test strip [employer] Med Name: ONE TOUCH VERIO TEST STRIP] 100 strip 5    Sig: USE AS DIRECTED     Endocrinology: Diabetes - Testing Supplies Passed - 01/12/2020  3:14 AM      Passed - Valid encounter within last 12 months    Recent Outpatient Visits          3 weeks ago Screening for colon cancer   [facility] at [facility], Zoe A, MD   1 month ago Type 2 diabetes mellitus with hyperglycemia, without long-term current use of insulin ([place])   Primary Care at [facility], Zoe A, MD   1 year ago Type 2 diabetes mellitus with hyperglycemia, without long-term current use of insulin [facility] [address])   Primary Care at [facility], Ines Bloomer, MD   1 year ago Acquired hypothyroidism   Primary Care at [facility], [place] A, MD   1 year ago Type 2 diabetes mellitus with complication, without long-term current use of insulin [facility])   Primary Care at [facility], Arlie Solomons, MD

## 2020-01-14 ENCOUNTER — Ambulatory Visit: Payer: 59 | Admitting: Nurse Practitioner

## 2020-01-14 ENCOUNTER — Encounter: Payer: Self-pay | Admitting: Nurse Practitioner

## 2020-01-14 ENCOUNTER — Other Ambulatory Visit: Payer: Self-pay | Admitting: Nurse Practitioner

## 2020-01-14 VITALS — BP 132/64 | HR 64 | Temp 98.3°F | Ht 64.0 in | Wt 203.2 lb

## 2020-01-14 DIAGNOSIS — R748 Abnormal levels of other serum enzymes: Secondary | ICD-10-CM

## 2020-01-14 DIAGNOSIS — Z1211 Encounter for screening for malignant neoplasm of colon: Secondary | ICD-10-CM | POA: Diagnosis not present

## 2020-01-14 DIAGNOSIS — K219 Gastro-esophageal reflux disease without esophagitis: Secondary | ICD-10-CM | POA: Diagnosis not present

## 2020-01-14 MED ORDER — NA SULFATE-K SULFATE-MG SULF 17.5-3.13-1.6 GM/177ML PO SOLN: 1.0000 | Freq: Once | ORAL | 0 refills | Status: AC

## 2020-01-14 MED ORDER — ESOMEPRAZOLE MAGNESIUM 20 MG PO CPDR: 20.0000 mg | DELAYED_RELEASE_CAPSULE | Freq: Every day | ORAL | Status: AC

## 2020-01-14 NOTE — Patient Instructions (Signed)
If you are age 62 or older, your body mass index should be between 23-30. Your Body mass index is 34.88 kg/m. If this is out of the aforementioned range listed, please consider follow up with your Primary Care Provider.  If you are age 71 or younger, your body mass index should be between 19-25. Your Body mass index is 34.88 kg/m. If this is out of the aformentioned range listed, please consider follow up with your Primary Care Provider.   You have been scheduled for a colonoscopy. Please follow written instructions given to you at your visit today.  Please pick up your prep supplies at the pharmacy within the next 1-3 days. If you use inhalers (even only as needed), please bring them with you on the day of your procedure.

## 2020-01-14 NOTE — Progress Notes (Signed)
ASSESSMENT / PLAN:   64 year old female with PMH significant for diabetes, GERD, hyperlipidemia, hypertension, sleep apnea, hypothyroidism  # Colon cancer screening --Patient thinks her last colonoscopy in [place] was 8 to 10 years ago.  She thinks it was normal but not sure --will schedule for screening colonoscopy. Patient will be scheduled for a colonoscopy. The risks and benefits of colonoscopy with possible polypectomy / biopsies were discussed and the patient agrees to proceed.   # GERD with heartburn --Asymptomatic with OTC Nexium every other day  # Abnormal liver tests, chronic --After patient left today I looked back at some of her labs.  Her liver enzymes are chronically elevated all the way back to 2014.  Suspect this is due to fatty liver disease documented on RUQ ultrasound in 2016. She has intentionally lost several pounds which can help with hepatic steatosis.  --HCV antibody was negative in 2016 --We will contact patient sometime next week to discuss the matter.  Will ask her to come by at her convenience for additional labs to rule other etiologies of chronic liver disease such as viral, autoimmune, genetic, metabolic causes.  Depending on the time of her colonoscopy she did have labs drawn on that day   HPI:     Chief Complaint: None   Sue Baker   is a 64 year old female referred by PCP for colon cancer screening.  Patient moved from [place] to [place] approximately 8 years ago for job-related reasons.  Her last colonoscopy was in [place] to 10 years ago and she thinks it was normal.  She has no family history of colon cancer.  No blood in stool.  She did have some loose stool after starting Trulicity.  She has lost 24 pounds over the last couple of months but that has been intentional.  She has cut out carbohydrates.  She has really been trying to work on lowering her hemoglobin A1c and she did lower it from 11 to 8.9.   Sue Baker has a longstanding  history of GERD with heartburn.  Her symptoms are controlled on QOD OTC Nexium.  Past Medical History:  Diagnosis Date  . Allergy   . Arthritis   . Cataract   . Diabetes mellitus without complication ([place])   . GERD (gastroesophageal reflux disease)   . Hyperlipidemia   . Hypertension   . Sleep apnea   . Thyroid disease      Past Surgical History:  Procedure Laterality Date  . BREAST SURGERY    . CARPAL TUNNEL RELEASE    . EYE SURGERY    . KNEE SURGERY     left  . TUBAL LIGATION     Family History  Problem Relation Age of Onset  . Healthy Mother   . Heart disease Father   . Peripheral Artery Disease Father   . Hypertension Father   . Thyroid disease Sister   . Cancer Sister   . Hypertension Maternal Grandfather   . Heart disease Maternal Grandfather   . Diabetes Maternal Grandfather    Social History   Tobacco Use  . Smoking status: Former Smoker    Quit date: 09/30/1999    Years since quitting: 20.3  . Smokeless tobacco: Never Used  Substance Use Topics  . Alcohol use: Yes    Comment: rarely  . Drug use: No   Current Outpatient Medications  Medication Sig Dispense Refill  . aspirin 81 MG tablet Take 81 mg by  mouth daily.    . blood glucose meter kit and supplies KIT Dispense based on patient and insurance preference. Use up to four times daily as directed. (FOR ICD-10 E11.9) 1 each 0  . cyclobenzaprine (FLEXERIL) 10 MG tablet Take 10 mg by mouth 3 (three) times daily as needed for muscle spasms.    Marland Kitchen escitalopram (LEXAPRO) 10 MG tablet TAKE 1 TABLET BY MOUTH DAILY 90 tablet 3  . fenofibrate (TRICOR) 145 MG tablet Take 1 tablet (145 mg total) by mouth daily. 90 tablet 1  . levothyroxine (SYNTHROID) 150 MCG tablet TAKE 1 TABLET BY MOUTH DAILY BEFORE BREAKFAST 90 tablet 1  . meloxicam (MOBIC) 15 MG tablet TAKE 1 TABLET BY MOUTH EVERY DAY AS NEEDED FOR PAIN 90 tablet 0  . olmesartan (BENICAR) 20 MG tablet Take 1 tablet (20 mg total) by mouth daily. 90 tablet 0  .  ONETOUCH VERIO test strip USE AS DIRECTED 100 strip 5  . rosuvastatin (CRESTOR) 40 MG tablet Take 1 tablet (40 mg total) by mouth daily. 90 tablet 1  . Saxagliptin-Metformin (KOMBIGLYZE XR) 2.01-999 MG TB24 Take 2 tablets by mouth daily. 180 tablet 1  . traMADol (ULTRAM) 50 MG tablet Take 1-2 tablets (50-100 mg total) by mouth every 8 (eight) hours as needed. 30 tablet 0  . TRULICITY 7.98 XQ/1.1HE SOPN INJECT 0.75MG UNDER THE SKIN ONCE WEEKLY 6 pen 1  . esomeprazole (NEXIUM 24HR) 20 MG capsule Take 1 capsule (20 mg total) by mouth daily at 12 noon.    . Na Sulfate-K Sulfate-Mg Sulf 17.5-3.13-1.6 GM/177ML SOLN Take 1 kit by mouth once for 1 dose. 324 mL 0   No current facility-administered medications for this visit.   No Known Allergies   Review of Systems: Positive for arthritis and back pain.  All other systems reviewed and negative except where noted in HPI.   Creatinine clearance cannot be calculated (Patient's most recent lab result is older than the maximum 21 days allowed.)   Physical Exam:    Wt Readings from Last 3 Encounters:  01/14/20 203 lb 3.2 oz (92.2 kg)  12/21/19 204 lb 12.8 oz (92.9 kg)  12/16/19 207 lb 4.8 oz (94 kg)    BP 132/64   Pulse 64   Temp 98.3 F (36.8 C)   Ht 5' 4"  (1.626 m)   Wt 203 lb 3.2 oz (92.2 kg)   BMI 34.88 kg/m  Constitutional:  Pleasant female in no acute distress. Psychiatric: Normal mood and affect. Behavior is normal. EENT: Pupils normal.  Conjunctivae are normal. No scleral icterus. Neck supple.  Cardiovascular: Normal rate, regular rhythm. No edema Pulmonary/chest: Effort normal and breath sounds normal. No wheezing, rales or rhonchi. Abdominal: Soft, nondistended, nontender. Bowel sounds active throughout. There are no masses palpable. No hepatomegaly. Neurological: Alert and oriented to person place and time. Skin: Skin is warm and dry. No rashes noted.  Tye Savoy, NP  01/14/2020, 5:04 PM  Cc:  Referring  Provider Forrest Moron, MD

## 2020-01-15 NOTE — Progress Notes (Signed)
Attending Physician's Attestation   I have reviewed the chart.   I agree with the Advanced Practitioner's note, impression, and recommendations with any updates as below.  Agree with colon cancer screening.  Okay for now to hold on endoscopy if OTC Nexium is not being used on a daily basis but may want to consider in future potential upper endoscopy to ensure no evidence of Barrett's although she does not meet typical criteria for screening.  Reasonable to evaluate her liver biochemical testing and rewith further work-up.  At some point may consider a liver biopsy if no other etiology is determined.  Justice Britain, MD [facility] Advanced [facility]oscopy Office # [phone]

## 2020-01-17 ENCOUNTER — Telehealth: Payer: Self-pay

## 2020-01-17 NOTE — Telephone Encounter (Signed)
-----   Message from Willia Craze, NP sent at 01/17/2020 12:06 PM EDT ----- Faythe Ghee, thanks. Will you convert this to a phone note please. Thanks ----- Message ----- From: Greggory Keen, LPN Sent: [id number]  10:03 AM EDT To: Willia Craze, NP  I called the patient. She states her PCP told her "it's because I am fat and have diabetes." She has been vaccinated for Hep B. She states she had a negative Hep C test also. She does not remember where, but she remembers it was done. She is not interested in pursuing the work up at this time. ----- Message ----- From: Willia Craze, NP Sent: 01/14/2020   5:29 PM EDT To: Greggory Keen, LPN  Beth, I saw this lady for a colon cancer screening. After she left clinic I noticed that her liver test have been abnormal for many years.  I do not see that she has had work-up for this.  I know she has fatty liver disease seen on ultrasound but there are other potential causes which should be excluded. I tried to call her today, left a VM. Will you please get in touch. If she agrees then she could probably get the labs on the day of her colonoscopy if timing works out.  Otherwise she can come at her convenience.  I put the orders in already for what she needs. . Thanks

## 2020-01-19 ENCOUNTER — Encounter: Payer: Self-pay | Admitting: Registered"

## 2020-01-19 ENCOUNTER — Other Ambulatory Visit: Payer: Self-pay

## 2020-01-19 ENCOUNTER — Encounter: Payer: 59 | Attending: Family Medicine | Admitting: Registered"

## 2020-01-19 DIAGNOSIS — E1165 Type 2 diabetes mellitus with hyperglycemia: Secondary | ICD-10-CM | POA: Diagnosis not present

## 2020-01-19 NOTE — Patient Instructions (Addendum)
Instructions/Goals:  Great job checking blood sugar twice daily! Way to go! Ranges: 80-130 fasting. <180 after a meal.  Continue adding in more plain water. Great start!  Try to include consistent amount of carbohydrates at each meal: 2-3 carbohydrate choices (30-45 g carbs)  Work to meet the proportions of the healthy plate model (1/4 carbs, 1/4 protein, 1/2 non starchy vegetables)!  Continue eating every 3-4 hours  When having a snack, try to have a balanced snack with 1 carb choice and protein.   Make physical activity a part of your week. Try to include at least 30 minutes of physical activity 5 days each week or at least 150 minutes per week. Regular physical activity promotes overall health-including helping to reduce risk for heart disease and diabetes, promoting mental health, and helping [place] sleep better.    Add 10 minute walk to 1-2 days per week.

## 2020-01-19 NOTE — Progress Notes (Signed)
Diabetes Self-Management Education  Visit Type:    Appt. Start Time: 0940  Appt. End Time: 1010  01/19/2020  Sue Baker, identified by name and date of birth, is a 64 y.o. female with a diagnosis of Diabetes:    ASSESSMENT  Height 5\' 4"  (1.626 m), weight 203 lb 6.4 oz (92.3 kg). Body mass index is 34.91 kg/m.  Nutrition Follow-Up:   Pt present alone for appointment.   Pt reports she has started checking blood sugar twice daily, fasting and one time postprandial. Pt brought log notebook with her to appointment.   Over past 2 weeks fasting: 127-201; postprandial: 146-261. Reports she puts up post-it notes at work to remind herself to check after a meal. Reports noticing higher numbers after high carb meals. Reports last Monday she celebrated grandson's birthday and had meat, vegetables, rice and cake afterward and blood sugar was only 196 and she was expecting 200s. Reports she did watch her portions.   Pt reports she has added more water, also flavored water and green tea. Some Diet Soda.   Reports doing more spinach salads with protein and fruit for meals. Pt reports feeling good about what she needs to do and how to do it. 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 Reports working 12 hours overtime this week.   Reports gets about 6 hours sleep per night and feels pretty rested when waking. Reports she still rotates work shifts but has been for 30 years so feels pretty used to it.   Pt reports having more food cravings over past week than usual. Reports she has always been a snacker. Hard to avoid late night snacking. Unsure of cause. When asked if she thinks stress with busy work schedule may be reason, pt reports she does notice that she eats less weeks she doesn't work as much.   Lab Values:  12/21/19:  HgbA1c: 8.9  11/19/19: HgbA1c: 11.2 Triglycerides: 246 HDL: 35   24 Hour Recall: Breakfast (AM):  bacon, eggs, small amount of hashbrowns Snk (AM): None reported.  Lunch (PM): cucumber and ranch dressing Snk (PM): None reported.  Dinner (PM): bbq chicken, salad with croutons, apples (about 1 apple) Snk (11PM): Cheetos x at least 2 cups Beverages: Diet Soda.   Physical Activity Routine: Reports she rarely sits when off work. Reports she was remodeling bathroom recently. No consistent planned activities.   Individualized Plan for Diabetes Self-Management Training:   Learning Objective:  Patient will have a greater understanding of diabetes self-management. Patient education plan is to attend individual and/or group sessions per assessed needs and concerns.   Plan: Praised pt's progress with SMBG and adding more water. Discussed that having too few carbohydrates at meals can cause carb cravings later. Discussed trying to have 2-3 servings consistently at meals. Encouraged a 10 minute walk 1-2 days per week to gradually add more activity. Discussed that even short bouts like 10 minutes of physical activity are helpful to our health. Pt appeared agreeable to information/goals discussed.    Instructions/Goals:  Great job checking blood sugar twice daily! Way to go! Ranges: 80-130 fasting. <180 after a meal.  Continue adding in more plain water. Great start!  Try to include consistent amount of carbohydrates at each meal: 2-3 carbohydrate choices (30-45 g carbs)  Work to meet the proportions of the healthy plate model (1/4 carbs, 1/4 protein, 1/2 non starchy vegetables)!  Continue eating every 3-4 hours  When  having a snack, try to have a balanced snack with 1 carb choice and protein.   Make physical activity a part of your week. Try to include at least 30 minutes of physical activity 5 days each week or at least 150 minutes per week. Regular physical activity promotes overall health-including helping to reduce risk for heart disease and diabetes, promoting mental health, and helping [place]  sleep better.    Add 10 minute walk to 1-2 days per week.  Patient Instructions  Instructions/Goals:  Doristine Devoid job checking blood sugar twice daily! Way to go! Ranges: 80-130 fasting. <180 after a meal.  Continue adding in more plain water. Great start!  Try to include consistent amount of carbohydrates at each meal: 2-3 carbohydrate choices (30-45 g carbs)  Work to meet the proportions of the healthy plate model (1/4 carbs, 1/4 protein, 1/2 non starchy vegetables)!  Continue eating every 3-4 hours  When having a snack, try to have a balanced snack with 1 carb choice and protein.   Make physical activity a part of your week. Try to include at least 30 minutes of physical activity 5 days each week or at least 150 minutes per week. Regular physical activity promotes overall health-including helping to reduce risk for heart disease and diabetes, promoting mental health, and helping [place] sleep better.    Add 10 minute walk to 1-2 days per week.  Expected Outcomes:   Positive.   Education material provided: None provided.   If problems or questions, patient to contact team via:  Phone and Email  Future DSME appointment:  1 month.

## 2020-02-14 ENCOUNTER — Other Ambulatory Visit: Payer: Self-pay | Admitting: Family Medicine

## 2020-02-23 ENCOUNTER — Ambulatory Visit: Payer: 59 | Admitting: Registered"

## 2020-02-29 ENCOUNTER — Encounter: Payer: Self-pay | Admitting: Gastroenterology

## 2020-03-03 ENCOUNTER — Encounter: Payer: Self-pay | Admitting: Gastroenterology

## 2020-03-03 ENCOUNTER — Other Ambulatory Visit: Payer: Self-pay

## 2020-03-03 ENCOUNTER — Ambulatory Visit (AMBULATORY_SURGERY_CENTER): Payer: 59 | Admitting: Gastroenterology

## 2020-03-03 VITALS — BP 121/65 | HR 57 | Temp 97.1°F | Resp 19 | Ht 64.0 in | Wt 203.0 lb

## 2020-03-03 DIAGNOSIS — Z1211 Encounter for screening for malignant neoplasm of colon: Secondary | ICD-10-CM | POA: Diagnosis present

## 2020-03-03 DIAGNOSIS — D122 Benign neoplasm of ascending colon: Secondary | ICD-10-CM | POA: Diagnosis not present

## 2020-03-03 DIAGNOSIS — D120 Benign neoplasm of cecum: Secondary | ICD-10-CM

## 2020-03-03 DIAGNOSIS — D123 Benign neoplasm of transverse colon: Secondary | ICD-10-CM | POA: Diagnosis not present

## 2020-03-03 DIAGNOSIS — D127 Benign neoplasm of rectosigmoid junction: Secondary | ICD-10-CM

## 2020-03-03 DIAGNOSIS — D128 Benign neoplasm of rectum: Secondary | ICD-10-CM

## 2020-03-03 HISTORY — PX: COLONOSCOPY: SHX174

## 2020-03-03 MED ORDER — SODIUM CHLORIDE 0.9 % IV SOLN: 500.0000 mL | Freq: Once | INTRAVENOUS | Status: DC

## 2020-03-03 NOTE — Op Note (Signed)
[place] Patient Name: Sue Baker Procedure Date: 03/03/2020 11:01 AM MRN: [id number] Endoscopist: Justice Britain , MD Age: 64 Referring MD:  Date of Birth: [birth year] Gender: Female Account #: [id number] Procedure:                Colonoscopy Indications:              Screening for colorectal malignant neoplasm Medicines:                Monitored Anesthesia Care Procedure:                Pre-Anesthesia Assessment:                           - Prior to the procedure, a History and Physical                            was performed, and patient medications and                            allergies were reviewed. The patient's tolerance of                            previous anesthesia was also reviewed. The risks                            and benefits of the procedure and the sedation                            options and risks were discussed with the patient.                            All questions were answered, and informed consent                            was obtained. Prior Anticoagulants: The patient has                            taken no previous anticoagulant or antiplatelet                            agents except for aspirin. ASA Grade Assessment: II                            - A patient with mild systemic disease. After                            reviewing the risks and benefits, the patient was                            deemed in satisfactory condition to undergo the                            procedure.  After obtaining informed consent, the colonoscope                            was passed under direct vision. Throughout the                            procedure, the patient's blood pressure, pulse, and                            oxygen saturations were monitored continuously. The                            Colonoscope was introduced through the anus and                            advanced to the 5 cm into the ileum. The                             colonoscopy was performed without difficulty. The                            patient tolerated the procedure. The quality of the                            bowel preparation was adequate. The terminal ileum,                            ileocecal valve, appendiceal orifice, and rectum                            were photographed. Scope In: 11:21:30 AM Scope Out: 11:45:52 AM Scope Withdrawal Time: 0 hours 21 minutes 1 second  Total Procedure Duration: 0 hours 24 minutes 22 seconds  Findings:                 The digital rectal exam findings include                            hemorrhoids and skin tags. Pertinent negatives                            include no palpable rectal lesions.                           The terminal ileum and ileocecal valve appeared                            normal.                           Seven sessile polyps were found in the rectum (1),                            recto-sigmoid colon (1), splenic flexure (1),  transverse colon (1), ascending colon (1) and cecum                            (2). The polyps were 2 to 10 mm in size. These                            polyps were removed with a cold snare. Resection                            and retrieval were complete.                           A few small-mouthed diverticula were found in the                            recto-sigmoid colon and sigmoid colon.                           Normal mucosa was found in the entire colon                            otherwise.                           Non-bleeding non-thrombosed external and internal                            hemorrhoids were found during retroflexion, during                            perianal exam and during digital exam. The                            hemorrhoids were Grade II (internal hemorrhoids                            that prolapse but reduce spontaneously). Complications:            No immediate  complications. Estimated Blood Loss:     Estimated blood loss was minimal. Impression:               - Hemorrhoids found on digital rectal exam.                           - The examined portion of the ileum was normal.                           - Seven 2 to 10 mm polyps in the rectum, at the                            recto-sigmoid colon, at the splenic flexure, in the                            transverse colon, in the ascending colon and in the  cecum, removed with a cold snare. Resected and                            retrieved.                           - Diverticulosis in the recto-sigmoid colon and in                            the sigmoid colon.                           - Normal mucosa in the entire examined colon                            otherwise.                           - Non-bleeding non-thrombosed external and internal                            hemorrhoids. Recommendation:           - The patient will be observed post-procedure,                            until all discharge criteria are met.                           - Discharge patient to home.                           - Patient has a contact number available for                            emergencies. The signs and symptoms of potential                            delayed complications were discussed with the                            patient. Return to normal activities tomorrow.                            Written discharge instructions were provided to the                            patient.                           - High fiber diet.                           - Use FiberCon 1-2 tablets PO daily.                           - Continue present medications.                           -  Await pathology results.                           - Most likely, repeat colonoscopy in 3 - 5 years                            for surveillance based on pathology results.                           - The  findings and recommendations were discussed                            with the patient. Justice Britain, MD 03/03/2020 11:53:13 AM

## 2020-03-03 NOTE — Progress Notes (Addendum)
Pt's states no medical or surgical changes since previsit or office visit.  ° °Vitals CW °

## 2020-03-03 NOTE — Patient Instructions (Signed)
HANDOUTS PROVIDED ON: HIGH FIBER DIET, POLYPS, DIVERTICULOSIS, & HEMORRHOIDS  The polyps removed today have been sent for pathology.  The results can take 1-3 weeks to receive.  When your next colonoscopy should occur will be based on the pathology results.    You may resume your previous diet and medication schedule.  Use FiberCon 1-2 tablets daily.  Thank you for allowing [place] to care for you today!!!   YOU HAD AN ENDOSCOPIC PROCEDURE TODAY AT [place]:   Refer to the procedure report that was given to you for any specific questions about what was found during the examination.  If the procedure report does not answer your questions, please call your gastroenterologist to clarify.  If you requested that your care partner not be given the details of your procedure findings, then the procedure report has been included in a sealed envelope for you to review at your convenience later.  YOU SHOULD EXPECT: Some feelings of bloating in the abdomen. Passage of more gas than usual.  Walking can help get rid of the air that was put into your GI tract during the procedure and reduce the bloating. If you had a lower endoscopy (such as a colonoscopy or flexible sigmoidoscopy) you may notice spotting of blood in your stool or on the toilet paper. If you underwent a bowel prep for your procedure, you may not have a normal bowel movement for a few days.  Please Note:  You might notice some irritation and congestion in your nose or some drainage.  This is from the oxygen used during your procedure.  There is no need for concern and it should clear up in a day or so.  SYMPTOMS TO REPORT IMMEDIATELY:   Following lower endoscopy (colonoscopy or flexible sigmoidoscopy):  Excessive amounts of blood in the stool  Significant tenderness or worsening of abdominal pains  Swelling of the abdomen that is new, acute  Fever of 100F or higher  For urgent or emergent issues, a gastroenterologist can be  reached at any hour by calling [phone]. Do not use MyChart messaging for urgent concerns.    DIET:  We do recommend a small meal at first, but then you may proceed to your regular diet.  Drink plenty of fluids but you should avoid alcoholic beverages for 24 hours.  ACTIVITY:  You should plan to take it easy for the rest of today and you should NOT DRIVE or use heavy machinery until tomorrow (because of the sedation medicines used during the test).    FOLLOW UP: Our staff will call the number listed on your records 48-72 hours following your procedure to check on you and address any questions or concerns that you may have regarding the information given to you following your procedure. If we do not reach you, we will leave a message.  We will attempt to reach you two times.  During this call, we will ask if you have developed any symptoms of COVID 19. If you develop any symptoms (ie: fever, flu-like symptoms, shortness of breath, cough etc.) before then, please call [phone].  If you test positive for Covid 19 in the 2 weeks post procedure, please call and report this information to [place].    If any biopsies were taken you will be contacted by phone or by letter within the next 1-3 weeks.  Please call us at [phone] if you have not heard about the biopsies in 3 weeks.    SIGNATURES/CONFIDENTIALITY: You  and/or your care partner have signed paperwork which will be entered into your electronic medical record.  These signatures attest to the fact that that the information above on your After Visit Summary has been reviewed and is understood.  Full responsibility of the confidentiality of this discharge information lies with you and/or your care-partner.

## 2020-03-03 NOTE — Progress Notes (Signed)
PT taken to PACU. Monitors in place. VSS. Report given to RN.  Propofol given at beginning and throughout procedure to maintain safe and appropriate comfort level. VO/MD.  K.Kary Colaizzi, CRNA 

## 2020-03-03 NOTE — Progress Notes (Signed)
Called to room to assist during endoscopic procedure.  Patient ID and intended procedure confirmed with present staff. Received instructions for my participation in the procedure from the performing physician.  

## 2020-03-07 ENCOUNTER — Encounter: Payer: Self-pay | Admitting: Gastroenterology

## 2020-03-07 ENCOUNTER — Other Ambulatory Visit: Payer: Self-pay

## 2020-03-07 ENCOUNTER — Telehealth: Payer: Self-pay

## 2020-03-07 ENCOUNTER — Emergency Department (HOSPITAL_COMMUNITY): Payer: No Typology Code available for payment source

## 2020-03-07 ENCOUNTER — Encounter (HOSPITAL_COMMUNITY): Payer: Self-pay | Admitting: Emergency Medicine

## 2020-03-07 ENCOUNTER — Emergency Department (HOSPITAL_COMMUNITY)
Admission: EM | Admit: 2020-03-07 | Discharge: 2020-03-08 | Disposition: A | Discharge status: Home / Self Care | Payer: No Typology Code available for payment source | Attending: Emergency Medicine | Admitting: Emergency Medicine

## 2020-03-07 DIAGNOSIS — Z87891 Personal history of nicotine dependence: Secondary | ICD-10-CM | POA: Insufficient documentation

## 2020-03-07 DIAGNOSIS — M25561 Pain in right knee: Secondary | ICD-10-CM | POA: Insufficient documentation

## 2020-03-07 DIAGNOSIS — X509XXA Other and unspecified overexertion or strenuous movements or postures, initial encounter: Secondary | ICD-10-CM | POA: Insufficient documentation

## 2020-03-07 DIAGNOSIS — Y990 Civilian activity done for income or pay: Secondary | ICD-10-CM | POA: Insufficient documentation

## 2020-03-07 DIAGNOSIS — Y9289 Other specified places as the place of occurrence of the external cause: Secondary | ICD-10-CM | POA: Diagnosis not present

## 2020-03-07 DIAGNOSIS — I10 Essential (primary) hypertension: Secondary | ICD-10-CM | POA: Diagnosis not present

## 2020-03-07 DIAGNOSIS — Z7984 Long term (current) use of oral hypoglycemic drugs: Secondary | ICD-10-CM | POA: Diagnosis not present

## 2020-03-07 DIAGNOSIS — Z79899 Other long term (current) drug therapy: Secondary | ICD-10-CM | POA: Diagnosis not present

## 2020-03-07 DIAGNOSIS — Z7982 Long term (current) use of aspirin: Secondary | ICD-10-CM | POA: Diagnosis not present

## 2020-03-07 DIAGNOSIS — Y9389 Activity, other specified: Secondary | ICD-10-CM | POA: Insufficient documentation

## 2020-03-07 DIAGNOSIS — E119 Type 2 diabetes mellitus without complications: Secondary | ICD-10-CM | POA: Diagnosis not present

## 2020-03-07 DIAGNOSIS — E039 Hypothyroidism, unspecified: Secondary | ICD-10-CM | POA: Insufficient documentation

## 2020-03-07 NOTE — ED Triage Notes (Signed)
Patient injured her right knee this evening , slipped on wet floor and twisted her right knee , pain increases with movement / mild swelling .

## 2020-03-07 NOTE — Telephone Encounter (Signed)
Left message on follow up call. 

## 2020-03-07 NOTE — Telephone Encounter (Signed)
  Follow up Call-  Call back number 03/03/2020  Post procedure Call Back phone  # [phone]  Permission to leave phone message Yes  Some recent data might be hidden     Patient questions:  Do you have a fever, pain , or abdominal swelling? No. Pain Score  0 *  Have you tolerated food without any problems? Yes.    Have you been able to return to your normal activities? Yes.    Do you have any questions about your discharge instructions: Diet   No. Medications  No. Follow up visit  No.  Do you have questions or concerns about your Care? No.  Actions: * If pain score is 4 or above: No action needed, pain <4.   1. Have you developed a fever since your procedure? no  2.   Have you had an respiratory symptoms (SOB or cough) since your procedure? no  3.   Have you tested positive for COVID 19 since your procedure? no  4.   Have you had any family members/close contacts diagnosed with the COVID 19 since your procedure?  no   If yes to any of these questions please route to Joylene John, RN and Erenest Rasher, RN

## 2020-03-08 NOTE — Progress Notes (Signed)
Orthopedic Tech Progress Note Patient Details:  Sue Baker May 27, 1956 [id number]  Ortho Devices Type of Ortho Device: Crutches, Knee Immobilizer Ortho Device/Splint Location: rle Ortho Device/Splint Interventions: Ordered, Application, Adjustment   Post Interventions Patient Tolerated: Well Instructions Provided: Care of device, Adjustment of device   Karolee Stamps 03/08/2020, 6:49 AM

## 2020-03-08 NOTE — Discharge Instructions (Signed)
Please use your crutches as needed for comfort.  You may take the knee brace off to shower and further hygiene activities however I would recommend that you not put weight on your right leg without having the knee brace on.

## 2020-03-08 NOTE — ED Provider Notes (Signed)
[facility] EMERGENCY DEPARTMENT Provider Note   CSN: [id number] Arrival date & time: 03/07/20  2025     History Chief Complaint  Patient presents with  . Knee Injury    Sue Baker is a 64 y.o. female with past medical history of DM, hypertension, hyperlipidemia who presents today for evaluation of right knee pain.  She reports that she was at work when she slipped causing her to twist her right knee.  She denies falling to the ground, any direct hit on the knee or other injury. She had previously injured this knee and followed with [facility].  She has been unable to bear weight due to pain.   HPI     Past Medical History:  Diagnosis Date  . Allergy   . Arthritis   . Cataract   . Diabetes mellitus without complication ([place])   . GERD (gastroesophageal reflux disease)   . Hyperlipidemia   . Hypertension   . Sleep apnea   . Thyroid disease     Patient Active Problem List   Diagnosis Date Noted  . Noncompliance w/medication treatment due to intermit use of medication 11/18/2017  . Arthritis 01/06/2015  . Hyperlipidemia LDL goal <70 02/08/2014  . OSA on CPAP 02/08/2014  . Morbid obesity ([place]) 02/08/2014  . Depression 02/08/2014  . Osteoarthritis of hand 05/21/2013  . Diabetes mellitus ([place]) 09/29/2012  . Polypharmacy 09/29/2012  . Hypertension 09/29/2012  . Arthritis of both knees 09/29/2012  . Irregular menses 09/29/2012  . Hypothyroid 09/29/2012  . GERD (gastroesophageal reflux disease) 09/29/2012  . Mood disorder ([place]) 09/29/2012    Past Surgical History:  Procedure Laterality Date  . BREAST SURGERY    . CARPAL TUNNEL RELEASE    . COLONOSCOPY  03/03/2020  . EYE SURGERY    . KNEE SURGERY     left  . TUBAL LIGATION       OB History   No obstetric history on file.     Family History  Problem Relation Age of Onset  . Healthy Mother   . Heart disease Father   . Peripheral Artery Disease Father   . Hypertension  Father   . Thyroid disease Sister   . Cancer Sister   . Hypertension Maternal Grandfather   . Heart disease Maternal Grandfather   . Diabetes Maternal Grandfather   . Colon cancer Neg Hx   . Colon polyps Neg Hx   . Esophageal cancer Neg Hx   . Rectal cancer Neg Hx   . Stomach cancer Neg Hx     Social History   Tobacco Use  . Smoking status: Former Smoker    Quit date: 09/30/1999    Years since quitting: 20.4  . Smokeless tobacco: Never Used  Vaping Use  . Vaping Use: Never used  Substance Use Topics  . Alcohol use: Yes    Comment: rarely  . Drug use: No    Home Medications Prior to Admission medications   Medication Sig Start Date End Date Taking? Authorizing Provider  aspirin 81 MG tablet Take 81 mg by mouth daily.    [provider]  blood glucose meter kit and supplies KIT Dispense based on patient and insurance preference. Use up to four times daily as directed. (FOR ICD-10 E11.9) 12/21/19   Forrest Moron, MD  cyclobenzaprine (FLEXERIL) 10 MG tablet Take 10 mg by mouth 3 (three) times daily as needed for muscle spasms.    [provider]  escitalopram [place]ous[place])  10 MG tablet TAKE 1 TABLET BY MOUTH DAILY 09/22/19   Delia Chimes A, MD  esomeprazole (NEXIUM 24HR) 20 MG capsule Take 1 capsule (20 mg total) by mouth daily at 12 noon. 01/14/20   Willia Craze, NP  fenofibrate (TRICOR) 145 MG tablet Take 1 tablet (145 mg total) by mouth daily. 11/19/19   Forrest Moron, MD  levothyroxine (SYNTHROID) 150 MCG tablet TAKE 1 TABLET BY MOUTH DAILY BEFORE BREAKFAST 11/23/19   Delia Chimes A, MD  meloxicam (MOBIC) 15 MG tablet TAKE 1 TABLET BY MOUTH EVERY DAY AS NEEDED FOR PAIN 11/19/19   Forrest Moron, MD  Multiple Vitamins-Minerals (WOMENS MULTI VITAMIN & MINERAL) TABS Take by mouth.    [provider]  olmesartan (BENICAR) 20 MG tablet TAKE 1 TABLET BY MOUTH EVERY DAY 02/14/20   Rutherford Guys, MD  [facility] VERIO test strip USE AS DIRECTED 01/12/20    Forrest Moron, MD  rosuvastatin (CRESTOR) 40 MG tablet Take 1 tablet (40 mg total) by mouth daily. 11/19/19   Forrest Moron, MD  Saxagliptin-Metformin (KOMBIGLYZE XR) 2.01-999 MG TB24 Take 2 tablets by mouth daily. 11/19/19   Forrest Moron, MD  traMADol (ULTRAM) 50 MG tablet Take 1-2 tablets (50-100 mg total) by mouth every 8 (eight) hours as needed. 11/19/19   Forrest Moron, MD    Allergies    Trulicity [dulaglutide]  Review of Systems   Review of Systems  Constitutional: Negative for chills and fever.  Neurological: Negative for weakness and numbness.  All other systems reviewed and are negative.   Physical Exam Updated Vital Signs BP 138/80   Pulse 78   Temp 98.6 F (37 C) (Oral)   Resp 15   Ht 5' 4" (1.626 m)   Wt 101 kg   SpO2 98%   BMI 38.22 kg/m   Physical Exam Vitals and nursing note reviewed.  Constitutional:      General: She is not in acute distress.    Appearance: She is not ill-appearing.  HENT:     Head: Normocephalic.  Cardiovascular:     Rate and Rhythm: Normal rate.     Pulses: Normal pulses.     Comments: 2+ right DP/PT pulse. Pulmonary:     Effort: Pulmonary effort is normal. No respiratory distress.  Musculoskeletal:     Comments: RLE: Patient has limited ROM of right knee due to pain however she is able to bend and flex her right knee.  There is no obvious crepitus or deformity.  No localized tenderness to palpation of the right knee.  No significant right knee effusion.  Compartments in right upper and lower leg are soft, easily compressible.  Knee is grossly stable on my exam.  Patient has active ankle dorsiflexion and plantar flexion  Skin:    General: Skin is warm and dry.  Neurological:     Mental Status: She is alert.     Sensory: No sensory deficit (Sensation intact to right foot to light touch.).     ED Results / Procedures / Treatments   Labs (all labs ordered are listed, but only abnormal results are displayed) Labs  Reviewed - No data to display  EKG None  Radiology DG Knee Complete 4 Views Right  Result Date: 03/07/2020 CLINICAL DATA:  Injury, pain EXAM: RIGHT KNEE - COMPLETE 4+ VIEW COMPARISON:  None. FINDINGS: Advanced degenerative changes in the patellofemoral compartment. No joint effusion. No acute bony abnormality. Specifically, no fracture, subluxation, or dislocation.  IMPRESSION: Degenerative changes most pronounced in the patellofemoral compartment. No acute bony abnormality. Electronically Signed   By: Kevin  Dover M.D.   On: 03/07/2020 22:04    Procedures Procedures (including critical care time)  Medications Ordered in ED Medications - No data to display  ED Course  I have reviewed the triage vital signs and the nursing notes.  Pertinent labs & imaging results that were available during my care of the patient were reviewed by me and considered in my medical decision making (see chart for details).    MDM Rules/Calculators/A&P                         Patient is a 64-year-old woman who presents today for evaluation of pain in her right knee after she was walking "twisted" her knee.  She did not fall to the ground and denies any other injuries.  She is neurovascularly intact to her right lower extremity on my exam.  Trays obtained showing degenerative changes without evidence of acute fracture, subluxation dislocation or other osseous abnormality.  No significant effusion on exam.  Compartments are soft and easily compressible.  Based on the mechanism, low suspicion for occult fracture as there was not a direct blow to the knee and I am able to palpate the knee without eliciting significant pain.    Suspect soft tissue injury.  She was given a knee immobilizer and crutches.  She is already established with [facility], I recommended that she follow-up with them.  She declined prescription for pain medicine.  Return precautions were discussed with patient who states their  understanding.  At the time of discharge patient denied any unaddressed complaints or concerns.  Patient is agreeable for discharge home.  Note: Portions of this report may have been transcribed using voice recognition software. Every effort was made to ensure accuracy; however, inadvertent computerized transcription errors may be present   Final Clinical Impression(s) / ED Diagnoses Final diagnoses:  Acute pain of right knee    Rx / DC Orders ED Discharge Orders    None       Hammond, Elizabeth W, PA-C 03/08/20 0626    Glick, David, MD 03/08/20 0652  

## 2020-03-16 ENCOUNTER — Telehealth: Payer: Self-pay | Admitting: Cardiovascular Disease

## 2020-03-16 NOTE — Telephone Encounter (Signed)
    Pt said she needs a new cpap mask and hose. She said it was Dr. Claiborne Billings who ordered it for her before

## 2020-05-13 ENCOUNTER — Other Ambulatory Visit: Payer: Self-pay | Admitting: Family Medicine

## 2020-05-13 NOTE — Telephone Encounter (Signed)
Requested Prescriptions  Pending Prescriptions Disp Refills  . olmesartan (BENICAR) 20 MG tablet [Pharmacy Med Name: OLMESARTAN MEDOXOMIL 20 MG TAB] 90 tablet 0    Sig: TAKE 1 TABLET BY MOUTH EVERY DAY     Cardiovascular:  Angiotensin Receptor Blockers Passed - 05/13/2020  9:00 AM      Passed - Cr in normal range and within 180 days    Creat  Date Value Ref Range Status  06/20/2016 1.04 0.50 - 1.05 mg/dL Final    Comment:      For patients > or = 62 years of age: The upper reference limit for Creatinine is approximately 13% higher for people identified as African-American.      Creatinine, Ser  Date Value Ref Range Status  11/19/2019 0.70 0.57 - 1.00 mg/dL Final   Creatinine, Urine  Date Value Ref Range Status  06/20/2016 129 20 - 320 mg/dL Final         Passed - K in normal range and within 180 days    Potassium  Date Value Ref Range Status  11/19/2019 4.4 3.5 - 5.2 mmol/L Final         Passed - Patient is not pregnant      Passed - Last BP in normal range    BP Readings from Last 1 Encounters:  03/08/20 138/80         Passed - Valid encounter within last 6 months    Recent Outpatient Visits          4 months ago Screening for colon cancer   Primary Care at [facility], Zoe A, MD   5 months ago Type 2 diabetes mellitus with hyperglycemia, without long-term current use of insulin ([place])   Primary Care at [facility], Zoe A, MD   1 year ago Type 2 diabetes mellitus with hyperglycemia, without long-term current use of insulin [facility])   Primary Care at [facility], Ines Bloomer, MD   1 year ago Acquired hypothyroidism   Primary Care at [facility], [place] A, MD   1 year ago Type 2 diabetes mellitus with complication, without long-term current use of insulin [facility])   Primary Care at [facility], Arlie Solomons, MD

## 2020-06-14 ENCOUNTER — Encounter: Payer: Self-pay | Admitting: Cardiovascular Disease

## 2020-06-14 ENCOUNTER — Other Ambulatory Visit: Payer: Self-pay

## 2020-06-14 ENCOUNTER — Ambulatory Visit: Payer: 59 | Admitting: Cardiovascular Disease

## 2020-06-14 ENCOUNTER — Other Ambulatory Visit: Payer: Self-pay | Admitting: Family Medicine

## 2020-06-14 VITALS — BP 125/57 | HR 63 | Ht 64.0 in | Wt 212.6 lb

## 2020-06-14 DIAGNOSIS — R0600 Dyspnea, unspecified: Secondary | ICD-10-CM

## 2020-06-14 DIAGNOSIS — R0609 Other forms of dyspnea: Secondary | ICD-10-CM

## 2020-06-14 DIAGNOSIS — E118 Type 2 diabetes mellitus with unspecified complications: Secondary | ICD-10-CM

## 2020-06-14 DIAGNOSIS — E039 Hypothyroidism, unspecified: Secondary | ICD-10-CM

## 2020-06-14 DIAGNOSIS — I10 Essential (primary) hypertension: Secondary | ICD-10-CM | POA: Diagnosis not present

## 2020-06-14 DIAGNOSIS — Z9989 Dependence on other enabling machines and devices: Secondary | ICD-10-CM | POA: Diagnosis not present

## 2020-06-14 DIAGNOSIS — G4733 Obstructive sleep apnea (adult) (pediatric): Secondary | ICD-10-CM | POA: Diagnosis not present

## 2020-06-14 DIAGNOSIS — E669 Obesity, unspecified: Secondary | ICD-10-CM

## 2020-06-14 DIAGNOSIS — Z780 Asymptomatic menopausal state: Secondary | ICD-10-CM

## 2020-06-14 NOTE — Patient Instructions (Signed)
Medication Instructions:  CONTINUE WITH CURRENT MEDICATIONS. NO CHANGES.  *If you need a refill on your cardiac medications before your next appointment, please call your pharmacy*    Testing/Procedures: Your physician has requested that you have an echocardiogram. Echocardiography is a painless test that uses sound waves to create images of your heart. It provides your doctor with information about the size and shape of your heart and how well your heart's chambers and valves are working. This procedure takes approximately one hour. There are no restrictions for this procedure.  Your physician has requested that you have an exercise tolerance test. For further information please visit [email or web address]. Please also follow instruction sheet, as given.     Follow-Up: At [facility], you and your health needs are our priority.  As part of our continuing mission to provide you with exceptional heart care, we have created designated Provider Care Teams.  These Care Teams include your primary Cardiologist (physician) and Advanced Practice Providers (APPs -  Physician Assistants and Nurse Practitioners) who all work together to provide you with the care you need, when you need it.  We recommend signing up for the patient portal called "MyChart".  Sign up information is provided on this After Visit Summary.  MyChart is used to connect with patients for Virtual Visits (Telemedicine).  Patients are able to view lab/test results, encounter notes, upcoming appointments, etc.  Non-urgent messages can be sent to your provider as well.   To learn more about what you can do with MyChart, go to [email or web address].    Your next appointment:   2 month(s)  The format for your next appointment:   In Person  Provider:   Shelva Majestic, MD

## 2020-06-14 NOTE — Progress Notes (Unsigned)
Please let patient know that given her tibial plateau fracture a bone density to evaluate for osteoporosis is recommended.   [facility].   thanks

## 2020-06-14 NOTE — Progress Notes (Signed)
Cardiology Office Note    Date:  06/15/2020   ID:  Sue Baker, DOB Nov 22, 1955, MRN [id number]  PCP:  Patient, No Pcp Per  Cardiologist:  Shelva Majestic, MD   26 month F/U cardiology/sleep  evaluation, initially referred through the courtesy of Dr. Delman Cheadle  History of Present Illness:  Sue Baker is a 64 y.o. female who presents to the office today for a 27 month cardiology evaluation.    Sue Baker is originally from [place] and moved here with [name] when she was transferred to the [place] area.  She has a long-standing history of hypertension, diabetes mellitus for at least 6 years, hyperlipidemia, and has a diagnosis of obstructive sleep apnea for almost 20 years.  She received a new CPAP machine in 2015.  She continues to work.  She has had difficulty with right leg numbness and also has noticed some shortness of breath with activity.  She denies any definitive lower extremity pain with walking but admits to numbness.   She has difficulty with knee discomfort.  She has noticed episodes of some stress mediated chest pressure but denies any clear-cut exertional precipitation.  Her last stress test was in [place], [place] at least 8 years ago.    She established care with me in May 2019 and I saw her for follow-up evaluation on March 23, 2008.  She underwent an echo Doppler study on Feb 11, 2018 which essentially was normal.  EF was 60 to 65%.  She had normal diastolic parameters.  There was no significant valvular abnormalities.  She has had issues with back pain and has seen a neurologist and is felt to have symptoms related to L5.  She denies any chest pain.  She is on CPAP for obstructive sleep apnea and uses [facility].    She has had some issues with varicose veins.  She underwent lower extremity arterial Doppler and had normal bilateral ankle-brachial indices.    Since I last evaluated her, she has continued to use CPAP therapy.  Her previous DME company was [employer]  which was purchased by [employer].  She is in need for new supplies.  She contacted [employer] who is now her DME provider but they have no records of her sleep apnea history.  For this reason she presented to the office today so that we can provide information to [employer] and ultimately have her link to our office for care.  She continues to work 12-hour shifts for [employer].  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  Over the past 4 to 5 months she has noticed shortness of breath with activity but denies associated chest tightness..  She is on olmesartan 20 mg for hypertension and is on rosuvastatin 40 mg and fenofibrate for hyperlipidemia.  She is on combination therapy with saxagliptin and Metformin for her diabetes mellitus and takes Lexapro for mild depression.    Apparently, I had seen her in 2015 and at that time she told me that she was diagnosed with sleep apnea approximately 15 years prior and recall that she stopped breathing approximately 1 time per minute which correlates to an AHI of approximately 60.  She had been on CPAP therapy until 2015 when I saw her.  Her machine had just broken and was nonfunctional.  She was unable to sleep without CPAP in her sleep quality was poor and she was snoring excessively.  Apparently she underwent a sleep evaluation at the [facility]  and received a new ResMed air sense 10 machine.  I try to obtain records of her sleep study at [facility] sleep center but this was not in the epic data since this was done prior to [place] being a part of [place].  However since obtaining a new machine she admits to 100% compliance.  She presents for evaluation.   Past Medical History:  Diagnosis Date  . Allergy   . Arthritis   . Cataract   . Diabetes mellitus without complication ([place])   . GERD (gastroesophageal reflux disease)   . Hyperlipidemia   . Hypertension   . Sleep apnea   . Thyroid disease     Past Surgical History:  Procedure  Laterality Date  . BREAST SURGERY    . CARPAL TUNNEL RELEASE    . COLONOSCOPY  03/03/2020  . EYE SURGERY    . KNEE SURGERY     left  . TUBAL LIGATION      Current Medications: Outpatient Medications Prior to Visit  Medication Sig Dispense Refill  . aspirin 81 MG tablet Take 81 mg by mouth daily.    . blood glucose meter kit and supplies KIT Dispense based on patient and insurance preference. Use up to four times daily as directed. (FOR ICD-10 E11.9) 1 each 0  . cyclobenzaprine (FLEXERIL) 10 MG tablet Take 10 mg by mouth 3 (three) times daily as needed for muscle spasms.    Marland Kitchen escitalopram (LEXAPRO) 10 MG tablet TAKE 1 TABLET BY MOUTH DAILY 90 tablet 3  . esomeprazole (NEXIUM 24HR) 20 MG capsule Take 1 capsule (20 mg total) by mouth daily at 12 noon.    . fenofibrate (TRICOR) 145 MG tablet Take 1 tablet (145 mg total) by mouth daily. 90 tablet 1  . levothyroxine (SYNTHROID) 150 MCG tablet TAKE 1 TABLET BY MOUTH DAILY BEFORE BREAKFAST 90 tablet 1  . meloxicam (MOBIC) 15 MG tablet TAKE 1 TABLET BY MOUTH EVERY DAY AS NEEDED FOR PAIN 90 tablet 0  . Multiple Vitamins-Minerals (WOMENS MULTI VITAMIN & MINERAL) TABS Take by mouth.    . olmesartan (BENICAR) 20 MG tablet TAKE 1 TABLET BY MOUTH EVERY DAY 90 tablet 0  . ONETOUCH VERIO test strip USE AS DIRECTED 100 strip 5  . rosuvastatin (CRESTOR) 40 MG tablet Take 1 tablet (40 mg total) by mouth daily. 90 tablet 1  . Saxagliptin-Metformin (KOMBIGLYZE XR) 2.01-999 MG TB24 Take 2 tablets by mouth daily. 180 tablet 1  . traMADol (ULTRAM) 50 MG tablet Take 1-2 tablets (50-100 mg total) by mouth every 8 (eight) hours as needed. 30 tablet 0   Facility-Administered Medications Prior to Visit  Medication Dose Route Frequency Provider Last Rate Last Admin  . 0.9 %  sodium chloride infusion  500 mL Intravenous Once Mansouraty, Telford Nab., MD         Allergies:   Trulicity [dulaglutide]   Social History   Socioeconomic History  . Marital status:  Single    Spouse name: Not on file  . Number of children: Not on file  . Years of education: 37  . Highest education level: Some college, no degree  Occupational History    Employer: [employer]  Tobacco Use  . Smoking status: Former Smoker    Quit date: 09/30/1999    Years since quitting: 20.7  . Smokeless tobacco: Never Used  Vaping Use  . Vaping Use: Never used  Substance and Sexual Activity  . Alcohol use: Yes    Comment:  rarely  . Drug use: No  . Sexual activity: Not Currently  Other Topics Concern  . Not on file  Social History Narrative   Lives in a one story home.  Her daughter and grandson live with her.  Works at [employer]. 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  Education: some college.    Social Determinants of Health   Financial Resource Strain:   . Difficulty of Paying Living Expenses: Not on file  Food Insecurity:   . Worried About Charity fundraiser in the Last Year: Not on file  . Ran Out of Food in the Last Year: Not on file  Transportation Needs:   . Lack of Transportation (Medical): Not on file  . Lack of Transportation (Non-Medical): Not on file  Physical Activity:   . Days of Exercise per Week: Not on file  . Minutes of Exercise per Session: Not on file  Stress:   . Feeling of Stress : Not on file  Social Connections:   . Frequency of Communication with Friends and Family: Not on file  . Frequency of Social Gatherings with Friends and Family: Not on file  . Attends Religious Services: Not on file  . Active Member of Clubs or Organizations: Not on file  . Attends Archivist Meetings: Not on file  . Marital Status: Not on file    Additional social history is notable that she is divorced for 6 years.  She has 2 children and 4 grandchildren.  She had smoked for 15 years but quit smoking in 1998.  She completed 12th grade of education.  Family History:  The patient's family history includes Cancer in her sister; Diabetes  in her maternal grandfather; Healthy in her mother; Heart disease in her father and maternal grandfather; Hypertension in her father and maternal grandfather; Peripheral Artery Disease in her father; Thyroid disease in her sister.  Her mother is 75 years old.  Her father is 26 years old and has heart disease dating back to 65.  One sister age 34.  His Hodgkin's disease.  The other sister age 73 is stable.  She has 2 children.  ROS General: Negative; No fevers, chills, or night sweats;  HEENT: Negative; No changes in vision or hearing, sinus congestion, difficulty swallowing Pulmonary: Negative; No cough, wheezing, shortness of breath, hemoptysis Cardiovascular: see HPI GI: Negative; No nausea, vomiting, diarrhea, or abdominal pain GU: Negative; No dysuria, hematuria, or difficulty voiding Musculoskeletal: No back discomfort Hematologic/Oncology: Negative; no easy bruising, bleeding Endocrine: Negative; no heat/cold intolerance; no diabetes Neuro: Right leg numbness Skin: Negative; No rashes or skin lesions Psychiatric: Negative; No behavioral problems, depression Sleep: History of severe obstructive sleep apnea, on CPAP therapy for 20 years.  She received a new machine in 2015 following the sleep study that was done at the [facility].  Prior history of snoring, daytime sleepiness, nocturia; nobruxism, restless legs, hypnogognic hallucinations, no cataplexy Other comprehensive 14 point system review is negative.   PHYSICAL EXAM:   VS:  BP (!) 125/57   Pulse 63   Ht 5' 4"  (1.626 m)   Wt 212 lb 9.6 oz (96.4 kg)   SpO2 99%   BMI 36.49 kg/m     Repeat blood pressure by me was 120/70  Wt Readings from Last 3 Encounters:  06/14/20 212 lb 9.6 oz (96.4 kg)  03/07/20 222 lb 10.6 oz (101 kg)  03/03/20 203 lb (92.1 kg)    General: Alert, oriented,  no distress.  Skin: normal turgor, no rashes, warm and dry HEENT: Normocephalic, atraumatic. Pupils equal round and  reactive to light; sclera anicteric; extraocular muscles intact; Nose without nasal septal hypertrophy Mouth/Parynx benign; Mallinpatti scale 3 Neck: No JVD, no carotid bruits; normal carotid upstroke Lungs: clear to ausculatation and percussion; no wheezing or rales Chest wall: without tenderness to palpitation Heart: PMI not displaced, RRR, s1 s2 normal, 1/6 systolic murmur, no diastolic murmur, no rubs, gallops, thrills, or heaves Abdomen: soft, nontender; no hepatosplenomehaly, BS+; abdominal aorta nontender and not dilated by palpation. Back: no CVA tenderness Pulses 2+ Musculoskeletal: full range of motion, normal strength, no joint deformities Extremities: no clubbing cyanosis or edema, Homan's sign negative  Neurologic: grossly nonfocal; Cranial nerves grossly wnl Psychologic: Normal mood and affect   Studies/Labs Reviewed:   EKG:  EKG is ordered today.  ECG (independently read by me): Sinus rhythm at 63 bpm, PAC, normal intervals.  No significant ST changes.  July 2019 ECG (independently read by me): Normal sinus rhythm at 62 bpm.  Jan 28, 2018 ECG (independently read by me): Normal sinus rhythm at 89 bpm.  Nonspecific ST changes.  Normal intervals.  No ectopy.                                Recent Labs: BMP Latest Ref Rng & Units 11/19/2019 11/05/2018 08/07/2018  Glucose 65 - 99 mg/dL 290(H) 224(H) 254(H)  BUN 8 - 27 mg/dL 18 17 16   Creatinine 0.57 - 1.00 mg/dL 0.70 0.73 0.89  BUN/Creat Ratio 12 - 28 26 23 18   Sodium 134 - 144 mmol/L 136 136 140  Potassium 3.5 - 5.2 mmol/L 4.4 4.5 4.5  Chloride 96 - 106 mmol/L 100 97 99  CO2 20 - 29 mmol/L 24 23 25   Calcium 8.7 - 10.3 mg/dL 9.7 10.1 9.7     Hepatic Function Latest Ref Rng & Units 11/19/2019 11/05/2018 08/07/2018  Total Protein 6.0 - 8.5 g/dL 6.8 7.1 7.4  Albumin 3.8 - 4.8 g/dL 4.3 4.5 4.7  AST 0 - 40 IU/L 43(H) 46(H) 64(H)  ALT 0 - 32 IU/L 37(H) 39(H) 50(H)  Alk Phosphatase 39 - 117 IU/L 125(H) 83 74  Total Bilirubin  0.0 - 1.2 mg/dL 0.6 0.6 0.7    CBC Latest Ref Rng & Units 11/19/2019 01/20/2014  WBC 3.4 - 10.8 x10E3/uL 5.1 6.9  Hemoglobin 11.1 - 15.9 g/dL 12.9 13.0  Hematocrit 34.0 - 46.6 % 39.2 41.2  Platelets 150 - 450 x10E3/uL 190 -   Lab Results  Component Value Date   MCV 97 11/19/2019   MCV 98.8 (A) 01/20/2014   Lab Results  Component Value Date   TSH 0.021 (L) 11/19/2019   Lab Results  Component Value Date   HGBA1C 8.9 (A) 12/21/2019     BNP No results found for: BNP  ProBNP No results found for: PROBNP   Lipid Panel     Component Value Date/Time   CHOL 149 11/19/2019 1145   TRIG 246 (H) 11/19/2019 1145   HDL 35 (L) 11/19/2019 1145   CHOLHDL 4.3 11/19/2019 1145   CHOLHDL 5.2 (H) 08/14/2016 0921   VLDL 27 08/14/2016 0921   LDLCALC 74 11/19/2019 1145   LDLDIRECT 153 (H) 11/17/2017 1541     RADIOLOGY: No results found.   ASSESSMENT:    1. Essential hypertension   2. OSA on CPAP   3. Exertional dyspnea   4.  Morbid obesity (HCC)   5. Obesity, Class II, BMI 35-39.9   6. Type 2 diabetes mellitus with complication, without long-term current use of insulin (HCC)   7. Hypothyroidism, unspecified type      PLAN:  Sue Baker is a very pleasant 33 -year-old female who is a long-standing history of hypertension, hyperlipidemia, diabetes mellitus, hypothyroidism, depression, as well as obstructive sleep apnea.  She has been on CPAP therapy for over  20 years and received a new machine in 2015 after undergoing a subsequent sleep study at the [facility].  This sleep study was not in her current epic records.  We will have to contact the prior company's owner in [place] to see if they have a copy of this report.  However, she continues to use CPAP with 100% compliance.  She is in need for new supplies and her DME company is now [employer] to unfortunately was never given the records from her prior DME company.  She does have a 4 to 54-monthhistory of  noticing more shortness of breath with activity and that she tires quickly.  She had broken her right leg in October 2020 and was incapacitated without being able to exercise several months.  Also in June 2021 she had twisted her knee.  As result her activity level has been significantly less which may account for some of her shortness of breath with activity.  She denies associated chest pain.  I am recommending she undergo an echo Doppler study to reassess LV systolic and diastolic function.  I am also recommending her to undergo a routine treadmill test to assess exercise capacity and make certain there is no suggestion of potential ischemic changes.  We will try to contact [employer] so that they can link her CPAP device to our office for follow-up.  I will see her back in the office in 2 months for follow-up evaluation    Medication Adjustments/Labs and Tests Ordered: Current medicines are reviewed at length with the patient today.  Concerns regarding medicines are outlined above.  Medication changes, Labs and Tests ordered today are listed in the Patient Instructions below. Patient Instructions  Medication Instructions:  CONTINUE WITH CURRENT MEDICATIONS. NO CHANGES.  *If you need a refill on your cardiac medications before your next appointment, please call your pharmacy*    Testing/Procedures: Your physician has requested that you have an echocardiogram. Echocardiography is a painless test that uses sound waves to create images of your heart. It provides your doctor with information about the size and shape of your heart and how well your heart's chambers and valves are working. This procedure takes approximately one hour. There are no restrictions for this procedure.  Your physician has requested that you have an exercise tolerance test. For further information please visit [email or web address] Please also follow instruction sheet, as given.     Follow-Up: At [facility] you and your  health needs are our priority.  As part of our continuing mission to provide you with exceptional heart care, we have created designated Provider Care Teams.  These Care Teams include your primary Cardiologist (physician) and Advanced Practice Providers (APPs -  Physician Assistants and Nurse Practitioners) who all work together to provide you with the care you need, when you need it.  We recommend signing up for the patient portal called "MyChart".  Sign up information is provided on this After Visit Summary.  MyChart is used to connect with patients for Virtual Visits (Telemedicine).  Patients  are able to view lab/test results, encounter notes, upcoming appointments, etc.  Non-urgent messages can be sent to your provider as well.   To learn more about what you can do with MyChart, go to [email or web address].    Your next appointment:   2 month(s)  The format for your next appointment:   In Person  Provider:   Shelva Majestic, MD        Signed, Shelva Majestic, MD  06/15/2020 7:45 PM    [facility] [address] [place], [place], [place]  [address] Phone: [phone]

## 2020-06-15 ENCOUNTER — Encounter: Payer: Self-pay | Admitting: Cardiovascular Disease

## 2020-06-21 ENCOUNTER — Other Ambulatory Visit: Payer: Self-pay | Admitting: Cardiovascular Disease

## 2020-06-21 DIAGNOSIS — G4733 Obstructive sleep apnea (adult) (pediatric): Secondary | ICD-10-CM

## 2020-06-23 ENCOUNTER — Other Ambulatory Visit (HOSPITAL_COMMUNITY)
Admission: RE | Admit: 2020-06-23 | Discharge: 2020-06-23 | Disposition: A | Discharge status: Home / Self Care | Payer: 59 | Source: Ambulatory Visit | Attending: Cardiovascular Disease | Admitting: Cardiovascular Disease

## 2020-06-23 ENCOUNTER — Telehealth (HOSPITAL_COMMUNITY): Payer: Self-pay | Admitting: *Deleted

## 2020-06-23 DIAGNOSIS — Z20822 Contact with and (suspected) exposure to covid-19: Secondary | ICD-10-CM | POA: Diagnosis not present

## 2020-06-23 DIAGNOSIS — Z01812 Encounter for preprocedural laboratory examination: Secondary | ICD-10-CM | POA: Diagnosis not present

## 2020-06-23 LAB — SARS CORONAVIRUS 2 (TAT 6-24 HRS): SARS Coronavirus 2: NEGATIVE

## 2020-06-23 NOTE — Telephone Encounter (Signed)
Close encounter 

## 2020-06-26 ENCOUNTER — Ambulatory Visit (HOSPITAL_BASED_OUTPATIENT_CLINIC_OR_DEPARTMENT_OTHER): Payer: 59

## 2020-06-26 DIAGNOSIS — I10 Essential (primary) hypertension: Secondary | ICD-10-CM

## 2020-06-26 DIAGNOSIS — Z9989 Dependence on other enabling machines and devices: Secondary | ICD-10-CM

## 2020-06-26 DIAGNOSIS — G4733 Obstructive sleep apnea (adult) (pediatric): Secondary | ICD-10-CM

## 2020-06-26 LAB — ECHOCARDIOGRAM COMPLETE
Area-P 1/2: 2.82 cm2
S' Lateral: 2.2 cm

## 2020-06-26 MED ORDER — PERFLUTREN LIPID MICROSPHERE
1.0000 mL | INTRAVENOUS | Status: AC | PRN
  Administered 2020-06-26: 2 mL via INTRAVENOUS

## 2020-06-27 ENCOUNTER — Other Ambulatory Visit: Payer: Self-pay

## 2020-06-27 ENCOUNTER — Ambulatory Visit (HOSPITAL_COMMUNITY)
Admission: RE | Admit: 2020-06-27 | Discharge: 2020-06-27 | Disposition: A | Discharge status: Home / Self Care | Payer: 59 | Source: Ambulatory Visit | Attending: Cardiovascular Disease | Admitting: Cardiovascular Disease

## 2020-06-27 DIAGNOSIS — I10 Essential (primary) hypertension: Secondary | ICD-10-CM | POA: Insufficient documentation

## 2020-06-27 DIAGNOSIS — Z9989 Dependence on other enabling machines and devices: Secondary | ICD-10-CM | POA: Diagnosis not present

## 2020-06-27 DIAGNOSIS — G4733 Obstructive sleep apnea (adult) (pediatric): Secondary | ICD-10-CM | POA: Insufficient documentation

## 2020-06-27 LAB — EXERCISE TOLERANCE TEST
Estimated workload: 7 METS
Exercise duration (min): 5 min
Exercise duration (sec): 22 s
MPHR: 158 {beats}/min
Peak HR: 136 {beats}/min
Percent HR: 86 %
Rest HR: 57 {beats}/min

## 2020-06-29 ENCOUNTER — Other Ambulatory Visit: Payer: Self-pay | Admitting: Cardiovascular Disease

## 2020-06-29 ENCOUNTER — Telehealth: Payer: Self-pay | Admitting: *Deleted

## 2020-06-29 DIAGNOSIS — Z9989 Dependence on other enabling machines and devices: Secondary | ICD-10-CM

## 2020-06-29 DIAGNOSIS — I10 Essential (primary) hypertension: Secondary | ICD-10-CM

## 2020-06-29 NOTE — Telephone Encounter (Signed)
PA for in lab sleep study has been submitted to [facility] via web portal.

## 2020-07-03 ENCOUNTER — Telehealth: Payer: Self-pay | Admitting: *Deleted

## 2020-07-03 NOTE — Telephone Encounter (Signed)
Checked sleep PA status via [facility] web portal. Still pending.  Pending Auth [id number].

## 2020-07-07 ENCOUNTER — Other Ambulatory Visit: Payer: Self-pay

## 2020-07-07 DIAGNOSIS — R072 Precordial pain: Secondary | ICD-10-CM

## 2020-07-07 DIAGNOSIS — Z01812 Encounter for preprocedural laboratory examination: Secondary | ICD-10-CM

## 2020-07-07 MED ORDER — METOPROLOL TARTRATE 25 MG PO TABS: ORAL_TABLET | ORAL | 0 refills | Status: DC

## 2020-07-11 ENCOUNTER — Telehealth: Payer: Self-pay | Admitting: Cardiovascular Disease

## 2020-07-11 NOTE — Telephone Encounter (Signed)
No Precert required for HST for [facility]. LVM with patient to advise day and time for appt.

## 2020-07-24 ENCOUNTER — Ambulatory Visit: Payer: 59 | Admitting: Cardiovascular Disease

## 2020-07-24 ENCOUNTER — Other Ambulatory Visit: Payer: Self-pay

## 2020-07-24 ENCOUNTER — Encounter: Payer: Self-pay | Admitting: Cardiovascular Disease

## 2020-07-24 VITALS — BP 130/68 | HR 60 | Ht 64.0 in | Wt 210.8 lb

## 2020-07-24 DIAGNOSIS — R9439 Abnormal result of other cardiovascular function study: Secondary | ICD-10-CM

## 2020-07-24 DIAGNOSIS — E785 Hyperlipidemia, unspecified: Secondary | ICD-10-CM

## 2020-07-24 DIAGNOSIS — E039 Hypothyroidism, unspecified: Secondary | ICD-10-CM

## 2020-07-24 DIAGNOSIS — G4733 Obstructive sleep apnea (adult) (pediatric): Secondary | ICD-10-CM | POA: Diagnosis not present

## 2020-07-24 DIAGNOSIS — R0609 Other forms of dyspnea: Secondary | ICD-10-CM

## 2020-07-24 DIAGNOSIS — R0600 Dyspnea, unspecified: Secondary | ICD-10-CM | POA: Diagnosis not present

## 2020-07-24 DIAGNOSIS — I10 Essential (primary) hypertension: Secondary | ICD-10-CM

## 2020-07-24 DIAGNOSIS — E669 Obesity, unspecified: Secondary | ICD-10-CM

## 2020-07-24 DIAGNOSIS — Z9989 Dependence on other enabling machines and devices: Secondary | ICD-10-CM

## 2020-07-24 DIAGNOSIS — E66812 Obesity, class 2: Secondary | ICD-10-CM

## 2020-07-24 NOTE — Progress Notes (Signed)
Cardiology Office Note    Date:  07/31/2020   ID:  Sue Baker, DOB 24-Sep-1955, MRN [id number]  PCP:  Sue Contras, MD  Cardiologist:  Sue Majestic, MD   6 week F/U cardiology/sleep  evaluation, initially referred through the courtesy of Sue Baker  History of Present Illness:  Sue Baker is a 64 y.o. female who presents to the office today for a 2 month cardiology evaluation.    Sue Baker is originally from [place] and moved here with Sue Baker when she was transferred to the [place] area.  She has a long-standing history of hypertension, diabetes mellitus for at least 6 years, hyperlipidemia, and has a diagnosis of obstructive sleep apnea for almost 20 years.  She received a new CPAP machine in 2015.  She continues to work.  She has had difficulty with right leg numbness and also has noticed some shortness of breath with activity.  She denies any definitive lower extremity pain with walking but admits to numbness.   She has difficulty with knee discomfort.  She has noticed episodes of some stress mediated chest pressure but denies any clear-cut exertional precipitation.  Her last stress test was in [place], [place] at least 8 years ago.    She established care with me in May 2019 and I saw her for follow-up evaluation on March 23, 2008.  She underwent an echo Doppler study on Feb 11, 2018 which essentially was normal.  EF was 60 to 65%.  She had normal diastolic parameters.  There was no significant valvular abnormalities.  She has had issues with back pain and has seen a neurologist and is felt to have symptoms related to L5.  She denies any chest pain.  She is on CPAP for obstructive sleep apnea and uses [facility].    She has had some issues with varicose veins.  She underwent lower extremity arterial Doppler and had normal bilateral ankle-brachial indices.    Since I last evaluated her, she has continued to use CPAP therapy.  Her previous DME company was [employer] which  was purchased by [facility].  She is in need for new supplies.  She contacted [facility] who is now her DME provider but they have no records of her sleep apnea history.  For this reason she presented to the office today so that we can provide information to [facility] and ultimately have her link to our office for care.  She continues to work 12-hour shifts for [employer].  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  Over the past 4 to 5 months she has noticed shortness of breath with activity but denies associated chest tightness..  She is on olmesartan 20 mg for hypertension and is on rosuvastatin 40 mg and fenofibrate for hyperlipidemia.  She is on combination therapy with saxagliptin and Metformin for her diabetes mellitus and takes Lexapro for mild depression.    Apparently, I had seen her in 2015 and at that time she told me that she was diagnosed with sleep apnea approximately 15 years prior and recalled that she stopped breathing approximately 1 time per minute which correlates to an AHI of approximately 60.  She had been on CPAP therapy until 2015.  When I saw her, her CPAP machine had just broken and was nonfunctional.  She was unable to sleep without CPAP in her sleep quality was poor and she was snoring excessively.  Apparently she underwent a sleep evaluation at the [facility] and received a new ResMed air sense 10 machine.  I tried to obtain records of her sleep study at [facility] but this was not in the epic data since this was done prior to [place] being a part of [place].  However since obtaining a new machine she admits to 100% compliance.  When I saw her in September 2021, she was in need for new supplies.  Her prior DME company was bought out by [employer] and she was unable to attain supplies from them.  She had broken her right leg in October 2020 and was incapacitated for several months resulting in some weight gain.  She denied associated chest pain but  admitted to some shortness of breath with activity.  I recommended she undergo an echo Doppler study to reassess LV systolic and diastolic function.  I also recommended she undergo routine treadmill test to assess exercise capacity and make certain there was no suggestion of potential ischemic changes.  We were trying to have [facility] link her CPAP device to our office and I recommended follow-up in several months.  She underwent her echo Doppler study in June 26, 2020 which revealed normal systolic function with EF at 60 to 65% without wall motion abnormalities.  She had normal diastolic parameters.  There was mild dilation of the ascending aorta at 39 mm.  Her left atrium was moderately dilated.  A routine treadmill test was done on July 07, 2020.  She had a normal blood pressure response to exercise.  She was asymptomatic throughout the test but developed mild ST segment depression in the inferolateral leads of approximately 2 to 3 mm.  Presently she feels well and denies chest pain or shortness of breath.  She presents for follow-up evaluation.   Past Medical History:  Diagnosis Date   Allergy    Arthritis    Cataract    Diabetes mellitus without complication (HCC)    GERD (gastroesophageal reflux disease)    Hyperlipidemia    Hypertension    Sleep apnea    Thyroid disease     Past Surgical History:  Procedure Laterality Date   BREAST SURGERY     CARPAL TUNNEL RELEASE     COLONOSCOPY  03/03/2020   EYE SURGERY     KNEE SURGERY     left   TUBAL LIGATION      Current Medications: Outpatient Medications Prior to Visit  Medication Sig Dispense Refill   aspirin 81 MG tablet Take 81 mg by mouth daily.     blood glucose meter kit and supplies KIT Dispense based on patient and insurance preference. Use up to four times daily as directed. (FOR ICD-10 E11.9) 1 each 0   Continuous Blood Gluc Sensor (FREESTYLE LIBRE 2 SENSOR) MISC      cyclobenzaprine (FLEXERIL) 10 MG  tablet Take 10 mg by mouth 3 (three) times daily as needed for muscle spasms.     escitalopram (LEXAPRO) 10 MG tablet TAKE 1 TABLET BY MOUTH DAILY 90 tablet 3   esomeprazole (NEXIUM 24HR) 20 MG capsule Take 1 capsule (20 mg total) by mouth daily at 12 noon.     fenofibrate (TRICOR) 145 MG tablet Take 1 tablet (145 mg total) by mouth daily. 90 tablet 1   insulin degludec (TRESIBA FLEXTOUCH) 200 UNIT/ML FlexTouch Pen Inject into the skin daily. 20 ml     levothyroxine (SYNTHROID) 150 MCG tablet TAKE 1 TABLET BY MOUTH DAILY BEFORE BREAKFAST 90 tablet 1  meloxicam (MOBIC) 15 MG tablet TAKE 1 TABLET BY MOUTH EVERY DAY AS NEEDED FOR PAIN 90 tablet 0   metFORMIN (GLUCOPHAGE-XR) 500 MG 24 hr tablet Take 1,000 mg by mouth 2 (two) times daily.     Multiple Vitamins-Minerals (WOMENS MULTI VITAMIN & MINERAL) TABS Take by mouth.     olmesartan (BENICAR) 20 MG tablet TAKE 1 TABLET BY MOUTH EVERY DAY 90 tablet 0   ONETOUCH VERIO test strip USE AS DIRECTED 100 strip 5   rosuvastatin (CRESTOR) 40 MG tablet Take 1 tablet (40 mg total) by mouth daily. 90 tablet 1   traMADol (ULTRAM) 50 MG tablet Take 1-2 tablets (50-100 mg total) by mouth every 8 (eight) hours as needed. 30 tablet 0   metoprolol tartrate (LOPRESSOR) 25 MG tablet TAKE 1 TABLET 2 HR PRIOR TO CARDIAC PROCEDURE 1 tablet 0   Saxagliptin-Metformin (KOMBIGLYZE XR) 2.01-999 MG TB24 Take 2 tablets by mouth daily. 180 tablet 1   Facility-Administered Medications Prior to Visit  Medication Dose Route Frequency Provider Last Rate Last Admin   0.9 %  sodium chloride infusion  500 mL Intravenous Once Mansouraty, Telford Nab., MD         Allergies:   Trulicity [dulaglutide]   Social History   Socioeconomic History   Marital status: Divorced    Spouse name: Not on file   Number of children: Not on file   Years of education: 14   Highest education level: Some college, no degree  Occupational History    Employer: [employer]   Tobacco Use   Smoking status: Former Smoker    Quit date: 09/30/1999    Years since quitting: 20.8   Smokeless tobacco: Never Used  Vaping Use   Vaping Use: Never used  Substance and Sexual Activity   Alcohol use: Yes    Comment: rarely   Drug use: No   Sexual activity: Not Currently  Other Topics Concern   Not on file  Social History Narrative   Lives in a one story home.  Her daughter and grandson live with her.  Works at [employer]. 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  Education: some college.    Social Determinants of Health   Financial Resource Strain:    Difficulty of Paying Living Expenses: Not on file  Food Insecurity:    Worried About Charity fundraiser in the Last Year: Not on file   [employer] of Food in the Last Year: Not on file  Transportation Needs:    Lack of Transportation (Medical): Not on file   Lack of Transportation (Non-Medical): Not on file  Physical Activity:    Days of Exercise per Week: Not on file   Minutes of Exercise per Session: Not on file  Stress:    Feeling of Stress : Not on file  Social Connections:    Frequency of Communication with Friends and Family: Not on file   Frequency of Social Gatherings with Friends and Family: Not on file   Attends Religious Services: Not on file   Active Member of Clubs or Organizations: Not on file   Attends Archivist Meetings: Not on file   Marital Status: Not on file    Additional social history is notable that she is divorced for 6 years.  She has 2 children and 4 grandchildren.  She had smoked for 15 years but quit smoking in 1998.  She completed 12th grade of education.  Family History:  The patient's  family history includes Cancer in her sister; Diabetes in her maternal grandfather; Healthy in her mother; Heart disease in her father and maternal grandfather; Hypertension in her father and maternal grandfather; Peripheral Artery Disease in her father; Thyroid  disease in her sister.  Her mother is 3 years old.  Her father is 48 years old and has heart disease dating back to 68.  One sister age 56.  His Hodgkin's disease.  The other sister age 49 is stable.  She has 2 children.  ROS General: Negative; No fevers, chills, or night sweats;  HEENT: Negative; No changes in vision or hearing, sinus congestion, difficulty swallowing Pulmonary: Negative; No cough, wheezing, shortness of breath, hemoptysis Cardiovascular: see HPI GI: Negative; No nausea, vomiting, diarrhea, or abdominal pain GU: Negative; No dysuria, hematuria, or difficulty voiding Musculoskeletal: No back discomfort Hematologic/Oncology: Negative; no easy bruising, bleeding Endocrine: Negative; no heat/cold intolerance; no diabetes Neuro: Right leg numbness Skin: Negative; No rashes or skin lesions Psychiatric: Negative; No behavioral problems, depression Sleep: History of severe obstructive sleep apnea, on CPAP therapy for 20 years.  She received a new machine in 2015 following the sleep study that was done at the [facility] center.  Prior history of snoring, daytime sleepiness, nocturia; nobruxism, restless legs, hypnogognic hallucinations, no cataplexy Other comprehensive 14 point system review is negative.   PHYSICAL EXAM:   VS:  BP 130/68    Pulse 60    Ht 5' 4"  (1.626 m)    Wt 210 lb 12.8 oz (95.6 kg)    BMI 36.18 kg/m     Repeat blood pressure by me was 112/64  Wt Readings from Last 3 Encounters:  07/24/20 210 lb 12.8 oz (95.6 kg)  06/14/20 212 lb 9.6 oz (96.4 kg)  03/07/20 222 lb 10.6 oz (101 kg)    General: Alert, oriented, no distress.  Skin: normal turgor, no rashes, warm and dry HEENT: Normocephalic, atraumatic. Pupils equal round and reactive to light; sclera anicteric; extraocular muscles intact;  Nose without nasal septal hypertrophy Mouth/Parynx benign; Mallinpatti scale 3 Neck: No JVD, no carotid bruits; normal carotid upstroke Lungs: clear to  ausculatation and percussion; no wheezing or rales Chest wall: without tenderness to palpitation Heart: PMI not displaced, RRR, s1 s2 normal, 1/6 systolic murmur, no diastolic murmur, no rubs, gallops, thrills, or heaves Abdomen: soft, nontender; no hepatosplenomehaly, BS+; abdominal aorta nontender and not dilated by palpation. Back: no CVA tenderness Pulses 2+ Musculoskeletal: full range of motion, normal strength, no joint deformities Extremities: no clubbing cyanosis or edema, Homan's sign negative  Neurologic: grossly nonfocal; Cranial nerves grossly wnl Psychologic: Normal mood and affect   Studies/Labs Reviewed:   EKG:  EKG is ordered today.  ECG (independently read by me): NSR at 60 with mild sinus arrythmia    Se[tember  2021ECG (independently read by me): Sinus rhythm at 63 bpm, PAC, normal intervals.  No significant ST changes.  July 2019 ECG (independently read by me): Normal sinus rhythm at 62 bpm.  Jan 28, 2018 ECG (independently read by me): Normal sinus rhythm at 89 bpm.  Nonspecific ST changes.  Normal intervals.  No ectopy.                                Recent Labs: BMP Latest Ref Rng & Units 11/19/2019 11/05/2018 08/07/2018  Glucose 65 - 99 mg/dL 290(H) 224(H) 254(H)  BUN 8 - 27 mg/dL 18 17 16  Creatinine 0.57 - 1.00 mg/dL 0.70 0.73 0.89  BUN/Creat Ratio 12 - 28 26 23 18   Sodium 134 - 144 mmol/L 136 136 140  Potassium 3.5 - 5.2 mmol/L 4.4 4.5 4.5  Chloride 96 - 106 mmol/L 100 97 99  CO2 20 - 29 mmol/L 24 23 25   Calcium 8.7 - 10.3 mg/dL 9.7 10.1 9.7     Hepatic Function Latest Ref Rng & Units 11/19/2019 11/05/2018 08/07/2018  Total Protein 6.0 - 8.5 g/dL 6.8 7.1 7.4  Albumin 3.8 - 4.8 g/dL 4.3 4.5 4.7  AST 0 - 40 IU/L 43(H) 46(H) 64(H)  ALT 0 - 32 IU/L 37(H) 39(H) 50(H)  Alk Phosphatase 39 - 117 IU/L 125(H) 83 74  Total Bilirubin 0.0 - 1.2 mg/dL 0.6 0.6 0.7    CBC Latest Ref Rng & Units 11/19/2019 01/20/2014  WBC 3.4 - 10.8 x10E3/uL 5.1 6.9  Hemoglobin 11.1  - 15.9 g/dL 12.9 13.0  Hematocrit 34.0 - 46.6 % 39.2 41.2  Platelets 150 - 450 x10E3/uL 190 -   Lab Results  Component Value Date   MCV 97 11/19/2019   MCV 98.8 (A) 01/20/2014   Lab Results  Component Value Date   TSH 0.021 (L) 11/19/2019   Lab Results  Component Value Date   HGBA1C 8.9 (A) 12/21/2019     BNP No results found for: BNP  ProBNP No results found for: PROBNP   Lipid Panel     Component Value Date/Time   CHOL 149 11/19/2019 1145   TRIG 246 (H) 11/19/2019 1145   HDL 35 (L) 11/19/2019 1145   CHOLHDL 4.3 11/19/2019 1145   CHOLHDL 5.2 (H) 08/14/2016 0921   VLDL 27 08/14/2016 0921   LDLCALC 74 11/19/2019 1145   LDLDIRECT 153 (H) 11/17/2017 1541     RADIOLOGY: No results found.   ASSESSMENT:    1. OSA on CPAP   2. Exertional dyspnea   3. Essential hypertension   4. Abnormal stress electrocardiogram test using treadmill   5. Hyperlipidemia LDL goal <70   6. Hypothyroidism, unspecified type   7. Obesity, Class II, BMI 35-39.9     PLAN:  Sue Baker is a very pleasant 64 -year-old female who is a long-standing history of hypertension, hyperlipidemia, diabetes mellitus, hypothyroidism, depression, as well as obstructive sleep apnea.  She has been on CPAP therapy for over 20 years and received a new machine in 2015 after undergoing a subsequent sleep study at the [facility].  Unfortunately, the sleep study is not in the epic records.  The patient had not been successful in discussing with [facility] her prior data.  Unfortunately [facility] home care had the prior data in this unfortunately was never transferred to [facility].  I have asked our sleep coordinator to try to contact the prior owners of the [facility] sleep center in [place] to see if this data can be supplied.  Otherwise [facility] has necessitated a follow-up sleep study.  Sue Baker tells me she has a home test scheduled for November 18 for verification of her sleep  apnea.  She has been using CPAP for over 20 years.  If the home test is to be done this obviously will need to be done off CPAP therapy to prove use.  However, after the patient left, I was able to get her CPAP linked to our office by [facility].  Usage from October 2 through October 31 was excellent with 100% use.  Average use duration was 7 hours  and 4 minutes per night.  Her CPAP is set at a 15 cm water pressure and her AHI is excellent at 1.7.  She does have some mask leak.  Her blood pressure today is stable on her regimen of olmesartan 20 mg.  She continues to be on levothyroxine 150 mcg for hypothyroidism.  I reviewed her echo Doppler study which shows normal systolic as well as diastolic function.  Her routine treadmill test, however revealed asymptomatic ST segment depression of 2 to 3 mm inferolaterally.  As result, I will schedule her for coronary CTA for further evaluation of potential coronary artery disease.  She continues to be on fenofibrate and rosuvastatin for her mixed hyperlipidemia.  She is diabetic on Tresiba FlexTouch insulin and Metformin.  She has moderate obesity with BMI of 36.2.  Weight loss was recommended.  I will see her in several months for reevaluation or sooner as needed.   Medication Adjustments/Labs and Tests Ordered: Current medicines are reviewed at length with the patient today.  Concerns regarding medicines are outlined above.  Medication changes, Labs and Tests ordered today are listed in the Patient Instructions below. Patient Instructions  Medication Instructions:  Your physician recommends that you continue on your current medications as directed. Please refer to the Current Medication list given to you today.  Testing/Procedures: CTA to be scheduled  Follow-Up: At [facility], you and your health needs are our priority.  As part of our continuing mission to provide you with exceptional heart care, we have created designated Provider Care Teams.  These Care  Teams include your primary Cardiologist (physician) and Advanced Practice Providers (APPs -  Physician Assistants and Nurse Practitioners) who all work together to provide you with the care you need, when you need it.  We recommend signing up for the patient portal called "MyChart".  Sign up information is provided on this After Visit Summary.  MyChart is used to connect with patients for Virtual Visits (Telemedicine).  Patients are able to view lab/test results, encounter notes, upcoming appointments, etc.  Non-urgent messages can be sent to your provider as well.   To learn more about what you can do with MyChart, go to [email or web address].    Your next appointment:   January 4th with Dr. Claiborne Billings    Signed, Sue Majestic, MD  07/31/2020 12:52 PM    [facility] [address], [place], [place], [place]  [address] Phone: [phone]

## 2020-07-24 NOTE — Patient Instructions (Addendum)
Medication Instructions:  Your physician recommends that you continue on your current medications as directed. Please refer to the Current Medication list given to you today.  Testing/Procedures: CTA to be scheduled  Follow-Up: At [facility], you and your health needs are our priority.  As part of our continuing mission to provide you with exceptional heart care, we have created designated Provider Care Teams.  These Care Teams include your primary Cardiologist (physician) and Advanced Practice Providers (APPs -  Physician Assistants and Nurse Practitioners) who all work together to provide you with the care you need, when you need it.  We recommend signing up for the patient portal called "MyChart".  Sign up information is provided on this After Visit Summary.  MyChart is used to connect with patients for Virtual Visits (Telemedicine).  Patients are able to view lab/test results, encounter notes, upcoming appointments, etc.  Non-urgent messages can be sent to your provider as well.   To learn more about what you can do with MyChart, go to [email or web address].    Your next appointment:   January 4th with Dr. Claiborne Billings

## 2020-07-27 ENCOUNTER — Ambulatory Visit: Payer: 59 | Admitting: General Practice

## 2020-07-31 ENCOUNTER — Encounter: Payer: Self-pay | Admitting: Cardiovascular Disease

## 2020-08-10 ENCOUNTER — Other Ambulatory Visit: Payer: Self-pay

## 2020-08-10 ENCOUNTER — Ambulatory Visit (HOSPITAL_BASED_OUTPATIENT_CLINIC_OR_DEPARTMENT_OTHER): Payer: 59 | Attending: Cardiovascular Disease | Admitting: Cardiovascular Disease

## 2020-08-10 DIAGNOSIS — I10 Essential (primary) hypertension: Secondary | ICD-10-CM | POA: Diagnosis not present

## 2020-08-10 DIAGNOSIS — Z9989 Dependence on other enabling machines and devices: Secondary | ICD-10-CM | POA: Insufficient documentation

## 2020-08-10 DIAGNOSIS — Z79899 Other long term (current) drug therapy: Secondary | ICD-10-CM | POA: Diagnosis not present

## 2020-08-10 DIAGNOSIS — Z791 Long term (current) use of non-steroidal anti-inflammatories (NSAID): Secondary | ICD-10-CM | POA: Insufficient documentation

## 2020-08-10 DIAGNOSIS — G4733 Obstructive sleep apnea (adult) (pediatric): Secondary | ICD-10-CM | POA: Insufficient documentation

## 2020-08-10 DIAGNOSIS — R0902 Hypoxemia: Secondary | ICD-10-CM | POA: Diagnosis not present

## 2020-08-10 DIAGNOSIS — Z7982 Long term (current) use of aspirin: Secondary | ICD-10-CM | POA: Insufficient documentation

## 2020-08-20 ENCOUNTER — Encounter (HOSPITAL_BASED_OUTPATIENT_CLINIC_OR_DEPARTMENT_OTHER): Payer: Self-pay | Admitting: Cardiovascular Disease

## 2020-08-20 ENCOUNTER — Telehealth: Payer: Self-pay | Admitting: *Deleted

## 2020-08-20 DIAGNOSIS — G4733 Obstructive sleep apnea (adult) (pediatric): Secondary | ICD-10-CM

## 2020-08-20 NOTE — Procedures (Signed)
     Patient Name: Sue Baker, Sue Baker Date: 08/11/2020 Gender: Female D.O.B: [birth year] Age (years): 7 Referring Provider: Shelva Majestic MD, ABSM Height (inches): 64 Interpreting Physician: Shelva Majestic MD, ABSM Weight (lbs): 212 RPSGT: Gerhard Perches BMI: 36 MRN: [id number] Neck Size: 16.50  CLINICAL INFORMATION Sleep Study Type: HST  Indication for sleep study: reconfirmation of sleep apnea; patient is on CPAP therapy but DME provider ([facility]) needs reconfirmation for her to receive supplies through insurance. Patient has been on CPAP for 20 years and received a new machine in 2015.  Epworth Sleepiness Score: 22  SLEEP STUDY TECHNIQUE A multi-channel overnight portable sleep study was performed. The channels recorded were: nasal airflow, thoracic respiratory movement, and oxygen saturation with a pulse oximetry. Snoring was also monitored.  MEDICATIONS aspirin 81 MG tablet blood glucose meter kit and supplies KIT Continuous Blood Gluc Sensor (FREESTYLE LIBRE 2 SENSOR) MISC cyclobenzaprine (FLEXERIL) 10 MG tablet escitalopram (LEXAPRO) 10 MG tablet esomeprazole (NEXIUM 24HR) 20 MG capsule fenofibrate (TRICOR) 145 MG tablet insulin degludec (TRESIBA FLEXTOUCH) 200 UNIT/ML FlexTouch Pen levothyroxine (SYNTHROID) 150 MCG tablet meloxicam (MOBIC) 15 MG tablet metFORMIN (GLUCOPHAGE-XR) 500 MG 24 hr tablet metoprolol tartrate (LOPRESSOR) 25 MG tablet Multiple Vitamins-Minerals (WOMENS MULTI VITAMIN & MINERAL) TABS olmesartan (BENICAR) 20 MG tablet ONETOUCH VERIO test strip rosuvastatin (CRESTOR) 40 MG tablet traMADol (ULTRAM) 50 MG tablet Patient self administered medications include: N/A.  SLEEP ARCHITECTURE Patient was studied for 454.7 minutes. The sleep efficiency was 100.0 % and the patient was supine for 49.5%. The arousal index was 0.0 per hour.  RESPIRATORY PARAMETERS The overall AHI was 39.1 per hour, with a central apnea index of 0.0 per hour. The  severity during REM sleep cannot be assessed on this home study.  The oxygen nadir was 75% during sleep.  CARDIAC DATA Mean heart rate during sleep was 60.1 bpm.  IMPRESSIONS - Severe obstructive sleep apnea occurred during this study (AHI 39.1/h). - No significant central sleep apnea occurred during this study (CAI = 0.0/h). - Severe oxygen desaturation to a nadir of 75%. - Patient snored 4.4% during the sleep.  DIAGNOSIS - Obstructive Sleep Apnea (G47.33) - Nocturnal Hypoxemia (G47.36)  RECOMMENDATIONS - The patient received her last machine in 2015 which currently is set at 15 cm water pressure. If she is in need of a new machine recommend a ResMed AirSence 10 Auto unit. - Positional therapy avoiding supine position during sleep. - Avoid alcohol, sedatives and other CNS depressants that may worsen sleep apnea and disrupt normal sleep architecture. - Sleep hygiene should be reviewed to assess factors that may improve sleep quality. - Weight management and regular exercise should be initiated or continued. - Return to Sleep Center to discuss the results of this study   [Electronically signed] 08/20/2020 06:19 PM  Shelva Majestic MD, [facility], ABSM Diplomate, American Board of Sleep Medicine   NPI: [id number] [place] PH: [phone]   FX: [phone] [place]

## 2020-08-20 NOTE — Telephone Encounter (Addendum)
Informed patient of sleep study results and patient understanding was verbalized. Patient understands her sleep study showed  IMPRESSIONS - Severe obstructive sleep apnea occurred during this study (AHI 39.1/h). - Severe oxygen desaturation to a nadir of 75%. - Patient snored 4.4% during the sleep.  DIAGNOSIS - Obstructive Sleep Apnea (G47.33) - Nocturnal Hypoxemia (G47.36)  RECOMMENDATIONS - The patient received her last machine in 2015 which currently is set at 15 cm water pressure. If she is in need of a new machine recommend a ResMed AirSence 10 Auto unit. - Positional therapy avoiding supine position during sleep.  Upon patient request DME selection is [facility]. Patient understands she/he will be contacted by [facility] to set up her/he cpap. Patient understands to call if [facility] does not contact her/he with new setup in a timely manner. Patient understands they will be called once confirmation has been received from [facility] that they have received their new machine to schedule 10 week follow up appointment.   [facility] notified of new cpap order  Please add to airview Patient was grateful for the call and thanked me. PER DPR Left detailed message on voicemail and informed patient to call back with questions

## 2020-09-11 ENCOUNTER — Other Ambulatory Visit: Payer: Self-pay

## 2020-09-11 DIAGNOSIS — Z01812 Encounter for preprocedural laboratory examination: Secondary | ICD-10-CM

## 2020-09-11 LAB — BASIC METABOLIC PANEL
BUN/Creatinine Ratio: 29 — ABNORMAL HIGH (ref 12–28)
BUN: 20 mg/dL (ref 8–27)
CO2: 24 mmol/L (ref 20–29)
Calcium: 9.5 mg/dL (ref 8.7–10.3)
Chloride: 102 mmol/L (ref 96–106)
Creatinine, Ser: 0.7 mg/dL (ref 0.57–1.00)
GFR calc Af Amer: 107 mL/min/{1.73_m2} (ref >59)
GFR calc non Af Amer: 93 mL/min/{1.73_m2} (ref >59)
Glucose: 107 mg/dL — ABNORMAL HIGH (ref 65–99)
Potassium: 4.3 mmol/L (ref 3.5–5.2)
Sodium: 139 mmol/L (ref 134–144)

## 2020-09-13 ENCOUNTER — Telehealth (HOSPITAL_COMMUNITY): Payer: Self-pay | Admitting: *Deleted

## 2020-09-13 NOTE — Telephone Encounter (Signed)
Attempted to call patient regarding upcoming cardiac CT appointment. °Left message on voicemail with name and callback number ° °Rommie Dunn Tai RN Navigator Cardiac Imaging °[facility] °[phone] Office °[phone] Cell ° °

## 2020-09-18 ENCOUNTER — Other Ambulatory Visit: Payer: Self-pay

## 2020-09-18 ENCOUNTER — Encounter: Payer: 59 | Admitting: *Deleted

## 2020-09-18 ENCOUNTER — Ambulatory Visit (HOSPITAL_COMMUNITY)
Admission: RE | Admit: 2020-09-18 | Discharge: 2020-09-18 | Disposition: A | Discharge status: Home / Self Care | Payer: 59 | Source: Ambulatory Visit | Attending: Cardiovascular Disease | Admitting: Cardiovascular Disease

## 2020-09-18 DIAGNOSIS — R072 Precordial pain: Secondary | ICD-10-CM

## 2020-09-18 DIAGNOSIS — Z006 Encounter for examination for normal comparison and control in clinical research program: Secondary | ICD-10-CM

## 2020-09-18 MED ORDER — IOHEXOL 350 MG/ML SOLN
80.0000 mL | Freq: Once | INTRAVENOUS | Status: AC | PRN
  Administered 2020-09-18: 80 mL via INTRAVENOUS

## 2020-09-18 MED ORDER — NITROGLYCERIN 0.4 MG SL SUBL: 0.8000 mg | SUBLINGUAL_TABLET | Freq: Once | SUBLINGUAL | Status: AC

## 2020-09-18 MED ORDER — NITROGLYCERIN 0.4 MG SL SUBL
SUBLINGUAL_TABLET | SUBLINGUAL | Status: AC
  Administered 2020-09-18: 17:00:00 0.8 mg via SUBLINGUAL
  Filled 2020-09-18: qty 2

## 2020-09-18 NOTE — Research (Signed)
Subject Name: Sue Baker  Subject met inclusion and exclusion criteria.  The informed consent form, study requirements and expectations were reviewed with the subject and questions and concerns were addressed prior to the signing of the consent form.  The subject verbalized understanding of the trial requirements.  The subject agreed to participate in the Identify trial and signed the informed consent at 1600 on 09/18/20  The informed consent was obtained prior to performance of any protocol-specific procedures for the subject.  A copy of the signed informed consent was given to the subject and a copy was placed in the subject's medical record.   [name]

## 2020-09-19 DIAGNOSIS — R072 Precordial pain: Secondary | ICD-10-CM | POA: Diagnosis not present

## 2020-09-26 ENCOUNTER — Encounter: Payer: Self-pay | Admitting: Cardiovascular Disease

## 2020-09-26 ENCOUNTER — Telehealth: Payer: Self-pay

## 2020-09-26 ENCOUNTER — Ambulatory Visit: Payer: 59 | Admitting: Cardiovascular Disease

## 2020-09-26 ENCOUNTER — Other Ambulatory Visit: Payer: Self-pay

## 2020-09-26 DIAGNOSIS — Z9989 Dependence on other enabling machines and devices: Secondary | ICD-10-CM

## 2020-09-26 DIAGNOSIS — I208 Other forms of angina pectoris: Secondary | ICD-10-CM

## 2020-09-26 DIAGNOSIS — R931 Abnormal findings on diagnostic imaging of heart and coronary circulation: Secondary | ICD-10-CM | POA: Diagnosis not present

## 2020-09-26 DIAGNOSIS — G4733 Obstructive sleep apnea (adult) (pediatric): Secondary | ICD-10-CM | POA: Diagnosis not present

## 2020-09-26 DIAGNOSIS — I10 Essential (primary) hypertension: Secondary | ICD-10-CM | POA: Diagnosis not present

## 2020-09-26 DIAGNOSIS — Z01812 Encounter for preprocedural laboratory examination: Secondary | ICD-10-CM

## 2020-09-26 DIAGNOSIS — E785 Hyperlipidemia, unspecified: Secondary | ICD-10-CM

## 2020-09-26 DIAGNOSIS — E039 Hypothyroidism, unspecified: Secondary | ICD-10-CM

## 2020-09-26 DIAGNOSIS — R0609 Other forms of dyspnea: Secondary | ICD-10-CM

## 2020-09-26 DIAGNOSIS — R0600 Dyspnea, unspecified: Secondary | ICD-10-CM

## 2020-09-26 MED ORDER — METOPROLOL SUCCINATE ER 25 MG PO TB24: 12.5000 mg | ORAL_TABLET | Freq: Every day | ORAL | 3 refills | Status: DC

## 2020-09-26 MED ORDER — ISOSORBIDE MONONITRATE ER 30 MG PO TB24: 30.0000 mg | ORAL_TABLET | Freq: Every day | ORAL | 3 refills | Status: DC

## 2020-09-26 NOTE — Telephone Encounter (Signed)
Called to inform pt that time of pre-procedure Covid swab is noted incorrectly on her After Visit Summary. Appointment is Friday, January 28 at 2:45 p.m. (AVS has since been corrected but originally said 1:40). Left message for pt to call back. Will also reach out via [employer].

## 2020-09-26 NOTE — Progress Notes (Signed)
Cardiology Office Note    Date:  09/28/2020   ID:  Sue Baker, DOB [birth year], MRN [id number]  PCP:  Antony Contras, MD  Cardiologist:  Shelva Majestic, MD    F/U cardiology/sleep  evaluation, initially referred through the courtesy of Dr. Delman Cheadle  History of Present Illness:  Sue Baker is a 65 y.o. female who presents to the office today for a 2 month cardiology/sleep evaluation.    [name] is originally from [place] and moved here with Sue Baker when she was transferred to the [place] area.  She has a long-standing history of hypertension, diabetes mellitus for at least 6 years, hyperlipidemia, and has a diagnosis of obstructive sleep apnea for almost 20 years.  She received a new CPAP machine in 2015.  She continues to work.  She has had difficulty with right leg numbness and also has noticed some shortness of breath with activity.  She denies any definitive lower extremity pain with walking but admits to numbness.   She has difficulty with knee discomfort.  She has noticed episodes of some stress mediated chest pressure but denies any clear-cut exertional precipitation.  Her last stress test was in [place], [place] at least 8 years ago.    She established care with me in May 2019 and I saw her for follow-up evaluation on March 23, 2008.  She underwent an echo Doppler study on Feb 11, 2018 which essentially was normal.  EF was 60 to 65%.  She had normal diastolic parameters.  There was no significant valvular abnormalities.  She has had issues with back pain and has seen a neurologist and is felt to have symptoms related to L5.  She denies any chest pain.  She is on CPAP for obstructive sleep apnea and uses [facility].    She has had some issues with varicose veins.  She underwent lower extremity arterial Doppler and had normal bilateral ankle-brachial indices.    Since I last evaluated her, she has continued to use CPAP therapy.  Her previous DME company was [employer] which  was purchased by [employer].  She is in need for new supplies.  She contacted [employer] who is now her DME provider but they have no records of her sleep apnea history.  For this reason she presented to the office today so that we can provide information to [employer] and ultimately have her link to our office for care.  She continues to work 12-hour shifts for [employer].  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  Over the past 4 to 5 months she has noticed shortness of breath with activity but denies associated chest tightness..  She is on olmesartan 20 mg for hypertension and is on rosuvastatin 40 mg and fenofibrate for hyperlipidemia.  She is on combination therapy with saxagliptin and Metformin for her diabetes mellitus and takes Lexapro for mild depression.    Apparently, I had seen her in 2015 and at that time she told me that she was diagnosed with sleep apnea approximately 15 years prior and recalled that she stopped breathing approximately 1 time per minute which correlates to an AHI of approximately 60.  She had been on CPAP therapy until 2015.  When I saw her, her CPAP machine had just broken and was nonfunctional.  She was unable to sleep without CPAP in her sleep quality was poor and she was snoring excessively.  Apparently she underwent a sleep evaluation at the [facility] and received a new ResMed air sense 10 machine.  I tried to obtain records of her sleep study at [facility] but this was not in the epic data since this was done prior to [place] being a part of [place].  However since obtaining a new machine she admits to 100% compliance.  When I saw her in September 2021, she was in need for new supplies.  Her prior DME company was bought out by [employer] and she was unable to obtain supplies from them.  She had broken her right leg in October 2020 and was incapacitated for several months resulting in some weight gain.  She denied associated chest pain but  admitted to some shortness of breath with activity.  I recommended she undergo an echo Doppler study to reassess LV systolic and diastolic function.  I also recommended she undergo routine treadmill test to assess exercise capacity and make certain there was no suggestion of potential ischemic changes.  We were trying to have [employer] link her CPAP device to our office and I recommended follow-up in several months.  She underwent her echo Doppler study in June 26, 2020 which revealed normal systolic function with EF at 60 to 65% without wall motion abnormalities.  She had normal diastolic parameters.  There was mild dilation of the ascending aorta at 39 mm.  Her left atrium was moderately dilated.  A routine treadmill test was done on July 07, 2020.  She had a normal blood pressure response to exercise.  She was asymptomatic throughout the test but developed mild ST segment depression in the inferolateral leads of approximately 2 to 3 mm.  Presently she feels well and denies chest pain or shortness of breath.    When I last saw her on July 24, 2020 we had to schedule her for a home sleep study by her DME company since they do not have any of her prior data and needed this so that she could obtain supplies.  In addition, because of her abnormal treadmill test I recommended she undergo coronary CTA for further evaluation of potential coronary artery disease.  She underwent a home sleep study on August 11, 2020 which confirmed severe sleep apnea with an AHI of 39.1.  O2 nadir was significant at 75% during sleep.  She has not yet been set up with her ResMed air sense 10 AutoSet unit.  She underwent coronary CTA on September 18, 2020 which showed significantly elevated calcium score at 1931 Agatston units, placing her in the 90th percentile for age and gender suggesting a high risk for future cardiac events.  She was felt to have possible 50% distal left main stenosis.  There was heavy calcification of the  proximal and mid LAD but severe stenosis cannot be excluded due to blooming artifact from the extensive calcification making quantification difficult.  He also has heavy calcification of the proximal and mid ramus with possible moderate stenosis.  He subsequently underwent FFR analysis with FFR significant at 0.73 in the distal LAD.  The left main did not appear to be hemodynamically significant.  Ramus FFR could not be analyzed.  She admits to exertional chest tightness as well as dyspnea.  She continues to work in the yard intermittently.  She denies any rest angina.  She presents for evaluation.  Past Medical History:  Diagnosis Date  . Allergy   . Arthritis   . Cataract   . Diabetes mellitus without complication ([place])   . GERD (  gastroesophageal reflux disease)   . Hyperlipidemia   . Hypertension   . Sleep apnea   . Thyroid disease     Past Surgical History:  Procedure Laterality Date  . BREAST SURGERY    . CARPAL TUNNEL RELEASE    . COLONOSCOPY  03/03/2020  . EYE SURGERY    . KNEE SURGERY     left  . TUBAL LIGATION      Current Medications: Outpatient Medications Prior to Visit  Medication Sig Dispense Refill  . aspirin 81 MG tablet Take 81 mg by mouth daily.    . blood glucose meter kit and supplies KIT Dispense based on patient and insurance preference. Use up to four times daily as directed. (FOR ICD-10 E11.9) 1 each 0  . BYDUREON BCISE 2 MG/0.85ML AUIJ Inject into the skin.    . Continuous Blood Gluc Sensor (FREESTYLE LIBRE 2 SENSOR) MISC     . cyclobenzaprine (FLEXERIL) 10 MG tablet Take 10 mg by mouth 3 (three) times daily as needed for muscle spasms.    Marland Kitchen escitalopram (LEXAPRO) 10 MG tablet TAKE 1 TABLET BY MOUTH DAILY 90 tablet 3  . esomeprazole (NEXIUM 24HR) 20 MG capsule Take 1 capsule (20 mg total) by mouth daily at 12 noon.    . fenofibrate (TRICOR) 145 MG tablet Take 1 tablet (145 mg total) by mouth daily. 90 tablet 1  . insulin glargine (LANTUS SOLOSTAR) 100  UNIT/ML Solostar Pen 15 units    . JARDIANCE 25 MG TABS tablet Take 25 mg by mouth daily.    Marland Kitchen levothyroxine (SYNTHROID) 150 MCG tablet TAKE 1 TABLET BY MOUTH DAILY BEFORE BREAKFAST 90 tablet 1  . meloxicam (MOBIC) 15 MG tablet TAKE 1 TABLET BY MOUTH EVERY DAY AS NEEDED FOR PAIN 90 tablet 0  . metFORMIN (GLUCOPHAGE-XR) 500 MG 24 hr tablet Take 1,000 mg by mouth 2 (two) times daily.    . Multiple Vitamins-Minerals (WOMENS MULTI VITAMIN & MINERAL) TABS Take by mouth.    . olmesartan (BENICAR) 20 MG tablet TAKE 1 TABLET BY MOUTH EVERY DAY 90 tablet 0  . ONETOUCH VERIO test strip USE AS DIRECTED 100 strip 5  . rosuvastatin (CRESTOR) 40 MG tablet Take 1 tablet (40 mg total) by mouth daily. 90 tablet 1  . traMADol (ULTRAM) 50 MG tablet Take 1-2 tablets (50-100 mg total) by mouth every 8 (eight) hours as needed. 30 tablet 0  . insulin degludec (TRESIBA FLEXTOUCH) 200 UNIT/ML FlexTouch Pen Inject into the skin daily. 20 ml    . metoprolol tartrate (LOPRESSOR) 25 MG tablet TAKE 1 TABLET 2 HR PRIOR TO CARDIAC PROCEDURE 1 tablet 0   Facility-Administered Medications Prior to Visit  Medication Dose Route Frequency Provider Last Rate Last Admin  . 0.9 %  sodium chloride infusion  500 mL Intravenous Once Mansouraty, Telford Nab., MD         Allergies:   Trulicity [dulaglutide]   Social History   Socioeconomic History  . Marital status: Divorced    Spouse name: Not on file  . Number of children: Not on file  . Years of education: 37  . Highest education level: Some college, no degree  Occupational History    Employer: [employer]  Tobacco Use  . Smoking status: Former Smoker    Quit date: 09/30/1999    Years since quitting: 21.0  . Smokeless tobacco: Never Used  Vaping Use  . Vaping Use: Never used  Substance and Sexual Activity  . Alcohol use: Yes  Comment: rarely  . Drug use: No  . Sexual activity: Not Currently  Other Topics Concern  . Not on file  Social History Narrative    Lives in a one story home.  Her daughter and grandson live with her.  Works at [employer]. 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  Education: some college.    Social Determinants of Health   Financial Resource Strain: Not on file  Food Insecurity: Not on file  Transportation Needs: Not on file  Physical Activity: Not on file  Stress: Not on file  Social Connections: Not on file    Additional social history is notable that she is divorced for 6 years.  She has 2 children and 4 grandchildren.  She had smoked for 15 years but quit smoking in 1998.  She completed 12th grade of education.  Family History:  The patient's family history includes Cancer in her sister; Diabetes in her maternal grandfather; Healthy in her mother; Heart disease in her father and maternal grandfather; Hypertension in her father and maternal grandfather; Peripheral Artery Disease in her father; Thyroid disease in her sister.  Her mother is 50 years old.  Her father is 63 years old and has heart disease dating back to 69.  One sister age 53.  His Hodgkin's disease.  The other sister age 18 is stable.  She has 2 children.  ROS General: Negative; No fevers, chills, or night sweats;  HEENT: Negative; No changes in vision or hearing, sinus congestion, difficulty swallowing Pulmonary: Negative; No cough, wheezing, shortness of breath, hemoptysis Cardiovascular: see HPI GI: Negative; No nausea, vomiting, diarrhea, or abdominal pain GU: Negative; No dysuria, hematuria, or difficulty voiding Musculoskeletal: No back discomfort Hematologic/Oncology: Negative; no easy bruising, bleeding Endocrine: Negative; no heat/cold intolerance; no diabetes Neuro: Right leg numbness Skin: Negative; No rashes or skin lesions Psychiatric: Negative; No behavioral problems, depression Sleep: History of severe obstructive sleep apnea, on CPAP therapy for 20 years.  She received a new machine in 2015 following the sleep study that  was done at the [facility].  Prior history of snoring, daytime sleepiness, nocturia; nobruxism, restless legs, hypnogognic hallucinations, no cataplexy Other comprehensive 14 point system review is negative.   PHYSICAL EXAM:   VS:  BP 124/62   Pulse 60   Ht 5' 4"  (1.626 m)   Wt 202 lb 9.6 oz (91.9 kg)   BMI 34.78 kg/m    Repeat blood pressure by me was 122/66  Wt Readings from Last 3 Encounters:  09/26/20 202 lb 9.6 oz (91.9 kg)  08/10/20 212 lb (96.2 kg)  07/24/20 210 lb 12.8 oz (95.6 kg)    General: Alert, oriented, no distress.  Skin: normal turgor, no rashes, warm and dry HEENT: Normocephalic, atraumatic. Pupils equal round and reactive to light; sclera anicteric; extraocular muscles intact;  Nose without nasal septal hypertrophy Mouth/Parynx benign; Mallinpatti scale 3 Neck: No JVD, no carotid bruits; normal carotid upstroke Lungs: clear to ausculatation and percussion; no wheezing or rales Chest wall: without tenderness to palpitation Heart: PMI not displaced, RRR, s1 s2 normal, 1/6 systolic murmur, no diastolic murmur, no rubs, gallops, thrills, or heaves Abdomen: soft, nontender; no hepatosplenomehaly, BS+; abdominal aorta nontender and not dilated by palpation. Back: no CVA tenderness Pulses 2+ Musculoskeletal: full range of motion, normal strength, no joint deformities Extremities: no clubbing cyanosis or edema, Homan's sign negative  Neurologic: grossly nonfocal; Cranial nerves grossly wnl Psychologic: Normal mood and affect   Studies/Labs  Reviewed:   EKG:  EKG is ordered today.  ECG (independently read by me): NSR with mild sinus arryrthmia, QTc 420   July 24, 2020 ECG (independently read by me): NSR at 60 with mild sinus arrythmia   Se[tember  2021ECG (independently read by me): Sinus rhythm at 63 bpm, PAC, normal intervals.  No significant ST changes.  July 2019 ECG (independently read by me): Normal sinus rhythm at 62 bpm.  Jan 28, 2018 ECG (independently read by me): Normal sinus rhythm at 89 bpm.  Nonspecific ST changes.  Normal intervals.  No ectopy.                                Recent Labs: BMP Latest Ref Rng & Units 09/11/2020 11/19/2019 11/05/2018  Glucose 65 - 99 mg/dL 107(H) 290(H) 224(H)  BUN 8 - 27 mg/dL 20 18 17   Creatinine 0.57 - 1.00 mg/dL 0.70 0.70 0.73  BUN/Creat Ratio 12 - 28 29(H) 26 23  Sodium 134 - 144 mmol/L 139 136 136  Potassium 3.5 - 5.2 mmol/L 4.3 4.4 4.5  Chloride 96 - 106 mmol/L 102 100 97  CO2 20 - 29 mmol/L 24 24 23   Calcium 8.7 - 10.3 mg/dL 9.5 9.7 10.1     Hepatic Function Latest Ref Rng & Units 11/19/2019 11/05/2018 08/07/2018  Total Protein 6.0 - 8.5 g/dL 6.8 7.1 7.4  Albumin 3.8 - 4.8 g/dL 4.3 4.5 4.7  AST 0 - 40 IU/L 43(H) 46(H) 64(H)  ALT 0 - 32 IU/L 37(H) 39(H) 50(H)  Alk Phosphatase 39 - 117 IU/L 125(H) 83 74  Total Bilirubin 0.0 - 1.2 mg/dL 0.6 0.6 0.7    CBC Latest Ref Rng & Units 11/19/2019 01/20/2014  WBC 3.4 - 10.8 x10E3/uL 5.1 6.9  Hemoglobin 11.1 - 15.9 g/dL 12.9 13.0  Hematocrit 34.0 - 46.6 % 39.2 41.2  Platelets 150 - 450 x10E3/uL 190 -   Lab Results  Component Value Date   MCV 97 11/19/2019   MCV 98.8 (A) 01/20/2014   Lab Results  Component Value Date   TSH 0.021 (L) 11/19/2019   Lab Results  Component Value Date   HGBA1C 8.9 (A) 12/21/2019     BNP No results found for: BNP  ProBNP No results found for: PROBNP   Lipid Panel     Component Value Date/Time   CHOL 149 11/19/2019 1145   TRIG 246 (H) 11/19/2019 1145   HDL 35 (L) 11/19/2019 1145   CHOLHDL 4.3 11/19/2019 1145   CHOLHDL 5.2 (H) 08/14/2016 0921   VLDL 27 08/14/2016 0921   LDLCALC 74 11/19/2019 1145   LDLDIRECT 153 (H) 11/17/2017 1541     RADIOLOGY: CT CORONARY MORPH W/CTA COR W/SCORE W/CA W/CM &/OR WO/CM  Addendum Date: 09/18/2020   ADDENDUM REPORT: 09/18/2020 17:21 CLINICAL DATA:  Chest pain EXAM: Cardiac CTA MEDICATIONS: Sub lingual nitro. 106m x 2 TECHNIQUE: The patient  was scanned on a Siemens 1782slice scanner. Gantry rotation speed was 250 msecs. Collimation was 0.6 mm. A 100 kV prospective scan was triggered in the ascending thoracic aorta at 35-75% of the R-R interval. Average HR during the scan was 60 bpm. The 3D data set was interpreted on a dedicated work station using MPR, MIP and VRT modes. A total of 80cc of contrast was used. FINDINGS: [facility]-cardiac: [facility] The pulmonary veins drain normally to the left atrium. No LA appendage thrombus seen.  Calcium Score: 1931 Agatston units. Coronary Arteries: Right dominant with no anomalies LM: Distal mixed plaque with approximately 50% stenosis. LAD system: Heavily calcified proximal and mid LAD. Cannot rule out severe stenosis (70-90%) stenosis in the mid LAD. Circumflex system: Medium-sized ramus with calcified plaque in the proximal to mid vessel. Cannot rule out moderate (51-69%) stenosis. The AV LCx has ostial calcified plaque, suspect mild (<50%) stenosis. The remainder of the LCx is relatively small, no obstructive disease. RCA system: Calcified plaque throughout the proximal, mid and distal RCA. Suspect no more than mild (<50%) stenosis. IMPRESSION: 1. Coronary calcium score 1931 Agatston units. This places the patient in the 99th percentile for age and gender, suggesting high risk for future cardiac events. 2.  Distal left main with possible up to 50% stenosis. 3. Heavy calcification proximal and mid LAD, cannot rule out severe stenosis (blooming artifact from extensive calcification makes quantification difficult). 4. Heavy calcification proximal and mid ramus, possible moderate stenosis. Will send for FFR. Dalton Mclean Electronically Signed   By: Loralie Champagne M.D.   On: 09/18/2020 17:21   Result Date: 09/18/2020 EXAM: OVER-READ INTERPRETATION  CT CHEST The following report is an over-read performed by radiologist Dr. Vinnie Langton of [facility], [place] on 09/18/2020.  This over-read does not include interpretation of cardiac or coronary anatomy or pathology. The coronary calcium score/coronary CTA interpretation by the cardiologist is attached. COMPARISON:  None. FINDINGS: Aortic atherosclerosis. Within the visualized portions of the thorax there are no suspicious appearing pulmonary nodules or masses, there is no acute consolidative airspace disease, no pleural effusions, no pneumothorax and no lymphadenopathy. Visualized portions of the upper abdomen are unremarkable. There are no aggressive appearing lytic or blastic lesions noted in the visualized portions of the skeleton. IMPRESSION: 1.  Aortic Atherosclerosis (ICD10-I70.0). Electronically Signed: By: Vinnie Langton M.D. On: 09/18/2020 17:01   CT CORONARY FRACTIONAL FLOW RESERVE DATA PREP  Result Date: 09/19/2020 CLINICAL DATA:  Chest pain EXAM: CT FFR MEDICATIONS: No additional medications. TECHNIQUE: The coronary CTA was sent for FFR. FINDINGS: FFR 0.79 mid LAD and 0.73 distal LAD FFR 0.82 distal LCx FFR 0.80 distal RCA IMPRESSION: 1.  Left main disease does not appear hemodynamically significant. 2. There is extensive calcified plaque in the proximal to mid LAD. The FFR down to 0.73 in the distal LAD suggests hemodynamic significance. 3.  Unable to analyze the ramus by CT FFR. Dalton Mclean Electronically Signed   By: Loralie Champagne M.D.   On: 09/19/2020 17:21    08/10/2020 CLINICAL INFORMATION Sleep Study Type: HST  Indication for sleep study: reconfirmation of sleep apnea; patient is on CPAP therapy but DME provider ([facility]) needs reconfirmation for her to receive supplies through insurance. Patient has been on CPAP for 20 years and received a new machine in 2015.  Epworth Sleepiness Score: 22  SLEEP STUDY TECHNIQUE A multi-channel overnight portable sleep study was performed. The channels recorded were: nasal airflow, thoracic respiratory movement, and oxygen saturation with a pulse oximetry.  Snoring was also monitored.  MEDICATIONS aspirin 81 MG tablet blood glucose meter kit and supplies KIT Continuous Blood Gluc Sensor (FREESTYLE LIBRE 2 SENSOR) MISC cyclobenzaprine (FLEXERIL) 10 MG tablet escitalopram (LEXAPRO) 10 MG tablet esomeprazole (NEXIUM 24HR) 20 MG capsule fenofibrate (TRICOR) 145 MG tablet insulin degludec (TRESIBA FLEXTOUCH) 200 UNIT/ML FlexTouch Pen levothyroxine (SYNTHROID) 150 MCG tablet meloxicam (MOBIC) 15 MG tablet metFORMIN (GLUCOPHAGE-XR) 500 MG 24 hr tablet metoprolol tartrate (LOPRESSOR) 25 MG tablet Multiple Vitamins-Minerals (WOMENS MULTI VITAMIN & MINERAL)  TABS olmesartan (BENICAR) 20 MG tablet ONETOUCH VERIO test strip rosuvastatin (CRESTOR) 40 MG tablet traMADol (ULTRAM) 50 MG tablet Patient self administered medications include: N/A.  SLEEP ARCHITECTURE Patient was studied for 454.7 minutes. The sleep efficiency was 100.0 % and the patient was supine for 49.5%. The arousal index was 0.0 per hour.  RESPIRATORY PARAMETERS The overall AHI was 39.1 per hour, with a central apnea index of 0.0 per hour. The severity during REM sleep cannot be assessed on this home study.  The oxygen nadir was 75% during sleep.  CARDIAC DATA Mean heart rate during sleep was 60.1 bpm.  IMPRESSIONS - Severe obstructive sleep apnea occurred during this study (AHI 39.1/h). - No significant central sleep apnea occurred during this study (CAI = 0.0/h). - Severe oxygen desaturation to a nadir of 75%. - Patient snored 4.4% during the sleep.  DIAGNOSIS - Obstructive Sleep Apnea (G47.33) - Nocturnal Hypoxemia (G47.36)  RECOMMENDATIONS - The patient received her last machine in 2015 which currently is set at 15 cm water pressure. If she is in need of a new machine recommend a ResMed AirSence 10 Auto unit. - Positional therapy avoiding supine position during sleep. - Avoid alcohol, sedatives and other CNS depressants that may worsen sleep apnea and disrupt  normal sleep architecture. - Sleep hygiene should be reviewed to assess factors that may improve sleep quality. - Weight management and regular exercise should be initiated or continued. - Return to Sleep Center to discuss the results of this study   ASSESSMENT:    1. Abnormal cardiac CT angiography   2. Exertional angina (HCC)   3. OSA on CPAP   4. Essential hypertension   5. Exertional dyspnea   6. Hyperlipidemia LDL goal <70   7. Hypothyroidism, unspecified type   8. Pre-procedure lab exam     PLAN:  Ms. Marlita Keil is a very pleasant 35 -year-old female who is a long-standing history of hypertension, hyperlipidemia, diabetes mellitus, hypothyroidism, depression, as well as obstructive sleep apnea.  She has been on CPAP therapy for over 20 years and received a new machine in 2015 after undergoing a subsequent sleep study at the [facility].  Unfortunately, the sleep study is not in the epic records.   Unfortunately [facility] had her prior sleep data in this unfortunately was never transferred to [place].  She was required to undergo a repeat sleep evaluation for documentation of her sleep apnea and this was recently completed on August 10, 2020 again confirming severe sleep apnea with a overall AHI on a home study of 39.1 and significant oxygen desaturation to a nadir of 75%.  Apparently she has not yet received her new machine and has been using her old unit.  We will set her up for an ResMed air sense 10 auto unit.  Her previous machine had been set at 15 cm of water.  When she receives a new machine we will set her up with a range of 12 to 20 cm with EPR of 3.  I also have recommended she have a ResMed AirFit F 30i mask which I believe she will do well with.  I had an extensive discussion with her today and reviewed her coronary CTA which was done after she had had an abnormal routine treadmill test.  She was found to have severe coronary calcification with a  calcium score of 1931 agonist on units, representing 99th percentile for age and gender suggesting high risk for future cardiac  events.  I reviewed her CTA findings with her in detail as well as the FFR analysis.  She has experienced exertional chest tightness as well as exertional dyspnea.  I am adding isosorbide 30 mg daily.  Her resting pulse is 60.  I will initially attempt low-dose metoprolol succinate at 12.5 mg for beta-blocker therapy.  I have recommended definitive cardiac catheterization. I have reviewed the risks, indications, and alternatives to cardiac catheterization, possible angioplasty, and stenting with the patient. Risks include but are not limited to bleeding, infection, vascular injury, stroke, myocardial infection, arrhythmia, kidney injury, radiation-related injury in the case of prolonged fluoroscopy use, emergency cardiac surgery, and death. The patient understands the risks of serious complication is 1-2 in 6967 with diagnostic cardiac cath and 1-2% or less with angioplasty/stenting.  With her significant coronary calcification, she may ultimately require atherectomy prior to stenting and this was also discussed with her and if so this would most likely be a staged procedure.  I have recommended she hold olmesartan the day of her catheterization as well as metformin.  She is now on rosuvastatin 40 mg and fenofibrate 145 mg with her mixed hyperlipidemia for aggressive lipid intervention with a target LDL less than 70.  Her blood pressure today is controlled on olmesartan 20 mg.  She is on levothyroxine 150 mcg for hypothyroidism.  She is diabetic on Jardiance and metformin.  She continues to take Lexapro.  I will schedule her for her diagnostic catheterization which was done on October 24, 2020.  Laboratory and precath COVID testing will be necessary prior to her study.   Medication Adjustments/Labs and Tests Ordered: Current medicines are reviewed at length with the patient today.   Concerns regarding medicines are outlined above.  Medication changes, Labs and Tests ordered today are listed in the Patient Instructions below. Patient Instructions  Medication Instructions:  Begin taking metoprolol 12.5 mg (half a tablet) once daily. Begin taking isosorbide mononitrate 30 mg once a day. *If you need a refill on your cardiac medications before your next appointment, please call your pharmacy*   Lab Work: BMET and CBC 1 week before your catheterization If you have labs (blood work) drawn today and your tests are completely normal, you will receive your results only by: Marland Kitchen MyChart Message (if you have MyChart) OR . A paper copy in the mail If you have any lab test that is abnormal or we need to change your treatment, we will call you to review the results.   Testing/Procedures:    [place] [address] Dept: [phone] Loc: [place]  09/26/2020  You are scheduled for a Cardiac Catheterization on Tuesday, February 1 with Dr. Shelva Majestic.  1. Please arrive at the [facility] (Main Entrance A) at [facility]: [address] [place], [place] [address] at 5:30 AM (This time is two hours before your procedure to ensure your preparation). Free valet parking service is available.   Special note: Every effort is made to have your procedure done on time. Please understand that emergencies sometimes delay scheduled procedures.  2. Diet: Do not eat solid foods after midnight.  The patient may have clear liquids until 5am upon the day of the procedure.  3. Labs: You will need to have blood drawn a week before your procedure (CBC and BMET). You do not need to be fasting.  You will need to have the coronavirus  test completed prior to your procedure. An appointment has been made at 2:45 p.m. on  Friday, October 20, 2020. This is a Drive Up  Visit at the [address] [place]. Please tell them that you are there for pre-procedure testing. Someone will direct you to the appropriate testing line. Stay in your car and someone will be with you shortly. Please make sure to have all other labs completed before this test because you will need to stay quarantined until your procedure. Please take your insurance card to this test.    4. Medication instructions in preparation for your procedure:  Take only a half dose of your long-acting insulin the night before your procedure. Do not take any insulin the day of your procedure.  Do not take oral diabetes medicine the day of your procedure. Do not take metformin the day of your procedure and hold it for 48 hours after your procedure.  Do not take your olmesartan the morning of your procedure.  On the morning of your procedure, take your Aspirin and any morning medicines NOT listed above.  You may use sips of water.  5. Plan for one night stay--bring personal belongings. 6. Bring a current list of your medications and current insurance cards. 7. You MUST have a responsible person to drive you home. 8. Someone MUST be with you the first 24 hours after you arrive home or your discharge will be delayed. 9. Please wear clothes that are easy to get on and off and wear slip-on shoes.  Thank you for allowing [place] to care for you!   -- [place] [facility]    Follow-Up: At [facility], you and your health needs are our priority.  As part of our continuing mission to provide you with exceptional heart care, we have created designated Provider Care Teams.  These Care Teams include your primary Cardiologist (physician) and Advanced Practice Providers (APPs -  Physician Assistants and Nurse Practitioners) who all work together to provide you with the care you need, when you need it.  We recommend signing up for the patient portal called "MyChart".  Sign up  information is provided on this After Visit Summary.  MyChart is used to connect with patients for Virtual Visits (Telemedicine).  Patients are able to view lab/test results, encounter notes, upcoming appointments, etc.  Non-urgent messages can be sent to your provider as well.   To learn more about what you can do with MyChart, go to [email or web address].    Your next appointment:   We will determine your follow up date at the time of your procedure.   Other Instructions We will reach out to your DME company to help update your device and equipment for sleep apnea.          Signed, Shelva Majestic, MD  09/28/2020 12:43 PM    [place] [address] [place], [place], [place]  [address] Phone: [phone]

## 2020-09-26 NOTE — Patient Instructions (Addendum)
Medication Instructions:  Begin taking metoprolol 12.5 mg (half a tablet) once daily. Begin taking isosorbide mononitrate 30 mg once a day. *If you need a refill on your cardiac medications before your next appointment, please call your pharmacy*   Lab Work: BMET and CBC 1 week before your catheterization If you have labs (blood work) drawn today and your tests are completely normal, you will receive your results only by: Marland Kitchen MyChart Message (if you have MyChart) OR . A paper copy in the mail If you have any lab test that is abnormal or we need to change your treatment, we will call you to review the results.   Testing/Procedures:    [facility] CARDIOVASCULAR DIVISION [facility] [address] [place] [address] [place] [address] Dept: [phone] Loc: [phone]  [place]  09/26/2020  You are scheduled for a Cardiac Catheterization on Tuesday, February 1 with Dr. Nicki [place].  1. Please arrive at the [facility] (Main Entrance A) at [facility]: [address] [place], [place] [address] at 5:30 AM (This time is two hours before your procedure to ensure your preparation). Free valet parking service is available.   Special note: Every effort is made to have your procedure done on time. Please understand that emergencies sometimes delay scheduled procedures.  2. Diet: Do not eat solid foods after midnight.  The patient may have clear liquids until 5am upon the day of the procedure.  3. Labs: You will need to have blood drawn a week before your procedure (CBC and BMET). You do not need to be fasting.  You will need to have the coronavirus test completed prior to your procedure. An appointment has been made at 2:45 p.m. on  Friday, October 20, 2020. This is a Drive Up Visit at the [address] [place]. Please tell them that you are there for pre-procedure testing. Someone will direct you to the appropriate testing line.  Stay in your car and someone will be with you shortly. Please make sure to have all other labs completed before this test because you will need to stay quarantined until your procedure. Please take your insurance card to this test.    4. Medication instructions in preparation for your procedure:  Take only a half dose of your long-acting insulin the night before your procedure. Do not take any insulin the day of your procedure.  Do not take oral diabetes medicine the day of your procedure. Do not take metformin the day of your procedure and hold it for 48 hours after your procedure.  Do not take your olmesartan the morning of your procedure.  On the morning of your procedure, take your Aspirin and any morning medicines NOT listed above.  You may use sips of water.  5. Plan for one night stay--bring personal belongings. 6. Bring a current list of your medications and current insurance cards. 7. You MUST have a responsible person to drive you home. 8. Someone MUST be with you the first 24 hours after you arrive home or your discharge will be delayed. 9. Please wear clothes that are easy to get on and off and wear slip-on shoes.  Thank you for allowing [place] to care for you!   -- [place] [facility]    Follow-Up: At [facility], you and your health needs are our priority.  As part of our continuing mission to provide you with exceptional heart care, we have created designated Provider  Care Teams.  These Care Teams include your primary Cardiologist (physician) and Advanced Practice Providers (APPs -  Physician Assistants and Nurse Practitioners) who all work together to provide you with the care you need, when you need it.  We recommend signing up for the patient portal called "MyChart".  Sign up information is provided on this After Visit Summary.  MyChart is used to connect with patients for Virtual Visits (Telemedicine).  Patients are able to view lab/test results,  encounter notes, upcoming appointments, etc.  Non-urgent messages can be sent to your provider as well.   To learn more about what you can do with MyChart, go to [email or web address].    Your next appointment:   We will determine your follow up date at the time of your procedure.   Other Instructions We will reach out to your DME company to help update your device and equipment for sleep apnea.

## 2020-09-26 NOTE — H&P (View-Only) (Signed)
Cardiology Office Note    Date:  09/28/2020   ID:  Sue Baker, DOB 1956-07-11, MRN [id number]  PCP:  Sue Contras, MD  Cardiologist:  Sue Majestic, MD    F/U cardiology/Sue  evaluation, initially referred through the courtesy of Dr. Delman Baker  History of Present Illness:  Sue Baker is a 65 y.o. female who presents to the office today for a 2 month cardiology/Sue evaluation.    Sue Baker is originally from [place] and moved here with [facility] when she was transferred to the [place] area.  She has a long-standing history of hypertension, diabetes mellitus for at least 6 years, hyperlipidemia, and has a diagnosis of obstructive Sue apnea for almost 20 years.  She received a new CPAP machine in 2015.  She continues to work.  She has had difficulty with right leg numbness and also has noticed some shortness of breath with activity.  She denies any definitive lower extremity pain with walking but admits to numbness.   She has difficulty with knee discomfort.  She has noticed episodes of some stress mediated chest pressure but denies any clear-cut exertional precipitation.  Her last stress test was in [place], [place] at least 8 years ago.    She established care with me in May 2019 and I saw her for follow-up evaluation on March 23, 2008.  She underwent an echo Doppler study on Feb 11, 2018 which essentially was normal.  EF was 60 to 65%.  She had normal diastolic parameters.  There was no significant valvular abnormalities.  She has had issues with back pain and has seen a neurologist and is felt to have symptoms related to L5.  She denies any chest pain.  She is on CPAP for obstructive Sue apnea and uses [facility].    She has had some issues with varicose veins.  She underwent lower extremity arterial Doppler and had normal bilateral ankle-brachial indices.    Since I last evaluated her, she has continued to use CPAP therapy.  Her previous DME company was [employer] which  was purchased by [employer].  She is in need for new supplies.  She contacted [employer] who is now her DME provider but they have no records of her Sue apnea history.  For this reason she presented to the office today so that we can provide information to [employer] and ultimately have her link to our office for care.  She continues to work 12-hour shifts for [employer].  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  Over the past 4 to 5 months she has noticed shortness of breath with activity but denies associated chest tightness..  She is on olmesartan 20 mg for hypertension and is on rosuvastatin 40 mg and fenofibrate for hyperlipidemia.  She is on combination therapy with saxagliptin and Metformin for her diabetes mellitus and takes Sue Baker for mild depression.    Apparently, I had seen her in 2015 and at that time she told me that she was diagnosed with Sue apnea approximately 15 years prior and recalled that she stopped breathing approximately 1 time per minute which correlates to an AHI of approximately 60.  She had been on CPAP therapy until 2015.  When I saw her, her CPAP machine had just broken and was nonfunctional.  She was unable to Sue without CPAP in her Sue quality was poor and she was snoring excessively.  Apparently she underwent a Sue evaluation at the [facility] and received a new ResMed air sense 10 machine.  I tried to obtain records of her Sue study at [facility] but this was not in the epic data since this was done prior to [place] being a part of [facility].  However since obtaining a new machine she admits to 100% compliance.  When I saw her in September 2021, she was in need for new supplies.  Her prior DME company was bought out by [employer] and she was unable to obtain supplies from them.  She had broken her right leg in October 2020 and was incapacitated for several months resulting in some weight gain.  She denied associated chest pain but  admitted to some shortness of breath with activity.  I recommended she undergo an echo Doppler study to reassess LV systolic and diastolic function.  I also recommended she undergo routine treadmill test to assess exercise capacity and make certain there was no suggestion of potential ischemic changes.  We were trying to have [employer] link her CPAP device to our office and I recommended follow-up in several months.  She underwent her echo Doppler study in June 26, 2020 which revealed normal systolic function with EF at 60 to 65% without wall motion abnormalities.  She had normal diastolic parameters.  There was mild dilation of the ascending aorta at 39 mm.  Her left atrium was moderately dilated.  A routine treadmill test was done on July 07, 2020.  She had a normal blood pressure response to exercise.  She was asymptomatic throughout the test but developed mild ST segment depression in the inferolateral leads of approximately 2 to 3 mm.  Presently she feels well and denies chest pain or shortness of breath.    When I last saw her on July 24, 2020 we had to schedule her for a home Sue study by her DME company since they do not have any of her prior data and needed this so that she could obtain supplies.  In addition, because of her abnormal treadmill test I recommended she undergo coronary CTA for further evaluation of potential coronary artery disease.  She underwent a home Sue study on August 11, 2020 which confirmed severe Sue apnea with an AHI of 39.1.  O2 nadir was significant at 75% during Sue.  She has not yet been set up with her ResMed air sense 10 AutoSet unit.  She underwent coronary CTA on September 18, 2020 which showed significantly elevated calcium score at 1931 Agatston units, placing her in the 90th percentile for age and gender suggesting a high risk for future cardiac events.  She was felt to have possible 50% distal left main stenosis.  There was heavy calcification of the  proximal and mid LAD but severe stenosis cannot be excluded due to blooming artifact from the extensive calcification making quantification difficult.  He also has heavy calcification of the proximal and mid ramus with possible moderate stenosis.  He subsequently underwent FFR analysis with FFR significant at 0.73 in the distal LAD.  The left main did not appear to be hemodynamically significant.  Ramus FFR could not be analyzed.  She admits to exertional chest tightness as well as dyspnea.  She continues to work in the yard intermittently.  She denies any rest angina.  She presents for evaluation.  Past Medical History:  Diagnosis Date  . Allergy   . Arthritis   . Cataract   . Diabetes mellitus without complication ([place])   . GERD (  gastroesophageal reflux disease)   . Hyperlipidemia   . Hypertension   . Sue apnea   . Thyroid disease     Past Surgical History:  Procedure Laterality Date  . BREAST SURGERY    . CARPAL TUNNEL RELEASE    . COLONOSCOPY  03/03/2020  . EYE SURGERY    . KNEE SURGERY     left  . TUBAL LIGATION      Current Medications: Outpatient Medications Prior to Visit  Medication Sig Dispense Refill  . aspirin 81 MG tablet Take 81 mg by mouth daily.    . blood glucose meter kit and supplies KIT Dispense based on patient and insurance preference. Use up to four times daily as directed. (FOR ICD-10 E11.9) 1 each 0  . BYDUREON BCISE 2 MG/0.85ML AUIJ Inject into the skin.    . Continuous Blood Gluc Sensor (FREESTYLE LIBRE 2 SENSOR) MISC     . cyclobenzaprine (FLEXERIL) 10 MG tablet Take 10 mg by mouth 3 (three) times daily as needed for muscle spasms.    Marland Kitchen escitalopram (Sue Baker) 10 MG tablet TAKE 1 TABLET BY MOUTH DAILY 90 tablet 3  . esomeprazole (NEXIUM 24HR) 20 MG capsule Take 1 capsule (20 mg total) by mouth daily at 12 noon.    . fenofibrate (TRICOR) 145 MG tablet Take 1 tablet (145 mg total) by mouth daily. 90 tablet 1  . insulin glargine (LANTUS SOLOSTAR) 100  UNIT/ML Solostar Pen 15 units    . JARDIANCE 25 MG TABS tablet Take 25 mg by mouth daily.    Marland Kitchen levothyroxine (SYNTHROID) 150 MCG tablet TAKE 1 TABLET BY MOUTH DAILY BEFORE BREAKFAST 90 tablet 1  . meloxicam (MOBIC) 15 MG tablet TAKE 1 TABLET BY MOUTH EVERY DAY AS NEEDED FOR PAIN 90 tablet 0  . metFORMIN (GLUCOPHAGE-XR) 500 MG 24 hr tablet Take 1,000 mg by mouth 2 (two) times daily.    . Multiple Vitamins-Minerals (WOMENS MULTI VITAMIN & MINERAL) TABS Take by mouth.    . olmesartan (BENICAR) 20 MG tablet TAKE 1 TABLET BY MOUTH EVERY DAY 90 tablet 0  . ONETOUCH VERIO test strip USE AS DIRECTED 100 strip 5  . rosuvastatin (CRESTOR) 40 MG tablet Take 1 tablet (40 mg total) by mouth daily. 90 tablet 1  . traMADol (ULTRAM) 50 MG tablet Take 1-2 tablets (50-100 mg total) by mouth every 8 (eight) hours as needed. 30 tablet 0  . insulin degludec (TRESIBA FLEXTOUCH) 200 UNIT/ML FlexTouch Pen Inject into the skin daily. 20 ml    . metoprolol tartrate (LOPRESSOR) 25 MG tablet TAKE 1 TABLET 2 HR PRIOR TO CARDIAC PROCEDURE 1 tablet 0   Facility-Administered Medications Prior to Visit  Medication Dose Route Frequency Provider Last Rate Last Admin  . 0.9 %  sodium chloride infusion  500 mL Intravenous Once Mansouraty, Telford Nab., MD         Allergies:   Trulicity [dulaglutide]   Social History   Socioeconomic History  . Marital status: Divorced    Spouse name: Not on file  . Number of children: Not on file  . Years of education: 84  . Highest education level: Some college, no degree  Occupational History    Employer: [employer]  Tobacco Use  . Smoking status: Former Smoker    Quit date: 09/30/1999    Years since quitting: 21.0  . Smokeless tobacco: Never Used  Vaping Use  . Vaping Use: Never used  Substance and Sexual Activity  . Alcohol use: Yes  Comment: rarely  . Drug use: No  . Sexual activity: Not Currently  Other Topics Concern  . Not on file  Social History Narrative    Lives in a one story home.  Her daughter and grandson live with her.  Works at [employer]. 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  Education: some college.    Social Determinants of Health   Financial Resource Strain: Not on file  Food Insecurity: Not on file  Transportation Needs: Not on file  Physical Activity: Not on file  Stress: Not on file  Social Connections: Not on file    Additional social history is notable that she is divorced for 6 years.  She has 2 children and 4 grandchildren.  She had smoked for 15 years but quit smoking in 1998.  She completed 12th grade of education.  Family History:  The patient's family history includes Cancer in her sister; Diabetes in her maternal grandfather; Healthy in her mother; Heart disease in her father and maternal grandfather; Hypertension in her father and maternal grandfather; Peripheral Artery Disease in her father; Thyroid disease in her sister.  Her mother is 23 years old.  Her father is 68 years old and has heart disease dating back to 21.  One sister age 4.  His Hodgkin's disease.  The other sister age 85 is stable.  She has 2 children.  ROS General: Negative; No fevers, chills, or night sweats;  HEENT: Negative; No changes in vision or hearing, sinus congestion, difficulty swallowing Pulmonary: Negative; No cough, wheezing, shortness of breath, hemoptysis Cardiovascular: see HPI GI: Negative; No nausea, vomiting, diarrhea, or abdominal pain GU: Negative; No dysuria, hematuria, or difficulty voiding Musculoskeletal: No back discomfort Hematologic/Oncology: Negative; no easy bruising, bleeding Endocrine: Negative; no heat/cold intolerance; no diabetes Neuro: Right leg numbness Skin: Negative; No rashes or skin lesions Psychiatric: Negative; No behavioral problems, depression Sue: History of severe obstructive Sue apnea, on CPAP therapy for 20 years.  She received a new machine in 2015 following the Sue study that  was done at the [facility] and [facility].  Prior history of snoring, daytime sleepiness, nocturia; nobruxism, restless legs, hypnogognic hallucinations, no cataplexy Other comprehensive 14 point system review is negative.   PHYSICAL EXAM:   VS:  BP 124/62   Pulse 60   Ht 5' 4"  (1.626 m)   Wt 202 lb 9.6 oz (91.9 kg)   BMI 34.78 kg/m    Repeat blood pressure by me was 122/66  Wt Readings from Last 3 Encounters:  09/26/20 202 lb 9.6 oz (91.9 kg)  08/10/20 212 lb (96.2 kg)  07/24/20 210 lb 12.8 oz (95.6 kg)    General: Alert, oriented, no distress.  Skin: normal turgor, no rashes, warm and dry HEENT: Normocephalic, atraumatic. Pupils equal round and reactive to light; sclera anicteric; extraocular muscles intact;  Nose without nasal septal hypertrophy Mouth/Parynx benign; Mallinpatti scale 3 Neck: No JVD, no carotid bruits; normal carotid upstroke Lungs: clear to ausculatation and percussion; no wheezing or rales Chest wall: without tenderness to palpitation Heart: PMI not displaced, RRR, s1 s2 normal, 1/6 systolic murmur, no diastolic murmur, no rubs, gallops, thrills, or heaves Abdomen: soft, nontender; no hepatosplenomehaly, BS+; abdominal aorta nontender and not dilated by palpation. Back: no CVA tenderness Pulses 2+ Musculoskeletal: full range of motion, normal strength, no joint deformities Extremities: no clubbing cyanosis or edema, Homan's sign negative  Neurologic: grossly nonfocal; Cranial nerves grossly wnl Psychologic: Normal mood and affect   Studies/Labs  Reviewed:   EKG:  EKG is ordered today.  ECG (independently read by me): NSR with mild sinus arryrthmia, QTc 420   July 24, 2020 ECG (independently read by me): NSR at 60 with mild sinus arrythmia   Se[tember  2021ECG (independently read by me): Sinus rhythm at 63 bpm, PAC, normal intervals.  No significant ST changes.  July 2019 ECG (independently read by me): Normal sinus rhythm at 62 bpm.  Jan 28, 2018 ECG (independently read by me): Normal sinus rhythm at 89 bpm.  Nonspecific ST changes.  Normal intervals.  No ectopy.                                Recent Labs: BMP Latest Ref Rng & Units 09/11/2020 11/19/2019 11/05/2018  Glucose 65 - 99 mg/dL 107(H) 290(H) 224(H)  BUN 8 - 27 mg/dL 20 18 17   Creatinine 0.57 - 1.00 mg/dL 0.70 0.70 0.73  BUN/Creat Ratio 12 - 28 29(H) 26 23  Sodium 134 - 144 mmol/L 139 136 136  Potassium 3.5 - 5.2 mmol/L 4.3 4.4 4.5  Chloride 96 - 106 mmol/L 102 100 97  CO2 20 - 29 mmol/L 24 24 23   Calcium 8.7 - 10.3 mg/dL 9.5 9.7 10.1     Hepatic Function Latest Ref Rng & Units 11/19/2019 11/05/2018 08/07/2018  Total Protein 6.0 - 8.5 g/dL 6.8 7.1 7.4  Albumin 3.8 - 4.8 g/dL 4.3 4.5 4.7  AST 0 - 40 IU/L 43(H) 46(H) 64(H)  ALT 0 - 32 IU/L 37(H) 39(H) 50(H)  Alk Phosphatase 39 - 117 IU/L 125(H) 83 74  Total Bilirubin 0.0 - 1.2 mg/dL 0.6 0.6 0.7    CBC Latest Ref Rng & Units 11/19/2019 01/20/2014  WBC 3.4 - 10.8 x10E3/uL 5.1 6.9  Hemoglobin 11.1 - 15.9 g/dL 12.9 13.0  Hematocrit 34.0 - 46.6 % 39.2 41.2  Platelets 150 - 450 x10E3/uL 190 -   Lab Results  Component Value Date   MCV 97 11/19/2019   MCV 98.8 (A) 01/20/2014   Lab Results  Component Value Date   TSH 0.021 (L) 11/19/2019   Lab Results  Component Value Date   HGBA1C 8.9 (A) 12/21/2019     BNP No results found for: BNP  ProBNP No results found for: PROBNP   Lipid Panel     Component Value Date/Time   CHOL 149 11/19/2019 1145   TRIG 246 (H) 11/19/2019 1145   HDL 35 (L) 11/19/2019 1145   CHOLHDL 4.3 11/19/2019 1145   CHOLHDL 5.2 (H) 08/14/2016 0921   VLDL 27 08/14/2016 0921   LDLCALC 74 11/19/2019 1145   LDLDIRECT 153 (H) 11/17/2017 1541     RADIOLOGY: CT CORONARY MORPH W/CTA COR W/SCORE W/CA W/CM &/OR WO/CM  Addendum Date: 09/18/2020   ADDENDUM REPORT: 09/18/2020 17:21 CLINICAL DATA:  Chest pain EXAM: Cardiac CTA MEDICATIONS: Sub lingual nitro. 41m x 2 TECHNIQUE: The patient  was scanned on a Siemens 1159slice scanner. Gantry rotation speed was 250 msecs. Collimation was 0.6 mm. A 100 kV prospective scan was triggered in the ascending thoracic aorta at 35-75% of the R-R interval. Average HR during the scan was 60 bpm. The 3D data set was interpreted on a dedicated work station using MPR, MIP and VRT modes. A total of 80cc of contrast was used. FINDINGS: [facility]cardiac: [facility] The pulmonary veins drain normally to the left atrium. No LA appendage thrombus seen.  Calcium Score: 1931 Agatston units. Coronary Arteries: Right dominant with no anomalies LM: Distal mixed plaque with approximately 50% stenosis. LAD system: Heavily calcified proximal and mid LAD. Cannot rule out severe stenosis (70-90%) stenosis in the mid LAD. Circumflex system: Medium-sized ramus with calcified plaque in the proximal to mid vessel. Cannot rule out moderate (51-69%) stenosis. The AV LCx has ostial calcified plaque, suspect mild (<50%) stenosis. The remainder of the LCx is relatively small, no obstructive disease. RCA system: Calcified plaque throughout the proximal, mid and distal RCA. Suspect no more than mild (<50%) stenosis. IMPRESSION: 1. Coronary calcium score 1931 Agatston units. This places the patient in the 99th percentile for age and gender, suggesting high risk for future cardiac events. 2.  Distal left main with possible up to 50% stenosis. 3. Heavy calcification proximal and mid LAD, cannot rule out severe stenosis (blooming artifact from extensive calcification makes quantification difficult). 4. Heavy calcification proximal and mid ramus, possible moderate stenosis. Will send for FFR. Dalton Mclean Electronically Signed   By: Loralie Champagne M.D.   On: 09/18/2020 17:21   Result Date: 09/18/2020 EXAM: OVER-READ INTERPRETATION  CT CHEST The following report is an over-read performed by radiologist Dr. Vinnie Langton of [facility], [place] on 09/18/2020.  This over-read does not include interpretation of cardiac or coronary anatomy or pathology. The coronary calcium score/coronary CTA interpretation by the cardiologist is attached. COMPARISON:  None. FINDINGS: Aortic atherosclerosis. Within the visualized portions of the thorax there are no suspicious appearing pulmonary nodules or masses, there is no acute consolidative airspace disease, no pleural effusions, no pneumothorax and no lymphadenopathy. Visualized portions of the upper abdomen are unremarkable. There are no aggressive appearing lytic or blastic lesions noted in the visualized portions of the skeleton. IMPRESSION: 1.  Aortic Atherosclerosis (ICD10-I70.0). Electronically Signed: By: Vinnie Langton M.D. On: 09/18/2020 17:01   CT CORONARY FRACTIONAL FLOW RESERVE DATA PREP  Result Date: 09/19/2020 CLINICAL DATA:  Chest pain EXAM: CT FFR MEDICATIONS: No additional medications. TECHNIQUE: The coronary CTA was sent for FFR. FINDINGS: FFR 0.79 mid LAD and 0.73 distal LAD FFR 0.82 distal LCx FFR 0.80 distal RCA IMPRESSION: 1.  Left main disease does not appear hemodynamically significant. 2. There is extensive calcified plaque in the proximal to mid LAD. The FFR down to 0.73 in the distal LAD suggests hemodynamic significance. 3.  Unable to analyze the ramus by CT FFR. Dalton Mclean Electronically Signed   By: Loralie Champagne M.D.   On: 09/19/2020 17:21    08/10/2020 CLINICAL INFORMATION Sue Study Type: HST  Indication for Sue study: reconfirmation of Sue apnea; patient is on CPAP therapy but DME provider ([employer]) needs reconfirmation for her to receive supplies through insurance. Patient has been on CPAP for 20 years and received a new machine in 2015.  Epworth Sleepiness Score: 22  Sue STUDY TECHNIQUE A multi-channel overnight portable Sue study was performed. The channels recorded were: nasal airflow, thoracic respiratory movement, and oxygen saturation with a pulse oximetry.  Snoring was also monitored.  MEDICATIONS aspirin 81 MG tablet blood glucose meter kit and supplies KIT Continuous Blood Gluc Sensor (FREESTYLE LIBRE 2 SENSOR) MISC cyclobenzaprine (FLEXERIL) 10 MG tablet escitalopram (Sue Baker) 10 MG tablet esomeprazole (NEXIUM 24HR) 20 MG capsule fenofibrate (TRICOR) 145 MG tablet insulin degludec (TRESIBA FLEXTOUCH) 200 UNIT/ML FlexTouch Pen levothyroxine (SYNTHROID) 150 MCG tablet meloxicam (MOBIC) 15 MG tablet metFORMIN (GLUCOPHAGE-XR) 500 MG 24 hr tablet metoprolol tartrate (LOPRESSOR) 25 MG tablet Multiple Vitamins-Minerals (WOMENS MULTI VITAMIN & MINERAL)  TABS olmesartan (BENICAR) 20 MG tablet ONETOUCH VERIO test strip rosuvastatin (CRESTOR) 40 MG tablet traMADol (ULTRAM) 50 MG tablet Patient self administered medications include: N/A.  Sue ARCHITECTURE Patient was studied for 454.7 minutes. The Sue efficiency was 100.0 % and the patient was supine for 49.5%. The arousal index was 0.0 per hour.  RESPIRATORY PARAMETERS The overall AHI was 39.1 per hour, with a central apnea index of 0.0 per hour. The severity during REM Sue cannot be assessed on this home study.  The oxygen nadir was 75% during Sue.  CARDIAC DATA Mean heart rate during Sue was 60.1 bpm.  IMPRESSIONS - Severe obstructive Sue apnea occurred during this study (AHI 39.1/h). - No significant central Sue apnea occurred during this study (CAI = 0.0/h). - Severe oxygen desaturation to a nadir of 75%. - Patient snored 4.4% during the Sue.  DIAGNOSIS - Obstructive Sue Apnea (G47.33) - Nocturnal Hypoxemia (G47.36)  RECOMMENDATIONS - The patient received her last machine in 2015 which currently is set at 15 cm water pressure. If she is in need of a new machine recommend a ResMed AirSence 10 Auto unit. - Positional therapy avoiding supine position during Sue. - Avoid alcohol, sedatives and other CNS depressants that may worsen Sue apnea and disrupt  normal Sue architecture. - Sue hygiene should be reviewed to assess factors that may improve Sue quality. - Weight management and regular exercise should be initiated or continued. - Return to [facility] to discuss the results of this study   ASSESSMENT:    1. Abnormal cardiac CT angiography   2. Exertional angina (HCC)   3. OSA on CPAP   4. Essential hypertension   5. Exertional dyspnea   6. Hyperlipidemia LDL goal <70   7. Hypothyroidism, unspecified type   8. Pre-procedure lab exam     PLAN:  Sue Baker is a very pleasant 79 -year-old female who is a long-standing history of hypertension, hyperlipidemia, diabetes mellitus, hypothyroidism, depression, as well as obstructive Sue apnea.  She has been on CPAP therapy for over 20 years and received a new machine in 2015 after undergoing a subsequent Sue study at the [facility].  Unfortunately, the Sue study is not in the epic records.   Unfortunately [facility] had her prior Sue data in this unfortunately was never transferred to [place].  She was required to undergo a repeat Sue evaluation for documentation of her Sue apnea and this was recently completed on August 10, 2020 again confirming severe Sue apnea with a overall AHI on a home study of 39.1 and significant oxygen desaturation to a nadir of 75%.  Apparently she has not yet received her new machine and has been using her old unit.  We will set her up for an ResMed air sense 10 auto unit.  Her previous machine had been set at 15 cm of water.  When she receives a new machine we will set her up with a range of 12 to 20 cm with EPR of 3.  I also have recommended she have a ResMed AirFit F 30i mask which I believe she will do well with.  I had an extensive discussion with her today and reviewed her coronary CTA which was done after she had had an abnormal routine treadmill test.  She was found to have severe coronary calcification with a  calcium score of 1931 agonist on units, representing 99th percentile for age and gender suggesting high risk for future cardiac  events.  I reviewed her CTA findings with her in detail as well as the FFR analysis.  She has experienced exertional chest tightness as well as exertional dyspnea.  I am adding isosorbide 30 mg daily.  Her resting pulse is 60.  I will initially attempt low-dose metoprolol succinate at 12.5 mg for beta-blocker therapy.  I have recommended definitive cardiac catheterization. I have reviewed the risks, indications, and alternatives to cardiac catheterization, possible angioplasty, and stenting with the patient. Risks include but are not limited to bleeding, infection, vascular injury, stroke, myocardial infection, arrhythmia, kidney injury, radiation-related injury in the case of prolonged fluoroscopy use, emergency cardiac surgery, and death. The patient understands the risks of serious complication is 1-2 in 3748 with diagnostic cardiac cath and 1-2% or less with angioplasty/stenting.  With her significant coronary calcification, she may ultimately require atherectomy prior to stenting and this was also discussed with her and if so this would most likely be a staged procedure.  I have recommended she hold olmesartan the day of her catheterization as well as metformin.  She is now on rosuvastatin 40 mg and fenofibrate 145 mg with her mixed hyperlipidemia for aggressive lipid intervention with a target LDL less than 70.  Her blood pressure today is controlled on olmesartan 20 mg.  She is on levothyroxine 150 mcg for hypothyroidism.  She is diabetic on Jardiance and metformin.  She continues to take Sue Baker.  I will schedule her for her diagnostic catheterization which was done on October 24, 2020.  Laboratory and precath COVID testing will be necessary prior to her study.   Medication Adjustments/Labs and Tests Ordered: Current medicines are reviewed at length with the patient today.   Concerns regarding medicines are outlined above.  Medication changes, Labs and Tests ordered today are listed in the Patient Instructions below. Patient Instructions  Medication Instructions:  Begin taking metoprolol 12.5 mg (half a tablet) once daily. Begin taking isosorbide mononitrate 30 mg once a day. *If you need a refill on your cardiac medications before your next appointment, please call your pharmacy*   Lab Work: BMET and CBC 1 week before your catheterization If you have labs (blood work) drawn today and your tests are completely normal, you will receive your results only by: Marland Kitchen MyChart Message (if you have MyChart) OR . A paper copy in the mail If you have any lab test that is abnormal or we need to change your treatment, we will call you to review the results.   Testing/Procedures:    [place] [address] Dept: [phone] Loc: [place]  09/26/2020  You are scheduled for a Cardiac Catheterization on Tuesday, February 1 with Dr. Shelva Baker.  1. Please arrive at the [facility] (Main Entrance A) at [facility]: [address] [place], [place] [address] at 5:30 AM (This time is two hours before your procedure to ensure your preparation). Free valet parking service is available.   Special note: Every effort is made to have your procedure done on time. Please understand that emergencies sometimes delay scheduled procedures.  2. Diet: Do not eat solid foods after midnight.  The patient may have clear liquids until 5am upon the day of the procedure.  3. Labs: You will need to have blood drawn a week before your procedure (CBC and BMET). You do not need to be fasting.  You will need to have the coronavirus  test completed prior to your procedure. An appointment has been made at 2:45 p.m. on  Friday, October 20, 2020. This is a Drive Up  Visit at the [address] [place]. Please tell them that you are there for pre-procedure testing. Someone will direct you to the appropriate testing line. Stay in your car and someone will be with you shortly. Please make sure to have all other labs completed before this test because you will need to stay quarantined until your procedure. Please take your insurance card to this test.    4. Medication instructions in preparation for your procedure:  Take only a half dose of your long-acting insulin the night before your procedure. Do not take any insulin the day of your procedure.  Do not take oral diabetes medicine the day of your procedure. Do not take metformin the day of your procedure and hold it for 48 hours after your procedure.  Do not take your olmesartan the morning of your procedure.  On the morning of your procedure, take your Aspirin and any morning medicines NOT listed above.  You may use sips of water.  5. Plan for one night stay--bring personal belongings. 6. Bring a current list of your medications and current insurance cards. 7. You MUST have a responsible person to drive you home. 8. Someone MUST be with you the first 24 hours after you arrive home or your discharge will be delayed. 9. Please wear clothes that are easy to get on and off and wear slip-on shoes.  Thank you for allowing [place] to care for you!   -- [facility]    Follow-Up: At [facility], you and your health needs are our priority.  As part of our continuing mission to provide you with exceptional heart care, we have created designated Provider Care Teams.  These Care Teams include your primary Cardiologist (physician) and Advanced Practice Providers (APPs -  Physician Assistants and Nurse Practitioners) who all work together to provide you with the care you need, when you need it.  We recommend signing up for the patient portal called "MyChart".  Sign up  information is provided on this After Visit Summary.  MyChart is used to connect with patients for Virtual Visits (Telemedicine).  Patients are able to view lab/test results, encounter notes, upcoming appointments, etc.  Non-urgent messages can be sent to your provider as well.   To learn more about what you can do with MyChart, go to [email or web address].    Your next appointment:   We will determine your follow up date at the time of your procedure.   Other Instructions We will reach out to your DME company to help update your device and equipment for Sue apnea.          Signed, Sue Majestic, MD  09/28/2020 12:43 PM    [place] [address] [place], [place], [place]  [address] Phone: [phone]

## 2020-09-28 ENCOUNTER — Encounter: Payer: Self-pay | Admitting: Cardiovascular Disease

## 2020-09-28 ENCOUNTER — Telehealth: Payer: Self-pay | Admitting: *Deleted

## 2020-09-28 DIAGNOSIS — G4733 Obstructive sleep apnea (adult) (pediatric): Secondary | ICD-10-CM

## 2020-09-28 NOTE — Telephone Encounter (Signed)
-----   Message from Delray Alt, RN sent at 09/26/2020  3:28 PM EST ----- Regarding: New Machine Good afternoon ladies (and Welcome Back Ms. Burna Mortimer!!)  Dr. Tresa Endo wanted me to reach out to you to request a new machine to be set to Auto Pap for this pt. He would also like her to have an ResMed Airfit F30i mask please. See office note from 09/26/20.  Thank you both for all your help.   Sincerely,  Maralyn Sago

## 2020-09-28 NOTE — Telephone Encounter (Signed)
Order placed to [facility]. 

## 2020-10-03 ENCOUNTER — Other Ambulatory Visit: Payer: 59

## 2020-10-17 LAB — BASIC METABOLIC PANEL
BUN/Creatinine Ratio: 18 (ref 12–28)
BUN: 13 mg/dL (ref 8–27)
CO2: 23 mmol/L (ref 20–29)
Calcium: 9.6 mg/dL (ref 8.7–10.3)
Chloride: 104 mmol/L (ref 96–106)
Creatinine, Ser: 0.74 mg/dL (ref 0.57–1.00)
GFR calc Af Amer: 100 mL/min/{1.73_m2} (ref >59)
GFR calc non Af Amer: 86 mL/min/{1.73_m2} (ref >59)
Glucose: 110 mg/dL — ABNORMAL HIGH (ref 65–99)
Potassium: 4.2 mmol/L (ref 3.5–5.2)
Sodium: 141 mmol/L (ref 134–144)

## 2020-10-17 LAB — CBC
Hematocrit: 43.9 % (ref 34.0–46.6)
Hemoglobin: 14.1 g/dL (ref 11.1–15.9)
MCH: 30.8 pg (ref 26.6–33.0)
MCHC: 32.1 g/dL (ref 31.5–35.7)
MCV: 96 fL (ref 79–97)
Platelets: 232 10*3/uL (ref 150–450)
RBC: 4.58 x10E6/uL (ref 3.77–5.28)
RDW: 11.8 % (ref 11.7–15.4)
WBC: 6.3 10*3/uL (ref 3.4–10.8)

## 2020-10-20 ENCOUNTER — Other Ambulatory Visit (HOSPITAL_COMMUNITY)
Admission: RE | Admit: 2020-10-20 | Discharge: 2020-10-20 | Disposition: A | Discharge status: Home / Self Care | Payer: 59 | Source: Ambulatory Visit | Attending: Cardiovascular Disease | Admitting: Cardiovascular Disease

## 2020-10-20 DIAGNOSIS — U071 COVID-19: Secondary | ICD-10-CM | POA: Diagnosis not present

## 2020-10-20 DIAGNOSIS — Z01812 Encounter for preprocedural laboratory examination: Secondary | ICD-10-CM | POA: Diagnosis present

## 2020-10-20 LAB — SARS CORONAVIRUS 2 (TAT 6-24 HRS): SARS Coronavirus 2: POSITIVE — AB

## 2020-10-21 ENCOUNTER — Telehealth: Payer: Self-pay | Admitting: Physician Assistant

## 2020-10-21 NOTE — Telephone Encounter (Addendum)
   Will route to NL triage nurse team since I am not clear on who will be covering on Monday:  Patient is scheduled for cath on 2/1. Received notification today from pre-cath nurse Rennis Harding that this patient had a positive Covid test on her pre-cath testing yesterday. Chart review indicates the patient actually had already been ill earlier this month and tested positive on January 13th. Per Anderson Malta, patient technically should not have required repeat testing and should be able to keep procedure as planned as long as the patient can produce a copy of her Covid test results from the 13th. I spoke with Ms. Mastrogiovanni and she has completely recovered from [facility] and feels fine. She states the test was done at an urgent care in [facility] but she can't recall the name of it. She does not remember the cross streets either because she is unfamiliar with the area and had to use GPS. She knows it was partnered with [employer]. She will look through her paperwork today and try to upload a copy of the positive result through [place].  In the meantime I told her I would send this message to our office to also help coordinate getting a copy of those results. Rennis Harding requests they be faxed to the cath lab [phone] upon receipt in order to finalize plans for cath.  Will also cc to Dr. Claiborne Billings so he is aware.  Elmo Shumard PA-C

## 2020-10-23 ENCOUNTER — Telehealth: Payer: Self-pay | Admitting: *Deleted

## 2020-10-23 NOTE — Telephone Encounter (Signed)
Results received by patient through [place] to [facility] Cath lab to the Attention of Rennis Harding RN.

## 2020-10-23 NOTE — Telephone Encounter (Signed)
I spoke with patient, procedure had been rescheduled  to 10/25/20 arrive 8 AM for 10 AM procedure, reviewed instructions with patient, confirmed no symptoms concerning for COVID-19.

## 2020-10-23 NOTE — Telephone Encounter (Addendum)
Pt contacted pre-catheterization scheduled at [facility] for: Tuesday October 24, 2020 7:30 AM Verified arrival time and place: [facility]) at: 5:30 AM   No solid food after midnight prior to cath, clear liquids until 5 AM day of procedure.  Hold: Insulin-1/2 usual HS dose prior to procedure Jardiance-AM of procedure Metformin-day of procedure and 48 hours post procedure  Except hold medications AM meds can be  taken pre-cath with sips of water including: ASA 81 mg   Confirmed patient has responsible adult to drive home post procedure and be with patient first 24 hours after arriving home: yes  You are allowed ONE visitor in the waiting room during the time you are at the hospital for your procedure. Both you and your visitor must wear a mask once you enter the hospital.   [facility] to review procedure instructions with patient.   COVID-19 positive 10/05/20, copy of results under Patient Message 10/21/20 asymptomatic per notes.

## 2020-10-23 NOTE — Telephone Encounter (Signed)
Call placed to patient to review procedure instructions, voicemail message, no answer. 

## 2020-10-25 ENCOUNTER — Inpatient Hospital Stay (HOSPITAL_COMMUNITY)
Admission: AD | Admit: 2020-10-25 | Discharge: 2020-10-31 | DRG: 234 | Disposition: A | Discharge status: Home / Self Care | Payer: 59 | Attending: Thoracic Surgery (Cardiothoracic Vascular Surgery) | Admitting: Thoracic Surgery (Cardiothoracic Vascular Surgery)

## 2020-10-25 ENCOUNTER — Encounter (HOSPITAL_COMMUNITY): Payer: Self-pay | Admitting: Cardiovascular Disease

## 2020-10-25 ENCOUNTER — Other Ambulatory Visit: Payer: Self-pay

## 2020-10-25 ENCOUNTER — Inpatient Hospital Stay (HOSPITAL_COMMUNITY): Payer: 59

## 2020-10-25 ENCOUNTER — Encounter (HOSPITAL_COMMUNITY)
Admission: AD | Disposition: A | Discharge status: Home / Self Care | Payer: Self-pay | Source: Home / Self Care | Attending: Thoracic Surgery (Cardiothoracic Vascular Surgery)

## 2020-10-25 DIAGNOSIS — I25118 Atherosclerotic heart disease of native coronary artery with other forms of angina pectoris: Secondary | ICD-10-CM | POA: Diagnosis present

## 2020-10-25 DIAGNOSIS — E118 Type 2 diabetes mellitus with unspecified complications: Secondary | ICD-10-CM | POA: Diagnosis not present

## 2020-10-25 DIAGNOSIS — I77810 Thoracic aortic ectasia: Secondary | ICD-10-CM | POA: Diagnosis present

## 2020-10-25 DIAGNOSIS — Z833 Family history of diabetes mellitus: Secondary | ICD-10-CM

## 2020-10-25 DIAGNOSIS — Z7982 Long term (current) use of aspirin: Secondary | ICD-10-CM

## 2020-10-25 DIAGNOSIS — I2510 Atherosclerotic heart disease of native coronary artery without angina pectoris: Secondary | ICD-10-CM

## 2020-10-25 DIAGNOSIS — Z79890 Hormone replacement therapy (postmenopausal): Secondary | ICD-10-CM | POA: Diagnosis not present

## 2020-10-25 DIAGNOSIS — J9811 Atelectasis: Secondary | ICD-10-CM | POA: Diagnosis not present

## 2020-10-25 DIAGNOSIS — Z87891 Personal history of nicotine dependence: Secondary | ICD-10-CM

## 2020-10-25 DIAGNOSIS — K219 Gastro-esophageal reflux disease without esophagitis: Secondary | ICD-10-CM | POA: Diagnosis present

## 2020-10-25 DIAGNOSIS — Z79899 Other long term (current) drug therapy: Secondary | ICD-10-CM

## 2020-10-25 DIAGNOSIS — Z9889 Other specified postprocedural states: Secondary | ICD-10-CM

## 2020-10-25 DIAGNOSIS — F32A Depression, unspecified: Secondary | ICD-10-CM | POA: Diagnosis present

## 2020-10-25 DIAGNOSIS — D62 Acute posthemorrhagic anemia: Secondary | ICD-10-CM | POA: Diagnosis not present

## 2020-10-25 DIAGNOSIS — E039 Hypothyroidism, unspecified: Secondary | ICD-10-CM

## 2020-10-25 DIAGNOSIS — Z01811 Encounter for preprocedural respiratory examination: Secondary | ICD-10-CM

## 2020-10-25 DIAGNOSIS — E8770 Fluid overload, unspecified: Secondary | ICD-10-CM | POA: Diagnosis not present

## 2020-10-25 DIAGNOSIS — I10 Essential (primary) hypertension: Secondary | ICD-10-CM | POA: Diagnosis present

## 2020-10-25 DIAGNOSIS — G4736 Sleep related hypoventilation in conditions classified elsewhere: Secondary | ICD-10-CM | POA: Diagnosis present

## 2020-10-25 DIAGNOSIS — G4733 Obstructive sleep apnea (adult) (pediatric): Secondary | ICD-10-CM | POA: Diagnosis present

## 2020-10-25 DIAGNOSIS — Z951 Presence of aortocoronary bypass graft: Secondary | ICD-10-CM

## 2020-10-25 DIAGNOSIS — Z09 Encounter for follow-up examination after completed treatment for conditions other than malignant neoplasm: Secondary | ICD-10-CM

## 2020-10-25 DIAGNOSIS — Z794 Long term (current) use of insulin: Secondary | ICD-10-CM | POA: Diagnosis not present

## 2020-10-25 DIAGNOSIS — I25110 Atherosclerotic heart disease of native coronary artery with unstable angina pectoris: Secondary | ICD-10-CM | POA: Diagnosis not present

## 2020-10-25 DIAGNOSIS — E119 Type 2 diabetes mellitus without complications: Secondary | ICD-10-CM | POA: Diagnosis present

## 2020-10-25 DIAGNOSIS — Z8249 Family history of ischemic heart disease and other diseases of the circulatory system: Secondary | ICD-10-CM

## 2020-10-25 DIAGNOSIS — Z807 Family history of other malignant neoplasms of lymphoid, hematopoietic and related tissues: Secondary | ICD-10-CM

## 2020-10-25 DIAGNOSIS — R931 Abnormal findings on diagnostic imaging of heart and coronary circulation: Secondary | ICD-10-CM

## 2020-10-25 DIAGNOSIS — M19049 Primary osteoarthritis, unspecified hand: Secondary | ICD-10-CM | POA: Diagnosis present

## 2020-10-25 DIAGNOSIS — I2584 Coronary atherosclerosis due to calcified coronary lesion: Secondary | ICD-10-CM | POA: Diagnosis present

## 2020-10-25 DIAGNOSIS — R0609 Other forms of dyspnea: Secondary | ICD-10-CM | POA: Diagnosis present

## 2020-10-25 DIAGNOSIS — Z6833 Body mass index (BMI) 33.0-33.9, adult: Secondary | ICD-10-CM

## 2020-10-25 DIAGNOSIS — Z20822 Contact with and (suspected) exposure to covid-19: Secondary | ICD-10-CM | POA: Diagnosis present

## 2020-10-25 DIAGNOSIS — J90 Pleural effusion, not elsewhere classified: Secondary | ICD-10-CM

## 2020-10-25 HISTORY — PX: LEFT HEART CATH AND CORONARY ANGIOGRAPHY: CATH118249

## 2020-10-25 LAB — PROTIME-INR
INR: 1.1 (ref 0.8–1.2)
Prothrombin Time: 13.7 seconds (ref 11.4–15.2)

## 2020-10-25 LAB — URINALYSIS, ROUTINE W REFLEX MICROSCOPIC
Bacteria, UA: NONE SEEN
Bilirubin Urine: NEGATIVE
Glucose, UA: >=500 mg/dL — AB
Hgb urine dipstick: NEGATIVE
Ketones, ur: NEGATIVE mg/dL
Leukocytes,Ua: NEGATIVE
Nitrite: NEGATIVE
Protein, ur: NEGATIVE mg/dL
Specific Gravity, Urine: 1.013 (ref 1.005–1.030)
pH: 7 (ref 5.0–8.0)

## 2020-10-25 LAB — ECHOCARDIOGRAM COMPLETE
Area-P 1/2: 2.66 cm2
Height: 64 in
S' Lateral: 2.5 cm
Weight: 3177.6 oz

## 2020-10-25 LAB — ABO/RH: ABO/RH(D): O NEG

## 2020-10-25 LAB — GLUCOSE, CAPILLARY
Glucose-Capillary: 106 mg/dL — ABNORMAL HIGH (ref 70–99)
Glucose-Capillary: 99 mg/dL (ref 70–99)
Glucose-Capillary: 108 mg/dL — ABNORMAL HIGH (ref 70–99)
Glucose-Capillary: 131 mg/dL — ABNORMAL HIGH (ref 70–99)

## 2020-10-25 LAB — HEMOGLOBIN A1C
Hgb A1c MFr Bld: 6.9 % — ABNORMAL HIGH (ref 4.8–5.6)
Mean Plasma Glucose: 151.33 mg/dL

## 2020-10-25 LAB — APTT: aPTT: 31 seconds (ref 24–36)

## 2020-10-25 SURGERY — LEFT HEART CATH AND CORONARY ANGIOGRAPHY
Anesthesia: LOCAL

## 2020-10-25 MED ORDER — SODIUM CHLORIDE 0.9% FLUSH: 3.0000 mL | Freq: Two times a day (BID) | INTRAVENOUS | Status: DC

## 2020-10-25 MED ORDER — SODIUM CHLORIDE 0.9 % WEIGHT BASED INFUSION
3.0000 mL/kg/h | INTRAVENOUS | Status: DC
  Administered 2020-10-25: 3 mL/kg/h via INTRAVENOUS

## 2020-10-25 MED ORDER — FENTANYL CITRATE (PF) 100 MCG/2ML IJ SOLN
INTRAMUSCULAR | Status: DC | PRN
  Administered 2020-10-25: 25 ug via INTRAVENOUS

## 2020-10-25 MED ORDER — POTASSIUM CHLORIDE 2 MEQ/ML IV SOLN
80.0000 meq | INTRAVENOUS | Status: DC
  Filled 2020-10-25: qty 40

## 2020-10-25 MED ORDER — METOPROLOL TARTRATE 12.5 MG HALF TABLET
12.5000 mg | ORAL_TABLET | Freq: Once | ORAL | Status: AC
  Administered 2020-10-26: 12.5 mg via ORAL
  Filled 2020-10-25: qty 1

## 2020-10-25 MED ORDER — NOREPINEPHRINE 4 MG/250ML-% IV SOLN
0.0000 ug/min | INTRAVENOUS | Status: DC
  Filled 2020-10-25: qty 250

## 2020-10-25 MED ORDER — MIDAZOLAM HCL 2 MG/2ML IJ SOLN
INTRAMUSCULAR | Status: AC
  Filled 2020-10-25: qty 2

## 2020-10-25 MED ORDER — INSULIN REGULAR(HUMAN) IN NACL 100-0.9 UT/100ML-% IV SOLN
INTRAVENOUS | Status: AC
  Administered 2020-10-26: 2.6 [IU]/h via INTRAVENOUS
  Filled 2020-10-25: qty 100

## 2020-10-25 MED ORDER — ASPIRIN EC 81 MG PO TBEC: 81.0000 mg | DELAYED_RELEASE_TABLET | Freq: Every day | ORAL | Status: DC

## 2020-10-25 MED ORDER — MILRINONE LACTATE IN DEXTROSE 20-5 MG/100ML-% IV SOLN
0.3000 ug/kg/min | INTRAVENOUS | Status: DC
  Filled 2020-10-25: qty 100

## 2020-10-25 MED ORDER — HEPARIN SODIUM (PORCINE) 1000 UNIT/ML IJ SOLN
INTRAMUSCULAR | Status: AC
  Filled 2020-10-25: qty 1

## 2020-10-25 MED ORDER — ROSUVASTATIN CALCIUM 20 MG PO TABS
40.0000 mg | ORAL_TABLET | Freq: Every day | ORAL | Status: DC
  Administered 2020-10-27 – 2020-10-31 (×5): 40 mg via ORAL
  Filled 2020-10-25 (×5): qty 2

## 2020-10-25 MED ORDER — CHLORHEXIDINE GLUCONATE CLOTH 2 % EX PADS
6.0000 | MEDICATED_PAD | Freq: Once | CUTANEOUS | Status: AC
  Administered 2020-10-25: 6 via TOPICAL

## 2020-10-25 MED ORDER — IOHEXOL 350 MG/ML SOLN
INTRAVENOUS | Status: DC | PRN
  Administered 2020-10-25: 65 mL

## 2020-10-25 MED ORDER — SODIUM CHLORIDE 0.9% FLUSH
3.0000 mL | Freq: Two times a day (BID) | INTRAVENOUS | Status: DC
  Administered 2020-10-25: 3 mL via INTRAVENOUS

## 2020-10-25 MED ORDER — HYDRALAZINE HCL 20 MG/ML IJ SOLN: 10.0000 mg | INTRAMUSCULAR | Status: AC | PRN

## 2020-10-25 MED ORDER — DEXMEDETOMIDINE HCL IN NACL 400 MCG/100ML IV SOLN
0.1000 ug/kg/h | INTRAVENOUS | Status: AC
  Administered 2020-10-26: .5 ug/kg/h via INTRAVENOUS
  Filled 2020-10-25: qty 100

## 2020-10-25 MED ORDER — SODIUM CHLORIDE 0.9 % IV SOLN: 250.0000 mL | INTRAVENOUS | Status: DC | PRN

## 2020-10-25 MED ORDER — BISACODYL 5 MG PO TBEC
5.0000 mg | DELAYED_RELEASE_TABLET | Freq: Once | ORAL | Status: DC
  Filled 2020-10-25: qty 1

## 2020-10-25 MED ORDER — SODIUM CHLORIDE 0.9 % IV SOLN: INTRAVENOUS | Status: DC

## 2020-10-25 MED ORDER — ONDANSETRON HCL 4 MG/2ML IJ SOLN: 4.0000 mg | Freq: Four times a day (QID) | INTRAMUSCULAR | Status: DC | PRN

## 2020-10-25 MED ORDER — TRANEXAMIC ACID (OHS) BOLUS VIA INFUSION
15.0000 mg/kg | INTRAVENOUS | Status: AC
  Administered 2020-10-26: 1351.5 mg via INTRAVENOUS
  Filled 2020-10-25: qty 1352

## 2020-10-25 MED ORDER — CHLORHEXIDINE GLUCONATE CLOTH 2 % EX PADS
6.0000 | MEDICATED_PAD | Freq: Once | CUTANEOUS | Status: AC
  Administered 2020-10-26: 6 via TOPICAL

## 2020-10-25 MED ORDER — TRANEXAMIC ACID (OHS) PUMP PRIME SOLUTION
2.0000 mg/kg | INTRAVENOUS | Status: DC
  Filled 2020-10-25: qty 1.8

## 2020-10-25 MED ORDER — TRANEXAMIC ACID 1000 MG/10ML IV SOLN
1.5000 mg/kg/h | INTRAVENOUS | Status: AC
  Administered 2020-10-26: 1.5 mg/kg/h via INTRAVENOUS
  Filled 2020-10-25: qty 25

## 2020-10-25 MED ORDER — TEMAZEPAM 15 MG PO CAPS
15.0000 mg | ORAL_CAPSULE | Freq: Once | ORAL | Status: DC | PRN
  Filled 2020-10-25: qty 1

## 2020-10-25 MED ORDER — PLASMA-LYTE 148 IV SOLN
INTRAVENOUS | Status: DC
  Filled 2020-10-25: qty 2.5

## 2020-10-25 MED ORDER — VERAPAMIL HCL 2.5 MG/ML IV SOLN
INTRAVENOUS | Status: AC
  Filled 2020-10-25: qty 2

## 2020-10-25 MED ORDER — HEPARIN SODIUM (PORCINE) 1000 UNIT/ML IJ SOLN
INTRAMUSCULAR | Status: DC | PRN
  Administered 2020-10-25: 4500 [IU] via INTRAVENOUS

## 2020-10-25 MED ORDER — VANCOMYCIN HCL 1500 MG/300ML IV SOLN
1500.0000 mg | INTRAVENOUS | Status: AC
  Administered 2020-10-26: 1500 mg via INTRAVENOUS
  Filled 2020-10-25: qty 300

## 2020-10-25 MED ORDER — ASPIRIN 81 MG PO CHEW: 81.0000 mg | CHEWABLE_TABLET | ORAL | Status: DC

## 2020-10-25 MED ORDER — PANTOPRAZOLE SODIUM 40 MG PO TBEC: 40.0000 mg | DELAYED_RELEASE_TABLET | Freq: Every day | ORAL | Status: DC

## 2020-10-25 MED ORDER — EPINEPHRINE HCL 5 MG/250ML IV SOLN IN NS
0.0000 ug/min | INTRAVENOUS | Status: DC
  Filled 2020-10-25: qty 250

## 2020-10-25 MED ORDER — SODIUM CHLORIDE 0.9% FLUSH: 3.0000 mL | INTRAVENOUS | Status: DC | PRN

## 2020-10-25 MED ORDER — SODIUM CHLORIDE 0.9 % IV SOLN
INTRAVENOUS | Status: DC
  Filled 2020-10-25: qty 30

## 2020-10-25 MED ORDER — INSULIN GLARGINE 100 UNIT/ML ~~LOC~~ SOLN
15.0000 [IU] | Freq: Every day | SUBCUTANEOUS | Status: DC
  Administered 2020-10-25: 15 [IU] via SUBCUTANEOUS
  Filled 2020-10-25 (×2): qty 0.15

## 2020-10-25 MED ORDER — FENTANYL CITRATE (PF) 100 MCG/2ML IJ SOLN
INTRAMUSCULAR | Status: AC
  Filled 2020-10-25: qty 2

## 2020-10-25 MED ORDER — METOPROLOL SUCCINATE ER 25 MG PO TB24: 12.5000 mg | ORAL_TABLET | Freq: Every day | ORAL | Status: DC

## 2020-10-25 MED ORDER — LEVOTHYROXINE SODIUM 75 MCG PO TABS
150.0000 ug | ORAL_TABLET | Freq: Every day | ORAL | Status: DC
  Administered 2020-10-26 – 2020-10-31 (×6): 150 ug via ORAL
  Filled 2020-10-25 (×6): qty 2

## 2020-10-25 MED ORDER — ESCITALOPRAM OXALATE 10 MG PO TABS
10.0000 mg | ORAL_TABLET | Freq: Every day | ORAL | Status: DC
  Administered 2020-10-27 – 2020-10-31 (×5): 10 mg via ORAL
  Filled 2020-10-25 (×5): qty 1

## 2020-10-25 MED ORDER — LIDOCAINE HCL (PF) 1 % IJ SOLN
INTRAMUSCULAR | Status: DC | PRN
  Administered 2020-10-25: 2 mL

## 2020-10-25 MED ORDER — PERFLUTREN LIPID MICROSPHERE
1.0000 mL | INTRAVENOUS | Status: AC | PRN
  Administered 2020-10-25: 2 mL via INTRAVENOUS
  Filled 2020-10-25: qty 10

## 2020-10-25 MED ORDER — LIDOCAINE HCL (PF) 1 % IJ SOLN
INTRAMUSCULAR | Status: AC
  Filled 2020-10-25: qty 30

## 2020-10-25 MED ORDER — ACETAMINOPHEN 325 MG PO TABS: 650.0000 mg | ORAL_TABLET | ORAL | Status: DC | PRN

## 2020-10-25 MED ORDER — SODIUM CHLORIDE 0.9 % WEIGHT BASED INFUSION: 1.0000 mL/kg/h | INTRAVENOUS | Status: DC

## 2020-10-25 MED ORDER — HEPARIN (PORCINE) IN NACL 1000-0.9 UT/500ML-% IV SOLN
INTRAVENOUS | Status: DC | PRN
  Administered 2020-10-25: 500 mL

## 2020-10-25 MED ORDER — PHENYLEPHRINE HCL-NACL 20-0.9 MG/250ML-% IV SOLN
30.0000 ug/min | INTRAVENOUS | Status: AC
  Administered 2020-10-26: 20 ug/min via INTRAVENOUS
  Filled 2020-10-25: qty 250

## 2020-10-25 MED ORDER — MIDAZOLAM HCL 2 MG/2ML IJ SOLN
INTRAMUSCULAR | Status: DC | PRN
  Administered 2020-10-25 (×2): 1 mg via INTRAVENOUS

## 2020-10-25 MED ORDER — CHLORHEXIDINE GLUCONATE 0.12 % MT SOLN
15.0000 mL | Freq: Once | OROMUCOSAL | Status: AC
  Administered 2020-10-26: 15 mL via OROMUCOSAL
  Filled 2020-10-25: qty 15

## 2020-10-25 MED ORDER — SODIUM CHLORIDE 0.9 % IV SOLN
750.0000 mg | INTRAVENOUS | Status: AC
  Administered 2020-10-26: 750 mg via INTRAVENOUS
  Filled 2020-10-25: qty 750

## 2020-10-25 MED ORDER — LABETALOL HCL 5 MG/ML IV SOLN: 10.0000 mg | INTRAVENOUS | Status: AC | PRN

## 2020-10-25 MED ORDER — SODIUM CHLORIDE 0.9 % IV SOLN
1.5000 g | INTRAVENOUS | Status: AC
  Administered 2020-10-26: 1.5 g via INTRAVENOUS
  Filled 2020-10-25: qty 1.5

## 2020-10-25 MED ORDER — FENOFIBRATE 160 MG PO TABS
160.0000 mg | ORAL_TABLET | Freq: Every day | ORAL | Status: DC
  Administered 2020-10-27 – 2020-10-31 (×5): 160 mg via ORAL
  Filled 2020-10-25 (×5): qty 1

## 2020-10-25 MED ORDER — MANNITOL 20 % IV SOLN
INTRAVENOUS | Status: DC
  Filled 2020-10-25: qty 13

## 2020-10-25 MED ORDER — VERAPAMIL HCL 2.5 MG/ML IV SOLN
INTRAVENOUS | Status: DC | PRN
  Administered 2020-10-25: 10 mL via INTRA_ARTERIAL

## 2020-10-25 MED ORDER — NITROGLYCERIN IN D5W 200-5 MCG/ML-% IV SOLN
2.0000 ug/min | INTRAVENOUS | Status: DC
  Filled 2020-10-25: qty 250

## 2020-10-25 MED ORDER — HEPARIN (PORCINE) IN NACL 1000-0.9 UT/500ML-% IV SOLN
INTRAVENOUS | Status: AC
  Filled 2020-10-25: qty 1000

## 2020-10-25 SURGICAL SUPPLY — 14 items
BAG SNAP BAND KOVER 36X36 (MISCELLANEOUS) ×2 IMPLANT
CATH INFINITI 5 FR JL3.5 (CATHETERS) ×2 IMPLANT
CATH INFINITI JR4 5F (CATHETERS) ×2 IMPLANT
CATH OPTITORQUE TIG 4.0 5F (CATHETERS) ×2 IMPLANT
COVER DOME SNAP 22 D (MISCELLANEOUS) ×2 IMPLANT
DEVICE RAD COMP TR BAND LRG (VASCULAR PRODUCTS) ×2 IMPLANT
GLIDESHEATH SLEND SS 6F .021 (SHEATH) ×2 IMPLANT
GUIDEWIRE INQWIRE 1.5J.035X260 (WIRE) ×1 IMPLANT
INQWIRE 1.5J .035X260CM (WIRE) ×2
KIT HEART LEFT (KITS) ×2 IMPLANT
PACK CARDIAC CATHETERIZATION (CUSTOM PROCEDURE TRAY) ×2 IMPLANT
SHEATH PROBE COVER 6X72 (BAG) ×2 IMPLANT
TRANSDUCER W/STOPCOCK (MISCELLANEOUS) ×2 IMPLANT
TUBING CIL FLEX 10 FLL-RA (TUBING) ×2 IMPLANT

## 2020-10-25 NOTE — Progress Notes (Signed)
Discussed IS (1600 mL), sternal precautions, mobility post op and d/c planning. Pt receptive. Her daughter will be with her at d/c. Gave materials to view.  [place], ACSM 2:29 PM 10/25/2020

## 2020-10-25 NOTE — Consult Note (Signed)
[address]       [place] [address]             [phone]        Sharol Oestreich [place] Medical Record #[id number] Date of Birth: 11/01/1955  Referring: No ref. provider found Primary Care: Antony Contras, MD Primary Cardiologist:No primary care provider on file.  Chief Complaint:   No chief complaint on file.   History of Present Illness:     65 year old female presents for an elective left heart cath and was found to have two-vessel coronary artery disease.  She has a long history of diabetes and strong family history of coronary artery disease, and has been proactive in regards to regular screenings.  Over the last several months she has noticed some early fatigue and exertional dyspnea.  She has had some occasional chest pressure.  After speaking with her cardiologist she underwent further evaluation, and had her left heart cath today.  She is currently asymptomatic.   Past Medical and Surgical History: Previous Chest Surgery: Bilateral breast reduction surgery. Previous Chest Radiation: No Diabetes Mellitus: Yes.  HbA1C 8.9 in 2021 Creatinine: 0.74  Past Medical History:  Diagnosis Date  . Allergy   . Arthritis   . Cataract   . Diabetes mellitus without complication ([place])   . GERD (gastroesophageal reflux disease)   . Hyperlipidemia   . Hypertension   . Sleep apnea   . Thyroid disease     Past Surgical History:  Procedure Laterality Date  . BREAST SURGERY    . CARPAL TUNNEL RELEASE    . COLONOSCOPY  03/03/2020  . EYE SURGERY    . KNEE SURGERY     left  . LEFT HEART CATH AND CORONARY ANGIOGRAPHY N/A 10/25/2020   Procedure: LEFT HEART CATH AND CORONARY ANGIOGRAPHY;  Surgeon: Troy Sine, MD;  Location: [facility];  Service: Cardiovascular;  Laterality: N/A;  . TUBAL LIGATION      Social History: Support: Lives with her daughter and grandson.  Social History   Tobacco Use  Smoking Status Former Smoker  . Quit date:  09/30/1999  . Years since quitting: 21.0  Smokeless Tobacco Never Used    Social History   Substance and Sexual Activity  Alcohol Use Yes   Comment: rarely     Allergies  Allergen Reactions  . Trulicity [Dulaglutide] Other (See Comments)    Changed vision, couldn't read    Current Facility-Administered Medications  Medication Dose Route Frequency Provider Last Rate Last Admin  . 0.9 %  sodium chloride infusion   Intravenous Continuous Troy Sine, MD 125 mL/hr at 10/25/20 1216 125 mL/hr at 10/25/20 1216  . 0.9 %  sodium chloride infusion  250 mL Intravenous PRN Troy Sine, MD      . acetaminophen (TYLENOL) tablet 650 mg  650 mg Oral Q4H PRN Troy Sine, MD      . Derrill Memo ON 10/26/2020] aspirin EC tablet 81 mg  81 mg Oral Daily Troy Sine, MD      . bisacodyl (DULCOLAX) EC tablet 5 mg  5 mg Oral Once [name], MD      . Derrill Memo ON 10/26/2020] cefUROXime (ZINACEF) 1.5 g in sodium chloride 0.9 % 100 mL IVPB  1.5 g Intravenous To OR Amori Colomb, Lucile Crater, MD      . Derrill Memo ON 10/26/2020] cefUROXime (ZINACEF) 750 mg in sodium chloride 0.9 % 100 mL IVPB  750  mg Intravenous To OR Koah Chisenhall, Lucile Crater, MD      . Derrill Memo ON 10/26/2020] chlorhexidine (PERIDEX) 0.12 % solution 15 mL  15 mL Mouth/Throat Once Keali Mccraw, Lucile Crater, MD      . Chlorhexidine Gluconate Cloth 2 % PADS 6 each  6 each Topical Once Omarie Parcell, Lucile Crater, MD       And  . Derrill Memo ON 10/26/2020] Chlorhexidine Gluconate Cloth 2 % PADS 6 each  6 each Topical Once Kynnedy Carreno, Lucile Crater, MD      . Derrill Memo ON 10/26/2020] dexmedetomidine (PRECEDEX) 400 MCG/100ML (4 mcg/mL) infusion  0.1-0.7 mcg/kg/hr Intravenous To OR Joann Jorge, Lucile Crater, MD      . Derrill Memo ON 10/26/2020] EPINEPHrine (ADRENALIN) 4 mg in NS 250 mL (0.016 mg/mL) premix infusion  0-10 mcg/min Intravenous To OR [name], MD      . Derrill Memo ON 10/26/2020] escitalopram (LEXAPRO) tablet 10 mg  10 mg Oral Daily Troy Sine, MD      . Derrill Memo ON 10/26/2020]  fenofibrate tablet 160 mg  160 mg Oral Daily Troy Sine, MD      . Derrill Memo ON 10/26/2020] heparin 30,000 units/NS 1000 mL solution for CELLSAVER   Other To OR Margretta Zamorano, Lucile Crater, MD      . Derrill Memo ON 10/26/2020] heparin sodium (porcine) 2,500 Units, papaverine 30 mg in electrolyte-148 (PLASMALYTE-148) 500 mL irrigation   Irrigation To OR Rudolpho Claxton O, MD      . hydrALAZINE (APRESOLINE) injection 10 mg  10 mg Intravenous Q20 Min PRN Troy Sine, MD      . insulin glargine (LANTUS) injection 15 Units  15 Units Subcutaneous QHS Troy Sine, MD      . Derrill Memo ON 10/26/2020] insulin regular, human (MYXREDLIN) 100 units/ 100 mL infusion   Intravenous To OR Mirella Gueye, Lucile Crater, MD      . Derrill Memo ON 10/26/2020] [place] Blood Cardioplegia vial (lidocaine/magnesium/mannitol 0.26g-4g-6.4g)   Intracoronary To OR Chanci Ojala, Lucile Crater, MD      . labetalol (NORMODYNE) injection 10 mg  10 mg Intravenous Q10 min PRN Troy Sine, MD      . Derrill Memo ON 10/26/2020] levothyroxine (SYNTHROID) tablet 150 mcg  150 mcg Oral QAC breakfast Troy Sine, MD      . Derrill Memo ON 10/26/2020] metoprolol succinate (TOPROL-XL) 24 hr tablet 12.5 mg  12.5 mg Oral Daily Troy Sine, MD      . Derrill Memo ON 10/26/2020] metoprolol tartrate (LOPRESSOR) tablet 12.5 mg  12.5 mg Oral Once [name], MD      . Derrill Memo ON 10/26/2020] milrinone (PRIMACOR) 20 MG/100 ML (0.2 mg/mL) infusion  0.3 mcg/kg/min Intravenous To OR Iolani Twilley, Lucile Crater, MD      . Derrill Memo ON 10/26/2020] nitroGLYCERIN 50 mg in dextrose 5 % 250 mL (0.2 mg/mL) infusion  2-200 mcg/min Intravenous To OR Adalae Baysinger, Lucile Crater, MD      . Derrill Memo ON 10/26/2020] norepinephrine (LEVOPHED) 10m in 2526mpremix infusion  0-40 mcg/min Intravenous To OR Hazley Dezeeuw O, MD      . ondansetron (ZOFRAN) injection 4 mg  4 mg Intravenous Q6H PRN KeTroy SineMD      . [SDerrill MemoN 10/26/2020] pantoprazole (PROTONIX) EC tablet 40 mg  40 mg Oral Daily KeShelva Majestic, MD      .  perflutren lipid microspheres (DEFINITY) IV suspension  1-10 mL Intravenous PRN KeTroy SineMD   2 mL at 10/25/20 1537  . [START ON 10/26/2020] phenylephrine (  NEOSYNEPHRINE) 20-0.9 MG/250ML-% infusion  30-200 mcg/min Intravenous To OR Zuriah Bordas, Lucile Crater, MD      . Derrill Memo ON 10/26/2020] potassium chloride injection 80 mEq  80 mEq Other To OR Barbi Kumagai, Lucile Crater, MD      . Derrill Memo ON 10/26/2020] rosuvastatin (CRESTOR) tablet 40 mg  40 mg Oral Daily Shelva Majestic A, MD      . sodium chloride flush (NS) 0.9 % injection 3 mL  3 mL Intravenous Q12H Troy Sine, MD      . sodium chloride flush (NS) 0.9 % injection 3 mL  3 mL Intravenous Q12H Troy Sine, MD      . sodium chloride flush (NS) 0.9 % injection 3 mL  3 mL Intravenous PRN Troy Sine, MD      . temazepam (RESTORIL) capsule 15 mg  15 mg Oral Once PRN [name], MD      . Derrill Memo ON 10/26/2020] tranexamic acid (CYKLOKAPRON) 2,500 mg in sodium chloride 0.9 % 250 mL (10 mg/mL) infusion  1.5 mg/kg/hr Intravenous To OR Carlester Kasparek, Lucile Crater, MD      . Derrill Memo ON 10/26/2020] tranexamic acid (CYKLOKAPRON) bolus via infusion - over 30 minutes 1,351.5 mg  15 mg/kg Intravenous To OR Casondra Gasca, Lucile Crater, MD      . Derrill Memo ON 10/26/2020] tranexamic acid (CYKLOKAPRON) pump prime solution 180 mg  2 mg/kg Intracatheter To OR Deunte Bledsoe, Lucile Crater, MD      . Derrill Memo ON 10/26/2020] vancomycin (VANCOREADY) IVPB 1500 mg/300 mL  1,500 mg Intravenous To OR Niema Carrara, Lucile Crater, MD        Facility-Administered Medications Prior to Admission  Medication Dose Route Frequency Provider Last Rate Last Admin  . 0.9 %  sodium chloride infusion  500 mL Intravenous Once Mansouraty, Telford Nab., MD       Medications Prior to Admission  Medication Sig Dispense Refill Last Dose  . aspirin 81 MG tablet Take 81 mg by mouth daily.   10/25/2020 at 0700  . BYDUREON BCISE 2 MG/0.85ML AUIJ Inject 2 mg into the skin every Friday.   Past Week at Unknown time  . escitalopram  (LEXAPRO) 10 MG tablet TAKE 1 TABLET BY MOUTH DAILY (Patient taking differently: Take 10 mg by mouth daily.) 90 tablet 3 10/25/2020 at Unknown time  . esomeprazole (NEXIUM 24HR) 20 MG capsule Take 1 capsule (20 mg total) by mouth daily at 12 noon.   10/25/2020 at Unknown time  . fenofibrate (TRICOR) 145 MG tablet Take 1 tablet (145 mg total) by mouth daily. 90 tablet 1 10/25/2020 at Unknown time  . insulin glargine (LANTUS SOLOSTAR) 100 UNIT/ML Solostar Pen Inject 15 Units into the skin at bedtime.   10/24/2020 at Unknown time  . isosorbide mononitrate (IMDUR) 30 MG 24 hr tablet Take 1 tablet (30 mg total) by mouth daily. 90 tablet 3 10/25/2020 at Unknown time  . JARDIANCE 25 MG TABS tablet Take 25 mg by mouth daily.   10/24/2020 at Unknown time  . levothyroxine (SYNTHROID) 150 MCG tablet TAKE 1 TABLET BY MOUTH DAILY BEFORE BREAKFAST (Patient taking differently: Take 150 mcg by mouth daily before breakfast. TAKE 1 TABLET BY MOUTH DAILY BEFORE BREAKFAST) 90 tablet 1 10/25/2020 at Unknown time  . meloxicam (MOBIC) 15 MG tablet TAKE 1 TABLET BY MOUTH EVERY DAY AS NEEDED FOR PAIN (Patient taking differently: Take 15 mg by mouth daily. TAKE 1 TABLET BY MOUTH EVERY DAY FOR PAIN) 90 tablet 0 Past Month at Unknown time  .  metFORMIN (GLUCOPHAGE-XR) 500 MG 24 hr tablet Take 1,000 mg by mouth 2 (two) times daily.   10/24/2020 at Unknown time  . metoprolol succinate (TOPROL XL) 25 MG 24 hr tablet Take 0.5 tablets (12.5 mg total) by mouth daily. 45 tablet 3 10/25/2020 at Unknown time  . Multiple Vitamins-Minerals (WOMENS MULTI VITAMIN & MINERAL) TABS Take 1 tablet by mouth daily.   10/24/2020 at Unknown time  . olmesartan (BENICAR) 20 MG tablet TAKE 1 TABLET BY MOUTH EVERY DAY (Patient taking differently: Take 20 mg by mouth daily.) 90 tablet 0 10/24/2020 at Unknown time  . Propylene Glycol (SYSTANE COMPLETE) 0.6 % SOLN Place 1 drop into both eyes 4 (four) times daily as needed (dry eyes).   10/24/2020 at Unknown time  . rosuvastatin  (CRESTOR) 40 MG tablet Take 1 tablet (40 mg total) by mouth daily. 90 tablet 1 10/25/2020 at Unknown time  . traMADol (ULTRAM) 50 MG tablet Take 1-2 tablets (50-100 mg total) by mouth every 8 (eight) hours as needed. 30 tablet 0 Past Month at Unknown time  . blood glucose meter kit and supplies KIT Dispense based on patient and insurance preference. Use up to four times daily as directed. (FOR ICD-10 E11.9) 1 each 0   . Continuous Blood Gluc Sensor (FREESTYLE LIBRE 2 SENSOR) MISC      . cyclobenzaprine (FLEXERIL) 10 MG tablet Take 10 mg by mouth 3 (three) times daily as needed for muscle spasms.   More than a month at Unknown time  . ONETOUCH VERIO test strip USE AS DIRECTED 100 strip 5     Family History  Problem Relation Age of Onset  . Healthy Mother   . Heart disease Father   . Peripheral Artery Disease Father   . Hypertension Father   . Thyroid disease Sister   . Cancer Sister   . Hypertension Maternal Grandfather   . Heart disease Maternal Grandfather   . Diabetes Maternal Grandfather   . Colon cancer Neg Hx   . Colon polyps Neg Hx   . Esophageal cancer Neg Hx   . Rectal cancer Neg Hx   . Stomach cancer Neg Hx      Review of Systems:   Review of Systems  Constitutional: Positive for malaise/fatigue.  Respiratory: Positive for cough.   Cardiovascular: Positive for chest pain.      Physical Exam: BP 120/64 (BP Location: Left Arm)   Pulse 67   Temp 98 F (36.7 C) (Oral)   Resp 19   Ht 5' 4"  (1.626 m)   Wt 90.1 kg   SpO2 97%   BMI 34.09 kg/m  Physical Exam Constitutional:      General: She is not in acute distress.    Appearance: Normal appearance. She is obese. She is not ill-appearing.  Eyes:     Extraocular Movements: Extraocular movements intact.  Cardiovascular:     Rate and Rhythm: Normal rate and regular rhythm.  Pulmonary:     Effort: Pulmonary effort is normal. No respiratory distress.  Abdominal:     General: There is no distension.   Musculoskeletal:        General: Normal range of motion.     Cervical back: Normal range of motion.  Neurological:     General: No focal deficit present.     Mental Status: She is alert and oriented to person, place, and time.       Diagnostic Studies & Laboratory data:    Left Heart Catherization: Left heart  catheter was reviewed.  She does not have any significant stenosis in the right coronary.  She has a proximal lesion in the circumflex and the ramus.  She also has stent in the branches. Echo: Pending   I have independently reviewed the above radiologic studies and discussed with the patient   Recent Lab Findings: Lab Results  Component Value Date   WBC 6.3 10/16/2020   HGB 14.1 10/16/2020   HCT 43.9 10/16/2020   PLT 232 10/16/2020   GLUCOSE 110 (H) 10/16/2020   CHOL 149 11/19/2019   TRIG 246 (H) 11/19/2019   HDL 35 (L) 11/19/2019   LDLDIRECT 153 (H) 11/17/2017   LDLCALC 74 11/19/2019   ALT 37 (H) 11/19/2019   AST 43 (H) 11/19/2019   NA 141 10/16/2020   K 4.2 10/16/2020   CL 104 10/16/2020   CREATININE 0.74 10/16/2020   BUN 13 10/16/2020   CO2 23 10/16/2020   TSH 0.021 (L) 11/19/2019   HGBA1C 8.9 (A) 12/21/2019      Assessment / Plan:   65 year old with history of diabetes and strong family history of coronary artery disease presents with a left heart cath which did not show significant stenosis.  She is undergoing an echocardiogram to evaluate for right left heart function as well as valvular disease.  She is tentatively scheduled for tomorrow for bypass grafting.     I  spent 30 minutes counseling the patient face to face.   [name] 10/25/2020 3:49 PM

## 2020-10-25 NOTE — Progress Notes (Signed)
  Echocardiogram 2D Echocardiogram has been performed.  Sue Baker 10/25/2020, 3:55 PM

## 2020-10-25 NOTE — Anesthesia Preprocedure Evaluation (Addendum)
Anesthesia Evaluation  Patient identified by MRN, date of birth, ID band Patient awake    Reviewed: Allergy & Precautions, NPO status , Patient's Chart, lab work & pertinent test results  Airway Mallampati: III  TM Distance: >3 FB Neck ROM: Full    Dental  (+) Dental Advisory Given, Teeth Intact   Pulmonary sleep apnea , former smoker,    Pulmonary exam normal breath sounds clear to auscultation       Cardiovascular hypertension, Pt. on home beta blockers and Pt. on medications + CAD  Normal cardiovascular exam Rhythm:Regular Rate:Normal  Echo 10/25/2020 1. Left ventricular ejection fraction, by estimation, is 60 to 65%. The left ventricle has normal function. The left ventricle has no regional wall motion abnormalities. There is mild left ventricular hypertrophy. Left ventricular diastolic parameters were normal.  2. Right ventricular systolic function is normal. The right ventricular size is normal. Tricuspid regurgitation signal is inadequate for assessing PA pressure.  3. The mitral valve is normal in structure. No evidence of mitral valve regurgitation. No evidence of mitral stenosis.  4. The aortic valve was not well visualized. Aortic valve regurgitation is not visualized. No aortic stenosis is present.  5. The inferior vena cava is normal in size with greater than 50% respiratory variability, suggesting right atrial pressure of 3 mmHg   Neuro/Psych PSYCHIATRIC DISORDERS Depression negative neurological ROS     GI/Hepatic Neg liver ROS, GERD  ,  Endo/Other  diabetesHypothyroidism   Renal/GU negative Renal ROS     Musculoskeletal  (+) Arthritis ,   Abdominal   Peds  Hematology negative hematology ROS (+)   Anesthesia Other Findings   Reproductive/Obstetrics                            Anesthesia Physical Anesthesia Plan  ASA: IV  Anesthesia Plan: General   Post-op Pain  Management:    Induction: Intravenous  PONV Risk Score and Plan: 4 or greater and Treatment may vary due to age or medical condition and Midazolam  Airway Management Planned: Oral ETT  Additional Equipment: CVP, Arterial line, TEE and Ultrasound Guidance Line Placement  Intra-op Plan: Utilization Of Total Body Hypothermia per surgeon request  Post-operative Plan: Post-operative intubation/ventilation  Informed Consent: I have reviewed the patients History and Physical, chart, labs and discussed the procedure including the risks, benefits and alternatives for the proposed anesthesia with the patient or authorized representative who has indicated his/her understanding and acceptance.     Dental advisory given  Plan Discussed with: CRNA  Anesthesia Plan Comments:        Anesthesia Quick Evaluation

## 2020-10-25 NOTE — Interval H&P Note (Signed)
Cath Lab Visit (complete for each Cath Lab visit)  Clinical Evaluation Leading to the Procedure:   ACS: No.  Non-ACS:    Anginal Classification: CCS III  Anti-ischemic medical therapy: Maximal Therapy (2 or more classes of medications)  Non-Invasive Test Results: High-risk stress test findings: cardiac mortality >3%/year  Prior CABG: No previous CABG      History and Physical Interval Note:  10/25/2020 9:18 AM  Sue Baker  has presented today for surgery, with the diagnosis of cad.  The various methods of treatment have been discussed with the patient and family. After consideration of risks, benefits and other options for treatment, the patient has consented to  Procedure(s): LEFT HEART CATH AND CORONARY ANGIOGRAPHY (N/A) as a surgical intervention.  The patient's history has been reviewed, patient examined, no change in status, stable for surgery.  I have reviewed the patient's chart and labs.  Questions were answered to the patient's satisfaction.     Shelva Majestic

## 2020-10-25 NOTE — Progress Notes (Signed)
TR band is removed at this time. Insertion site is level 0. 

## 2020-10-26 ENCOUNTER — Inpatient Hospital Stay (HOSPITAL_COMMUNITY): Payer: 59 | Admitting: Certified Registered Nurse Anesthetist

## 2020-10-26 ENCOUNTER — Inpatient Hospital Stay (HOSPITAL_COMMUNITY)
Admission: AD | Disposition: A | Discharge status: Home / Self Care | Payer: Self-pay | Source: Home / Self Care | Attending: Thoracic Surgery (Cardiothoracic Vascular Surgery)

## 2020-10-26 ENCOUNTER — Inpatient Hospital Stay (HOSPITAL_COMMUNITY): Payer: 59

## 2020-10-26 DIAGNOSIS — Z951 Presence of aortocoronary bypass graft: Secondary | ICD-10-CM

## 2020-10-26 DIAGNOSIS — I25110 Atherosclerotic heart disease of native coronary artery with unstable angina pectoris: Secondary | ICD-10-CM

## 2020-10-26 HISTORY — PX: CORONARY ARTERY BYPASS GRAFT: SHX141

## 2020-10-26 HISTORY — PX: TEE WITHOUT CARDIOVERSION: SHX5443

## 2020-10-26 LAB — POCT I-STAT 7, (LYTES, BLD GAS, ICA,H+H)
Acid-Base Excess: 1 mmol/L (ref 0.0–2.0)
Acid-Base Excess: 2 mmol/L (ref 0.0–2.0)
Acid-Base Excess: 4 mmol/L — ABNORMAL HIGH (ref 0.0–2.0)
Acid-Base Excess: 3 mmol/L — ABNORMAL HIGH (ref 0.0–2.0)
Acid-Base Excess: 1 mmol/L (ref 0.0–2.0)
Acid-base deficit: 3 mmol/L — ABNORMAL HIGH (ref 0.0–2.0)
Acid-base deficit: 5 mmol/L — ABNORMAL HIGH (ref 0.0–2.0)
Acid-base deficit: 5 mmol/L — ABNORMAL HIGH (ref 0.0–2.0)
Bicarbonate: 27.5 mmol/L (ref 20.0–28.0)
Bicarbonate: 26.1 mmol/L (ref 20.0–28.0)
Bicarbonate: 28.5 mmol/L — ABNORMAL HIGH (ref 20.0–28.0)
Bicarbonate: 27.4 mmol/L (ref 20.0–28.0)
Bicarbonate: 25.1 mmol/L (ref 20.0–28.0)
Bicarbonate: 22.2 mmol/L (ref 20.0–28.0)
Bicarbonate: 20.6 mmol/L (ref 20.0–28.0)
Bicarbonate: 21 mmol/L (ref 20.0–28.0)
Calcium, Ion: 1.27 mmol/L (ref 1.15–1.40)
Calcium, Ion: 0.93 mmol/L — ABNORMAL LOW (ref 1.15–1.40)
Calcium, Ion: 1.08 mmol/L — ABNORMAL LOW (ref 1.15–1.40)
Calcium, Ion: 1.08 mmol/L — ABNORMAL LOW (ref 1.15–1.40)
Calcium, Ion: 1.37 mmol/L (ref 1.15–1.40)
Calcium, Ion: 1.36 mmol/L (ref 1.15–1.40)
Calcium, Ion: 1.3 mmol/L (ref 1.15–1.40)
Calcium, Ion: 1.28 mmol/L (ref 1.15–1.40)
HCT: 34 % — ABNORMAL LOW (ref 36.0–46.0)
HCT: 25 % — ABNORMAL LOW (ref 36.0–46.0)
HCT: 26 % — ABNORMAL LOW (ref 36.0–46.0)
HCT: 26 % — ABNORMAL LOW (ref 36.0–46.0)
HCT: 27 % — ABNORMAL LOW (ref 36.0–46.0)
HCT: 28 % — ABNORMAL LOW (ref 36.0–46.0)
HCT: 30 % — ABNORMAL LOW (ref 36.0–46.0)
HCT: 26 % — ABNORMAL LOW (ref 36.0–46.0)
Hemoglobin: 11.6 g/dL — ABNORMAL LOW (ref 12.0–15.0)
Hemoglobin: 8.5 g/dL — ABNORMAL LOW (ref 12.0–15.0)
Hemoglobin: 8.8 g/dL — ABNORMAL LOW (ref 12.0–15.0)
Hemoglobin: 8.8 g/dL — ABNORMAL LOW (ref 12.0–15.0)
Hemoglobin: 9.2 g/dL — ABNORMAL LOW (ref 12.0–15.0)
Hemoglobin: 9.5 g/dL — ABNORMAL LOW (ref 12.0–15.0)
Hemoglobin: 10.2 g/dL — ABNORMAL LOW (ref 12.0–15.0)
Hemoglobin: 8.8 g/dL — ABNORMAL LOW (ref 12.0–15.0)
O2 Saturation: 100 %
O2 Saturation: 100 %
O2 Saturation: 76 %
O2 Saturation: 100 %
O2 Saturation: 100 %
O2 Saturation: 98 %
O2 Saturation: 93 %
O2 Saturation: 93 %
Patient temperature: 36.3
Patient temperature: 37.4
Patient temperature: 37.5
Potassium: 4.2 mmol/L (ref 3.5–5.1)
Potassium: 3.8 mmol/L (ref 3.5–5.1)
Potassium: 3.9 mmol/L (ref 3.5–5.1)
Potassium: 4.5 mmol/L (ref 3.5–5.1)
Potassium: 4.2 mmol/L (ref 3.5–5.1)
Potassium: 3.5 mmol/L (ref 3.5–5.1)
Potassium: 4.3 mmol/L (ref 3.5–5.1)
Potassium: 4.1 mmol/L (ref 3.5–5.1)
Sodium: 141 mmol/L (ref 135–145)
Sodium: 141 mmol/L (ref 135–145)
Sodium: 142 mmol/L (ref 135–145)
Sodium: 141 mmol/L (ref 135–145)
Sodium: 142 mmol/L (ref 135–145)
Sodium: 144 mmol/L (ref 135–145)
Sodium: 142 mmol/L (ref 135–145)
Sodium: 143 mmol/L (ref 135–145)
TCO2: 29 mmol/L (ref 22–32)
TCO2: 27 mmol/L (ref 22–32)
TCO2: 30 mmol/L (ref 22–32)
TCO2: 29 mmol/L (ref 22–32)
TCO2: 26 mmol/L (ref 22–32)
TCO2: 23 mmol/L (ref 22–32)
TCO2: 22 mmol/L (ref 22–32)
TCO2: 22 mmol/L (ref 22–32)
pCO2 arterial: 54.1 mmHg — ABNORMAL HIGH (ref 32.0–48.0)
pCO2 arterial: 39.3 mmHg (ref 32.0–48.0)
pCO2 arterial: 39.8 mmHg (ref 32.0–48.0)
pCO2 arterial: 38.1 mmHg (ref 32.0–48.0)
pCO2 arterial: 37.7 mmHg (ref 32.0–48.0)
pCO2 arterial: 35.8 mmHg (ref 32.0–48.0)
pCO2 arterial: 40.6 mmHg (ref 32.0–48.0)
pCO2 arterial: 42.8 mmHg (ref 32.0–48.0)
pH, Arterial: 7.314 — ABNORMAL LOW (ref 7.350–7.450)
pH, Arterial: 7.431 (ref 7.350–7.450)
pH, Arterial: 7.462 — ABNORMAL HIGH (ref 7.350–7.450)
pH, Arterial: 7.465 — ABNORMAL HIGH (ref 7.350–7.450)
pH, Arterial: 7.431 (ref 7.350–7.450)
pH, Arterial: 7.397 (ref 7.350–7.450)
pH, Arterial: 7.317 — ABNORMAL LOW (ref 7.350–7.450)
pH, Arterial: 7.302 — ABNORMAL LOW (ref 7.350–7.450)
pO2, Arterial: 466 mmHg — ABNORMAL HIGH (ref 83.0–108.0)
pO2, Arterial: 39 mmHg — CL (ref 83.0–108.0)
pO2, Arterial: 426 mmHg — ABNORMAL HIGH (ref 83.0–108.0)
pO2, Arterial: 359 mmHg — ABNORMAL HIGH (ref 83.0–108.0)
pO2, Arterial: 207 mmHg — ABNORMAL HIGH (ref 83.0–108.0)
pO2, Arterial: 109 mmHg — ABNORMAL HIGH (ref 83.0–108.0)
pO2, Arterial: 73 mmHg — ABNORMAL LOW (ref 83.0–108.0)
pO2, Arterial: 74 mmHg — ABNORMAL LOW (ref 83.0–108.0)

## 2020-10-26 LAB — CBC
HCT: 36 % (ref 36.0–46.0)
HCT: 30.7 % — ABNORMAL LOW (ref 36.0–46.0)
HCT: 28.7 % — ABNORMAL LOW (ref 36.0–46.0)
Hemoglobin: 12.1 g/dL (ref 12.0–15.0)
Hemoglobin: 9.8 g/dL — ABNORMAL LOW (ref 12.0–15.0)
Hemoglobin: 9.6 g/dL — ABNORMAL LOW (ref 12.0–15.0)
MCH: 32.5 pg (ref 26.0–34.0)
MCH: 31.7 pg (ref 26.0–34.0)
MCH: 33 pg (ref 26.0–34.0)
MCHC: 33.6 g/dL (ref 30.0–36.0)
MCHC: 31.9 g/dL (ref 30.0–36.0)
MCHC: 33.4 g/dL (ref 30.0–36.0)
MCV: 96.8 fL (ref 80.0–100.0)
MCV: 99.4 fL (ref 80.0–100.0)
MCV: 98.6 fL (ref 80.0–100.0)
Platelets: 209 10*3/uL (ref 150–400)
Platelets: 140 10*3/uL — ABNORMAL LOW (ref 150–400)
Platelets: 154 10*3/uL (ref 150–400)
RBC: 3.72 MIL/uL — ABNORMAL LOW (ref 3.87–5.11)
RBC: 3.09 MIL/uL — ABNORMAL LOW (ref 3.87–5.11)
RBC: 2.91 MIL/uL — ABNORMAL LOW (ref 3.87–5.11)
RDW: 12.1 % (ref 11.5–15.5)
RDW: 12.2 % (ref 11.5–15.5)
RDW: 12.3 % (ref 11.5–15.5)
WBC: 6 10*3/uL (ref 4.0–10.5)
WBC: 10.1 10*3/uL (ref 4.0–10.5)
WBC: 11 10*3/uL — ABNORMAL HIGH (ref 4.0–10.5)
nRBC: 0 % (ref 0.0–0.2)
nRBC: 0 % (ref 0.0–0.2)
nRBC: 0 % (ref 0.0–0.2)

## 2020-10-26 LAB — GLUCOSE, CAPILLARY
Glucose-Capillary: 105 mg/dL — ABNORMAL HIGH (ref 70–99)
Glucose-Capillary: 102 mg/dL — ABNORMAL HIGH (ref 70–99)
Glucose-Capillary: 103 mg/dL — ABNORMAL HIGH (ref 70–99)
Glucose-Capillary: 127 mg/dL — ABNORMAL HIGH (ref 70–99)
Glucose-Capillary: 144 mg/dL — ABNORMAL HIGH (ref 70–99)
Glucose-Capillary: 148 mg/dL — ABNORMAL HIGH (ref 70–99)
Glucose-Capillary: 119 mg/dL — ABNORMAL HIGH (ref 70–99)
Glucose-Capillary: 130 mg/dL — ABNORMAL HIGH (ref 70–99)
Glucose-Capillary: 123 mg/dL — ABNORMAL HIGH (ref 70–99)
Glucose-Capillary: 149 mg/dL — ABNORMAL HIGH (ref 70–99)
Glucose-Capillary: 158 mg/dL — ABNORMAL HIGH (ref 70–99)
Glucose-Capillary: 99 mg/dL (ref 70–99)

## 2020-10-26 LAB — ECHO INTRAOPERATIVE TEE
AV Mean grad: 4 mmHg
Height: 64 in
Weight: 3176 oz

## 2020-10-26 LAB — SURGICAL PCR SCREEN
MRSA, PCR: NEGATIVE
Staphylococcus aureus: NEGATIVE

## 2020-10-26 LAB — POCT I-STAT, CHEM 8
BUN: 8 mg/dL (ref 8–23)
BUN: 13 mg/dL (ref 8–23)
BUN: 13 mg/dL (ref 8–23)
BUN: 12 mg/dL (ref 8–23)
BUN: 12 mg/dL (ref 8–23)
Calcium, Ion: 1.32 mmol/L (ref 1.15–1.40)
Calcium, Ion: 1.25 mmol/L (ref 1.15–1.40)
Calcium, Ion: 1.12 mmol/L — ABNORMAL LOW (ref 1.15–1.40)
Calcium, Ion: 1.13 mmol/L — ABNORMAL LOW (ref 1.15–1.40)
Calcium, Ion: 1.37 mmol/L (ref 1.15–1.40)
Chloride: 126 mmol/L — ABNORMAL HIGH (ref 98–111)
Chloride: 105 mmol/L (ref 98–111)
Chloride: 106 mmol/L (ref 98–111)
Chloride: 106 mmol/L (ref 98–111)
Chloride: 107 mmol/L (ref 98–111)
Creatinine, Ser: 0.7 mg/dL (ref 0.44–1.00)
Creatinine, Ser: 0.5 mg/dL (ref 0.44–1.00)
Creatinine, Ser: 0.5 mg/dL (ref 0.44–1.00)
Creatinine, Ser: 0.4 mg/dL — ABNORMAL LOW (ref 0.44–1.00)
Creatinine, Ser: 0.5 mg/dL (ref 0.44–1.00)
Glucose, Bld: 102 mg/dL — ABNORMAL HIGH (ref 70–99)
Glucose, Bld: 118 mg/dL — ABNORMAL HIGH (ref 70–99)
Glucose, Bld: 92 mg/dL (ref 70–99)
Glucose, Bld: 113 mg/dL — ABNORMAL HIGH (ref 70–99)
Glucose, Bld: 105 mg/dL — ABNORMAL HIGH (ref 70–99)
HCT: 33 % — ABNORMAL LOW (ref 36.0–46.0)
HCT: 30 % — ABNORMAL LOW (ref 36.0–46.0)
HCT: 27 % — ABNORMAL LOW (ref 36.0–46.0)
HCT: 27 % — ABNORMAL LOW (ref 36.0–46.0)
HCT: 25 % — ABNORMAL LOW (ref 36.0–46.0)
Hemoglobin: 11.2 g/dL — ABNORMAL LOW (ref 12.0–15.0)
Hemoglobin: 10.2 g/dL — ABNORMAL LOW (ref 12.0–15.0)
Hemoglobin: 9.2 g/dL — ABNORMAL LOW (ref 12.0–15.0)
Hemoglobin: 9.2 g/dL — ABNORMAL LOW (ref 12.0–15.0)
Hemoglobin: 8.5 g/dL — ABNORMAL LOW (ref 12.0–15.0)
Potassium: 4.1 mmol/L (ref 3.5–5.1)
Potassium: 4 mmol/L (ref 3.5–5.1)
Potassium: 4.4 mmol/L (ref 3.5–5.1)
Potassium: 5 mmol/L (ref 3.5–5.1)
Potassium: 4.1 mmol/L (ref 3.5–5.1)
Sodium: 141 mmol/L (ref 135–145)
Sodium: 139 mmol/L (ref 135–145)
Sodium: 140 mmol/L (ref 135–145)
Sodium: 140 mmol/L (ref 135–145)
Sodium: 141 mmol/L (ref 135–145)
TCO2: 27 mmol/L (ref 22–32)
TCO2: 25 mmol/L (ref 22–32)
TCO2: 27 mmol/L (ref 22–32)
TCO2: 26 mmol/L (ref 22–32)
TCO2: 25 mmol/L (ref 22–32)

## 2020-10-26 LAB — BASIC METABOLIC PANEL
Anion gap: 9 (ref 5–15)
Anion gap: 9 (ref 5–15)
BUN: 15 mg/dL (ref 8–23)
BUN: 13 mg/dL (ref 8–23)
CO2: 24 mmol/L (ref 22–32)
CO2: 21 mmol/L — ABNORMAL LOW (ref 22–32)
Calcium: 8.8 mg/dL — ABNORMAL LOW (ref 8.9–10.3)
Calcium: 8.9 mg/dL (ref 8.9–10.3)
Chloride: 107 mmol/L (ref 98–111)
Chloride: 110 mmol/L (ref 98–111)
Creatinine, Ser: 0.7 mg/dL (ref 0.44–1.00)
Creatinine, Ser: 0.73 mg/dL (ref 0.44–1.00)
GFR, Estimated: >60 mL/min (ref >60)
GFR, Estimated: >60 mL/min (ref >60)
Glucose, Bld: 99 mg/dL (ref 70–99)
Glucose, Bld: 138 mg/dL — ABNORMAL HIGH (ref 70–99)
Potassium: 4 mmol/L (ref 3.5–5.1)
Potassium: 4.2 mmol/L (ref 3.5–5.1)
Sodium: 140 mmol/L (ref 135–145)
Sodium: 140 mmol/L (ref 135–145)

## 2020-10-26 LAB — MAGNESIUM: Magnesium: 2.7 mg/dL — ABNORMAL HIGH (ref 1.7–2.4)

## 2020-10-26 LAB — COMPREHENSIVE METABOLIC PANEL
ALT: 22 U/L (ref 0–44)
AST: 21 U/L (ref 15–41)
Albumin: 3.5 g/dL (ref 3.5–5.0)
Alkaline Phosphatase: 52 U/L (ref 38–126)
Anion gap: 9 (ref 5–15)
BUN: 13 mg/dL (ref 8–23)
CO2: 25 mmol/L (ref 22–32)
Calcium: 9 mg/dL (ref 8.9–10.3)
Chloride: 106 mmol/L (ref 98–111)
Creatinine, Ser: 0.75 mg/dL (ref 0.44–1.00)
GFR, Estimated: >60 mL/min (ref >60)
Glucose, Bld: 112 mg/dL — ABNORMAL HIGH (ref 70–99)
Potassium: 4.1 mmol/L (ref 3.5–5.1)
Sodium: 140 mmol/L (ref 135–145)
Total Bilirubin: 1 mg/dL (ref 0.3–1.2)
Total Protein: 6.1 g/dL — ABNORMAL LOW (ref 6.5–8.1)

## 2020-10-26 LAB — BLOOD GAS, ARTERIAL
Acid-base deficit: 0.9 mmol/L (ref 0.0–2.0)
Bicarbonate: 23.8 mmol/L (ref 20.0–28.0)
Drawn by: 270271
FIO2: 21
O2 Saturation: 95 %
Patient temperature: 37
pCO2 arterial: 42.8 mmHg (ref 32.0–48.0)
pH, Arterial: 7.363 (ref 7.350–7.450)
pO2, Arterial: 79.7 mmHg — ABNORMAL LOW (ref 83.0–108.0)

## 2020-10-26 LAB — PLATELET COUNT: Platelets: 175 10*3/uL (ref 150–400)

## 2020-10-26 LAB — PROTIME-INR
INR: 1.4 — ABNORMAL HIGH (ref 0.8–1.2)
Prothrombin Time: 16.9 seconds — ABNORMAL HIGH (ref 11.4–15.2)

## 2020-10-26 LAB — APTT: aPTT: 31 seconds (ref 24–36)

## 2020-10-26 LAB — HEMOGLOBIN AND HEMATOCRIT, BLOOD
HCT: 27.9 % — ABNORMAL LOW (ref 36.0–46.0)
Hemoglobin: 9.1 g/dL — ABNORMAL LOW (ref 12.0–15.0)

## 2020-10-26 SURGERY — CORONARY ARTERY BYPASS GRAFTING (CABG)
Anesthesia: General | Site: Chest

## 2020-10-26 MED ORDER — TRAMADOL HCL 50 MG PO TABS
50.0000 mg | ORAL_TABLET | ORAL | Status: DC | PRN
  Administered 2020-10-27: 50 mg via ORAL
  Administered 2020-10-27 – 2020-10-29 (×2): 100 mg via ORAL
  Filled 2020-10-26: qty 1
  Filled 2020-10-26 (×2): qty 2

## 2020-10-26 MED ORDER — SODIUM CHLORIDE 0.9% FLUSH
3.0000 mL | Freq: Two times a day (BID) | INTRAVENOUS | Status: DC
  Administered 2020-10-27 – 2020-10-30 (×8): 3 mL via INTRAVENOUS

## 2020-10-26 MED ORDER — MIDAZOLAM HCL 5 MG/5ML IJ SOLN
INTRAMUSCULAR | Status: DC | PRN
  Administered 2020-10-26: 3 mg via INTRAVENOUS
  Administered 2020-10-26: 1 mg via INTRAVENOUS
  Administered 2020-10-26: 2 mg via INTRAVENOUS
  Administered 2020-10-26: 1 mg via INTRAVENOUS
  Administered 2020-10-26: 3 mg via INTRAVENOUS

## 2020-10-26 MED ORDER — ROCURONIUM BROMIDE 10 MG/ML (PF) SYRINGE
PREFILLED_SYRINGE | INTRAVENOUS | Status: DC | PRN
  Administered 2020-10-26: 20 mg via INTRAVENOUS
  Administered 2020-10-26: 50 mg via INTRAVENOUS
  Administered 2020-10-26: 100 mg via INTRAVENOUS

## 2020-10-26 MED ORDER — VANCOMYCIN HCL IN DEXTROSE 1-5 GM/200ML-% IV SOLN
1000.0000 mg | Freq: Once | INTRAVENOUS | Status: AC
  Administered 2020-10-26: 1000 mg via INTRAVENOUS
  Filled 2020-10-26: qty 200

## 2020-10-26 MED ORDER — ACETAMINOPHEN 650 MG RE SUPP
650.0000 mg | Freq: Once | RECTAL | Status: AC
  Administered 2020-10-26: 650 mg via RECTAL

## 2020-10-26 MED ORDER — PROTAMINE SULFATE 10 MG/ML IV SOLN
INTRAVENOUS | Status: DC | PRN
  Administered 2020-10-26: 300 mg via INTRAVENOUS
  Administered 2020-10-26: 20 mg via INTRAVENOUS

## 2020-10-26 MED ORDER — FAMOTIDINE IN NACL 20-0.9 MG/50ML-% IV SOLN
20.0000 mg | Freq: Two times a day (BID) | INTRAVENOUS | Status: AC
  Administered 2020-10-26 (×2): 20 mg via INTRAVENOUS
  Filled 2020-10-26: qty 50

## 2020-10-26 MED ORDER — MIDAZOLAM HCL 2 MG/2ML IJ SOLN
2.0000 mg | INTRAMUSCULAR | Status: DC | PRN
  Administered 2020-10-26: 2 mg via INTRAVENOUS
  Filled 2020-10-26: qty 2

## 2020-10-26 MED ORDER — CALCIUM CHLORIDE 10 % IV SOLN
INTRAVENOUS | Status: DC | PRN
  Administered 2020-10-26: 500 mg via INTRAVENOUS

## 2020-10-26 MED ORDER — PROPOFOL 10 MG/ML IV BOLUS
INTRAVENOUS | Status: DC | PRN
Start: 2020-10-26 — End: 2020-10-26
  Administered 2020-10-26: 50 mg via INTRAVENOUS

## 2020-10-26 MED ORDER — BISACODYL 5 MG PO TBEC
10.0000 mg | DELAYED_RELEASE_TABLET | Freq: Every day | ORAL | Status: DC
  Administered 2020-10-27 – 2020-10-31 (×3): 10 mg via ORAL
  Filled 2020-10-26 (×4): qty 2

## 2020-10-26 MED ORDER — ORAL CARE MOUTH RINSE
15.0000 mL | Freq: Two times a day (BID) | OROMUCOSAL | Status: DC
  Administered 2020-10-27 – 2020-10-29 (×6): 15 mL via OROMUCOSAL

## 2020-10-26 MED ORDER — SODIUM CHLORIDE 0.9 % IV SOLN
1.5000 g | Freq: Two times a day (BID) | INTRAVENOUS | Status: AC
  Administered 2020-10-26 – 2020-10-28 (×4): 1.5 g via INTRAVENOUS
  Filled 2020-10-26 (×4): qty 1.5

## 2020-10-26 MED ORDER — LACTATED RINGERS IV SOLN: INTRAVENOUS | Status: DC

## 2020-10-26 MED ORDER — MAGNESIUM SULFATE 4 GM/100ML IV SOLN
4.0000 g | Freq: Once | INTRAVENOUS | Status: AC
  Administered 2020-10-26: 4 g via INTRAVENOUS
  Filled 2020-10-26: qty 100

## 2020-10-26 MED ORDER — LACTATED RINGERS IV SOLN: 500.0000 mL | Freq: Once | INTRAVENOUS | Status: DC | PRN

## 2020-10-26 MED ORDER — LACTATED RINGERS IV SOLN: INTRAVENOUS | Status: DC | PRN

## 2020-10-26 MED ORDER — FENTANYL CITRATE (PF) 250 MCG/5ML IJ SOLN
INTRAMUSCULAR | Status: AC
  Filled 2020-10-26: qty 10

## 2020-10-26 MED ORDER — ALBUMIN HUMAN 5 % IV SOLN
250.0000 mL | INTRAVENOUS | Status: AC | PRN
  Administered 2020-10-26 (×3): 12.5 g via INTRAVENOUS
  Filled 2020-10-26 (×2): qty 250

## 2020-10-26 MED ORDER — SODIUM CHLORIDE 0.45 % IV SOLN: INTRAVENOUS | Status: DC | PRN

## 2020-10-26 MED ORDER — METOPROLOL TARTRATE 5 MG/5ML IV SOLN: 2.5000 mg | INTRAVENOUS | Status: DC | PRN

## 2020-10-26 MED ORDER — SODIUM CHLORIDE 0.9 % IV SOLN: INTRAVENOUS | Status: DC

## 2020-10-26 MED ORDER — PLASMA-LYTE 148 IV SOLN
INTRAVENOUS | Status: DC | PRN
  Administered 2020-10-26: 500 mL via INTRAVASCULAR

## 2020-10-26 MED ORDER — HEPARIN SODIUM (PORCINE) 1000 UNIT/ML IJ SOLN
INTRAMUSCULAR | Status: AC
  Filled 2020-10-26: qty 1

## 2020-10-26 MED ORDER — PROPOFOL 10 MG/ML IV BOLUS
INTRAVENOUS | Status: AC
  Filled 2020-10-26: qty 20

## 2020-10-26 MED ORDER — 0.9 % SODIUM CHLORIDE (POUR BTL) OPTIME
TOPICAL | Status: DC | PRN
  Administered 2020-10-26: 5000 mL

## 2020-10-26 MED ORDER — ACETAMINOPHEN 500 MG PO TABS
1000.0000 mg | ORAL_TABLET | Freq: Four times a day (QID) | ORAL | Status: DC
  Administered 2020-10-27 – 2020-10-31 (×17): 1000 mg via ORAL
  Filled 2020-10-26 (×17): qty 2

## 2020-10-26 MED ORDER — SODIUM CHLORIDE 0.9 % IV SOLN: 250.0000 mL | INTRAVENOUS | Status: DC

## 2020-10-26 MED ORDER — ACETAMINOPHEN 160 MG/5ML PO SOLN: 1000.0000 mg | Freq: Four times a day (QID) | ORAL | Status: DC

## 2020-10-26 MED ORDER — CHLORHEXIDINE GLUCONATE CLOTH 2 % EX PADS
6.0000 | MEDICATED_PAD | Freq: Every day | CUTANEOUS | Status: DC
  Administered 2020-10-26 – 2020-10-29 (×4): 6 via TOPICAL

## 2020-10-26 MED ORDER — SODIUM CHLORIDE 0.9% FLUSH
10.0000 mL | Freq: Two times a day (BID) | INTRAVENOUS | Status: DC
  Administered 2020-10-27 – 2020-10-29 (×4): 10 mL

## 2020-10-26 MED ORDER — ROCURONIUM BROMIDE 10 MG/ML (PF) SYRINGE
PREFILLED_SYRINGE | INTRAVENOUS | Status: AC
  Filled 2020-10-26: qty 10

## 2020-10-26 MED ORDER — ALBUMIN HUMAN 5 % IV SOLN: INTRAVENOUS | Status: DC | PRN

## 2020-10-26 MED ORDER — OXYCODONE HCL 5 MG PO TABS
5.0000 mg | ORAL_TABLET | ORAL | Status: DC | PRN
Start: 2020-10-26 — End: 2020-10-31
  Administered 2020-10-27: 5 mg via ORAL
  Administered 2020-10-27 – 2020-10-28 (×2): 10 mg via ORAL
  Administered 2020-10-30: 5 mg via ORAL
  Filled 2020-10-26: qty 1
  Filled 2020-10-26 (×2): qty 2
  Filled 2020-10-26: qty 1

## 2020-10-26 MED ORDER — SODIUM CHLORIDE 0.9% FLUSH
10.0000 mL | INTRAVENOUS | Status: DC | PRN
Start: 2020-10-26 — End: 2020-10-31

## 2020-10-26 MED ORDER — SODIUM CHLORIDE 0.9% FLUSH: 3.0000 mL | INTRAVENOUS | Status: DC | PRN

## 2020-10-26 MED ORDER — METOPROLOL TARTRATE 12.5 MG HALF TABLET
12.5000 mg | ORAL_TABLET | Freq: Two times a day (BID) | ORAL | Status: DC
  Administered 2020-10-27 – 2020-10-31 (×7): 12.5 mg via ORAL
  Filled 2020-10-26 (×9): qty 1

## 2020-10-26 MED ORDER — CHLORHEXIDINE GLUCONATE 0.12 % MT SOLN
15.0000 mL | Freq: Two times a day (BID) | OROMUCOSAL | Status: DC
  Administered 2020-10-26 – 2020-10-31 (×7): 15 mL via OROMUCOSAL
  Filled 2020-10-26 (×8): qty 15

## 2020-10-26 MED ORDER — METOPROLOL TARTRATE 25 MG/10 ML ORAL SUSPENSION
12.5000 mg | Freq: Two times a day (BID) | ORAL | Status: DC
  Filled 2020-10-26: qty 5

## 2020-10-26 MED ORDER — DOCUSATE SODIUM 100 MG PO CAPS
200.0000 mg | ORAL_CAPSULE | Freq: Every day | ORAL | Status: DC
  Administered 2020-10-27 – 2020-10-31 (×4): 200 mg via ORAL
  Filled 2020-10-26 (×4): qty 2

## 2020-10-26 MED ORDER — PHENYLEPHRINE 40 MCG/ML (10ML) SYRINGE FOR IV PUSH (FOR BLOOD PRESSURE SUPPORT)
PREFILLED_SYRINGE | INTRAVENOUS | Status: AC
  Filled 2020-10-26: qty 10

## 2020-10-26 MED ORDER — MORPHINE SULFATE (PF) 2 MG/ML IV SOLN
1.0000 mg | INTRAVENOUS | Status: DC | PRN
  Administered 2020-10-26: 1 mg via INTRAVENOUS
  Filled 2020-10-26: qty 1

## 2020-10-26 MED ORDER — PHENYLEPHRINE 40 MCG/ML (10ML) SYRINGE FOR IV PUSH (FOR BLOOD PRESSURE SUPPORT)
PREFILLED_SYRINGE | INTRAVENOUS | Status: DC | PRN
  Administered 2020-10-26: 40 ug via INTRAVENOUS

## 2020-10-26 MED ORDER — ACETAMINOPHEN 160 MG/5ML PO SOLN: 650.0000 mg | Freq: Once | ORAL | Status: AC

## 2020-10-26 MED ORDER — INSULIN REGULAR(HUMAN) IN NACL 100-0.9 UT/100ML-% IV SOLN: INTRAVENOUS | Status: DC

## 2020-10-26 MED ORDER — NICARDIPINE HCL IN NACL 20-0.86 MG/200ML-% IV SOLN
0.0000 mg/h | INTRAVENOUS | Status: DC
  Filled 2020-10-26: qty 200

## 2020-10-26 MED ORDER — DOBUTAMINE IN D5W 4-5 MG/ML-% IV SOLN: 0.0000 ug/kg/min | INTRAVENOUS | Status: DC

## 2020-10-26 MED ORDER — NITROGLYCERIN IN D5W 200-5 MCG/ML-% IV SOLN: 0.0000 ug/min | INTRAVENOUS | Status: DC

## 2020-10-26 MED ORDER — CHLORHEXIDINE GLUCONATE 0.12 % MT SOLN
15.0000 mL | OROMUCOSAL | Status: AC
  Administered 2020-10-26: 15 mL via OROMUCOSAL

## 2020-10-26 MED ORDER — PANTOPRAZOLE SODIUM 40 MG PO TBEC
40.0000 mg | DELAYED_RELEASE_TABLET | Freq: Every day | ORAL | Status: DC
  Administered 2020-10-28 – 2020-10-31 (×4): 40 mg via ORAL
  Filled 2020-10-26 (×4): qty 1

## 2020-10-26 MED ORDER — ONDANSETRON HCL 4 MG/2ML IJ SOLN
4.0000 mg | Freq: Four times a day (QID) | INTRAMUSCULAR | Status: DC | PRN
  Administered 2020-10-27 – 2020-10-28 (×2): 4 mg via INTRAVENOUS
  Filled 2020-10-26 (×2): qty 2

## 2020-10-26 MED ORDER — BISACODYL 10 MG RE SUPP: 10.0000 mg | Freq: Every day | RECTAL | Status: DC

## 2020-10-26 MED ORDER — SODIUM CHLORIDE (PF) 0.9 % IJ SOLN
OROMUCOSAL | Status: DC | PRN
  Administered 2020-10-26 (×2): 4 mL via TOPICAL

## 2020-10-26 MED ORDER — ASPIRIN 81 MG PO CHEW
324.0000 mg | CHEWABLE_TABLET | Freq: Every day | ORAL | Status: DC
  Filled 2020-10-26: qty 4

## 2020-10-26 MED ORDER — DEXMEDETOMIDINE HCL IN NACL 400 MCG/100ML IV SOLN
0.0000 ug/kg/h | INTRAVENOUS | Status: DC
  Administered 2020-10-26: 0.7 ug/kg/h via INTRAVENOUS
  Filled 2020-10-26: qty 100

## 2020-10-26 MED ORDER — HEPARIN SODIUM (PORCINE) 1000 UNIT/ML IJ SOLN
INTRAMUSCULAR | Status: DC | PRN
  Administered 2020-10-26: 32000 [IU] via INTRAVENOUS

## 2020-10-26 MED ORDER — MIDAZOLAM HCL (PF) 10 MG/2ML IJ SOLN
INTRAMUSCULAR | Status: AC
  Filled 2020-10-26: qty 2

## 2020-10-26 MED ORDER — NOREPINEPHRINE 4 MG/250ML-% IV SOLN
0.0000 ug/min | INTRAVENOUS | Status: DC
  Administered 2020-10-26: 2 ug/min via INTRAVENOUS
  Filled 2020-10-26: qty 250

## 2020-10-26 MED ORDER — FENTANYL CITRATE (PF) 250 MCG/5ML IJ SOLN
INTRAMUSCULAR | Status: DC | PRN
  Administered 2020-10-26: 100 ug via INTRAVENOUS
  Administered 2020-10-26: 200 ug via INTRAVENOUS
  Administered 2020-10-26 (×3): 100 ug via INTRAVENOUS
  Administered 2020-10-26 (×3): 50 ug via INTRAVENOUS
  Administered 2020-10-26 (×2): 100 ug via INTRAVENOUS
  Administered 2020-10-26: 50 ug via INTRAVENOUS

## 2020-10-26 MED ORDER — POTASSIUM CHLORIDE 10 MEQ/50ML IV SOLN
10.0000 meq | INTRAVENOUS | Status: AC
  Administered 2020-10-26 (×3): 10 meq via INTRAVENOUS

## 2020-10-26 MED ORDER — DEXTROSE 50 % IV SOLN: 0.0000 mL | INTRAVENOUS | Status: DC | PRN

## 2020-10-26 MED ORDER — SODIUM CHLORIDE 0.9 % IV SOLN: INTRAVENOUS | Status: DC | PRN

## 2020-10-26 MED ORDER — ASPIRIN EC 325 MG PO TBEC
325.0000 mg | DELAYED_RELEASE_TABLET | Freq: Every day | ORAL | Status: DC
  Administered 2020-10-27 – 2020-10-31 (×5): 325 mg via ORAL
  Filled 2020-10-26 (×5): qty 1

## 2020-10-26 SURGICAL SUPPLY — 85 items
BAG DECANTER FOR FLEXI CONT (MISCELLANEOUS) ×3 IMPLANT
BLADE CLIPPER SURG (BLADE) ×3 IMPLANT
BLADE STERNUM SYSTEM 6 (BLADE) ×3 IMPLANT
BNDG ELASTIC 4X5.8 VLCR STR LF (GAUZE/BANDAGES/DRESSINGS) ×3 IMPLANT
BNDG ELASTIC 6X5.8 VLCR STR LF (GAUZE/BANDAGES/DRESSINGS) ×3 IMPLANT
BNDG GAUZE ELAST 4 BULKY (GAUZE/BANDAGES/DRESSINGS) ×3 IMPLANT
CABLE SURGICAL S-101-97-12 (CABLE) ×3 IMPLANT
CANISTER SUCT 3000ML PPV (MISCELLANEOUS) ×3 IMPLANT
CANISTER WOUNDNEG PRESSURE 500 (CANNISTER) ×3 IMPLANT
CANNULA MC2 2 STG 29/37 NON-V (CANNULA) ×2 IMPLANT
CANNULA MC2 TWO STAGE (CANNULA) ×1
CANNULA NON VENT 20FR 12 (CANNULA) ×3 IMPLANT
CATH ROBINSON RED A/P 18FR (CATHETERS) ×6 IMPLANT
CLIP RETRACTION 3.0MM CORONARY (MISCELLANEOUS) ×3 IMPLANT
CLIP VESOCCLUDE MED 24/CT (CLIP) IMPLANT
CLIP VESOCCLUDE SM WIDE 24/CT (CLIP) IMPLANT
CONN ST 1/2X1/2  BEN (MISCELLANEOUS) ×1
CONN ST 1/2X1/2 BEN (MISCELLANEOUS) ×2 IMPLANT
CONNECTOR BLAKE 2:1 CARIO BLK (MISCELLANEOUS) ×3 IMPLANT
DERMABOND ADVANCED (GAUZE/BANDAGES/DRESSINGS) ×1
DERMABOND ADVANCED .7 DNX12 (GAUZE/BANDAGES/DRESSINGS) ×2 IMPLANT
DRAIN CHANNEL 19F RND (DRAIN) ×6 IMPLANT
DRAIN CONNECTOR BLAKE 1:1 (MISCELLANEOUS) ×3 IMPLANT
DRAPE CARDIOVASCULAR INCISE (DRAPES) ×1
DRAPE INCISE IOBAN 66X45 STRL (DRAPES) IMPLANT
DRAPE SLUSH/WARMER DISC (DRAPES) ×3 IMPLANT
DRAPE SRG 135X102X78XABS (DRAPES) ×2 IMPLANT
DRESSING PEEL AND PLAC PRVNA20 (GAUZE/BANDAGES/DRESSINGS) ×2 IMPLANT
DRSG AQUACEL AG ADV 3.5X10 (GAUZE/BANDAGES/DRESSINGS) ×3 IMPLANT
DRSG COVADERM 4X14 (GAUZE/BANDAGES/DRESSINGS) ×3 IMPLANT
DRSG PEEL AND PLACE PREVENA 20 (GAUZE/BANDAGES/DRESSINGS) ×3
ELECT BLADE 4.0 EZ CLEAN MEGAD (MISCELLANEOUS) ×3
ELECT REM PT RETURN 9FT ADLT (ELECTROSURGICAL) ×6
ELECTRODE BLDE 4.0 EZ CLN MEGD (MISCELLANEOUS) ×2 IMPLANT
ELECTRODE REM PT RTRN 9FT ADLT (ELECTROSURGICAL) ×4 IMPLANT
FELT TEFLON 1X6 (MISCELLANEOUS) ×3 IMPLANT
GAUZE SPONGE 4X4 12PLY STRL (GAUZE/BANDAGES/DRESSINGS) ×6 IMPLANT
GAUZE SPONGE 4X4 12PLY STRL LF (GAUZE/BANDAGES/DRESSINGS) ×6 IMPLANT
GLOVE BIO SURGEON STRL SZ7 (GLOVE) ×3 IMPLANT
GLOVE BIOGEL M STRL SZ7.5 (GLOVE) ×6 IMPLANT
GOWN STRL REUS W/ TWL LRG LVL3 (GOWN DISPOSABLE) ×8 IMPLANT
GOWN STRL REUS W/ TWL XL LVL3 (GOWN DISPOSABLE) ×6 IMPLANT
GOWN STRL REUS W/TWL LRG LVL3 (GOWN DISPOSABLE) ×4
GOWN STRL REUS W/TWL XL LVL3 (GOWN DISPOSABLE) ×3
HEMOSTAT POWDER SURGIFOAM 1G (HEMOSTASIS) ×9 IMPLANT
INSERT SUTURE HOLDER (MISCELLANEOUS) ×3 IMPLANT
KIT BASIN OR (CUSTOM PROCEDURE TRAY) ×3 IMPLANT
KIT SUCTION CATH 14FR (SUCTIONS) ×3 IMPLANT
KIT TURNOVER KIT B (KITS) ×3 IMPLANT
KIT VASOVIEW HEMOPRO 2 VH 4000 (KITS) ×3 IMPLANT
LEAD PACING MYOCARDI (MISCELLANEOUS) ×3 IMPLANT
MARKER GRAFT CORONARY BYPASS (MISCELLANEOUS) ×9 IMPLANT
NS IRRIG 1000ML POUR BTL (IV SOLUTION) ×15 IMPLANT
PACK ACCESSORY CANNULA KIT (KITS) ×3 IMPLANT
PACK E OPEN HEART (SUTURE) ×3 IMPLANT
PACK OPEN HEART (CUSTOM PROCEDURE TRAY) ×3 IMPLANT
PAD ARMBOARD 7.5X6 YLW CONV (MISCELLANEOUS) ×6 IMPLANT
PAD ELECT DEFIB RADIOL ZOLL (MISCELLANEOUS) ×3 IMPLANT
PENCIL BUTTON HOLSTER BLD 10FT (ELECTRODE) ×3 IMPLANT
POSITIONER HEAD DONUT 9IN (MISCELLANEOUS) ×3 IMPLANT
PUNCH AORTIC ROTATE 4.0MM (MISCELLANEOUS) ×3 IMPLANT
SET CARDIOPLEGIA MPS 5001102 (MISCELLANEOUS) ×3 IMPLANT
SUPPORT HEART JANKE-BARRON (MISCELLANEOUS) ×3 IMPLANT
SUT BONE WAX W31G (SUTURE) ×3 IMPLANT
SUT ETHIBOND X763 2 0 SH 1 (SUTURE) ×6 IMPLANT
SUT MNCRL AB 3-0 PS2 18 (SUTURE) ×6 IMPLANT
SUT PDS AB 1 CTX 36 (SUTURE) ×6 IMPLANT
SUT PROLENE 4 0 SH DA (SUTURE) ×3 IMPLANT
SUT PROLENE 5 0 C 1 36 (SUTURE) ×9 IMPLANT
SUT PROLENE 7 0 BV1 MDA (SUTURE) ×6 IMPLANT
SUT STEEL 6MS V (SUTURE) ×6 IMPLANT
SUT STEEL STERNAL CCS#1 18IN (SUTURE) ×9 IMPLANT
SUT VIC AB 1 CTX 36 (SUTURE) ×2
SUT VIC AB 1 CTX36XBRD ANBCTR (SUTURE) ×4 IMPLANT
SUT VIC AB 2-0 CT1 27 (SUTURE) ×1
SUT VIC AB 2-0 CT1 TAPERPNT 27 (SUTURE) ×2 IMPLANT
SUT VIC AB 3-0 X1 27 (SUTURE) ×3 IMPLANT
SYSTEM SAHARA CHEST DRAIN ATS (WOUND CARE) ×3 IMPLANT
TAPE CLOTH SURG 4X10 WHT LF (GAUZE/BANDAGES/DRESSINGS) ×3 IMPLANT
TOWEL GREEN STERILE (TOWEL DISPOSABLE) ×3 IMPLANT
TOWEL GREEN STERILE FF (TOWEL DISPOSABLE) ×3 IMPLANT
TRAY FOLEY SLVR 16FR TEMP STAT (SET/KITS/TRAYS/PACK) ×3 IMPLANT
TUBING LAP HI FLOW INSUFFLATIO (TUBING) ×3 IMPLANT
UNDERPAD 30X36 HEAVY ABSORB (UNDERPADS AND DIAPERS) ×3 IMPLANT
WATER STERILE IRR 1000ML POUR (IV SOLUTION) ×6 IMPLANT

## 2020-10-26 NOTE — Anesthesia Procedure Notes (Addendum)
Central Venous Catheter Insertion Performed by: Nolon Nations, MD, anesthesiologist Start/End2/11/2020 7:00 AM, 10/26/2020 7:15 AM Patient location: Pre-op. Preanesthetic checklist: patient identified, IV checked, site marked, risks and benefits discussed, surgical consent, monitors and equipment checked, pre-op evaluation, timeout performed and anesthesia consent Position: Trendelenburg Lidocaine 1% used for infiltration and patient sedated Hand hygiene performed  and maximum sterile barriers used  Catheter size: 8.5 Fr Sheath introducer Procedure performed using ultrasound guided technique. Ultrasound Notes:anatomy identified, needle tip was noted to be adjacent to the nerve/plexus identified, no ultrasound evidence of intravascular and/or intraneural injection and image(s) printed for medical record Attempts: 1 Following insertion, line sutured, dressing applied and Biopatch. Post procedure assessment: blood return through all ports, free fluid flow and no air  Patient tolerated the procedure well with no immediate complications.

## 2020-10-26 NOTE — Hospital Course (Addendum)
    History of Present Illness:     65 year old female presents for an elective left heart cath and was found to have two-vessel coronary artery disease.  She has a long history of diabetes and strong family history of coronary artery disease, and has been proactive in regards to regular screenings.  Over the last several months she has noticed some early fatigue and exertional dyspnea.  She has had some occasional chest pressure.  After speaking with her cardiologist she underwent further evaluation, and had her left heart cath today.  She is currently asymptomatic.   Hospital course: The patient was seen in cardiothoracic surgical consultation by Dr. Kipp Brood who evaluated the patient and all relevant studies and recommended proceeding with CABG.  On 10/26/2020 she was taken the operating room where she underwent the below described procedure.  She tolerated it well and was taken to the surgical ICU in stable condition.  10/26/2020 Patient:  Jamie Kato Pre-Op Dx:     Left main coronary artery disease                         Diabetes mellitus                         Morbid obesity Post-op Dx: Same Procedure: CABG X 4.  LIMA to LAD, reverse saphenous vein graft to OM 3, ramus intermedius, and first diagonal Endoscopic greater saphenous vein harvest on the right     Surgeon and Role:      * Lightfoot, Lucile Crater, MD - Primary    Evonnie Pat, PA-C- assisting Anesthesia  general EBL: 500 ml Blood Administration: None Xclamp Time:  68 min Pump Time:  172min    Postoperative hospital course:  The patient has remained neurologically stable with good pain control.  Initially she did require some inotropic support and has also been started on midodrine to assist with blood pressure management.  Early in the hospital course the beta-blocker is placed on hold.  The patient has been started on statin as well as aspirin.  Chest tubes were kept in place on postoperative day #1.  Renal function has  remained stable with good urine output.

## 2020-10-26 NOTE — Op Note (Signed)
[address]       [place] [address]             [phone]                                          10/26/2020 Patient:  Sue Baker Pre-Op Dx:  Left main coronary artery disease   Diabetes mellitus   Morbid obesity Post-op Dx: Same Procedure: CABG X 4.  LIMA to LAD, reverse saphenous vein graft to OM 3, ramus intermedius, and first diagonal Endoscopic greater saphenous vein harvest on the right   Surgeon and Role:      * Celester Morgan, Lucile Crater, MD - Primary    Evonnie Pat, PA-C- assisting Anesthesia  general EBL: 500 ml Blood Administration: None Xclamp Time:  68 min Pump Time:  191min  Drains: 27 F blake drain: L, mediastinal  Wires: None Counts: correct   Indications: 65 year old with history of diabetes and strong family history of coronary artery disease presents with a left heart cath which did show significant stenosis.   Findings: Good preoperative function on transesophageal echocardiogram.  Good LIMA and vein conduit.  Very small obtuse marginal but good flows.  The LAD was heavily calcified proximally but we were able to find a good target distally.  The diagonal branch is also heavily calcified.  The intermedius ramus branch was a good sized target.  Operative Technique: All invasive lines were placed in pre-op holding.  After the risks, benefits and alternatives were thoroughly discussed, the patient was brought to the operative theatre.  Anesthesia was induced, and the patient was prepped and draped in normal sterile fashion.  An appropriate surgical pause was performed, and pre-operative antibiotics were dosed accordingly.  We began with simultaneous incisions along the right leg for harvesting of the greater saphenous vein and the chest for the sternotomy.  In regards to the sternotomy, this was carried down with bovie cautery, and the sternum was divided with a reciprocating saw.  Meticulous hemostasis was obtained.  The left internal  thoracic artery was exposed and harvested in in pedicled fashion.  The patient was systemically heparinized, and the artery was divided distally, and placed in a papaverine sponge.    The sternal elevator was removed, and a retractor was placed.  The pericardium was divided in the midline and fashioned into a cradle with pericardial stitches.   After we confirmed an appropriate ACT, the ascending aorta was cannulated in standard fashion.  The right atrial appendage was used for venous cannulation site.  Cardiopulmonary bypass was initiated, and the heart retractor was placed. The cross clamp was applied, and a dose of anterograde cardioplegia was given with good arrest of the heart.  Next we exposed the lateral wall, and found a good target on the obtuse marginal.  An end to side anastomosis with the vein graft was then created.  Next, we exposed the anterior wall of the heart and identified a good target on ramus intermedius.   An arteriotomy was created.  The vein was anastomosed in an end to side fashion.  We then moved to the first diagonal branch and created an arteriotomy and anastomosed the vein graft to it as well.  Finally, we exposed a good target on the LAD, and fashioned an end to side anastomosis between it and the LITA.  We began  to re-warm, and a re-animation dose of cardioplegia was given.  The heart was de-aired, and the cross clamp was removed.  Meticulous hemostasis was obtained.    A partial occludding clamp was then placed on the ascending aorta, and we created an end to side anastomosis between it and the proximal vein grafts.  The proximal sites were marked with rings.  Hemostasis was obtained, and we separated from cardiopulmonary bypass without event.the heparin was reversed with protamine.  Chest tubes and wires were placed, and the sternum was re-approximated with with sternal wires.  The soft tissue and skin were re-approximated wth absorbable suture.    The patient tolerated the  procedure without any immediate complications, and was transferred to the ICU in guarded condition.  Velicia Dejager Bary Leriche

## 2020-10-26 NOTE — OR Nursing (Signed)
Twenty minute call to 2 Heart Charge nurse at 1236. Spoke to [place]. Cath Lab also notified of timing.

## 2020-10-26 NOTE — Progress Notes (Signed)
  Echocardiogram Echocardiogram Transesophageal has been performed.  Fidel Levy 10/26/2020, 1:16 PM

## 2020-10-26 NOTE — OR Nursing (Signed)
Forty-five minute call to 2 Heart Charge Nurse at 1153. Spoke to [place].

## 2020-10-26 NOTE — Plan of Care (Signed)

## 2020-10-26 NOTE — Procedures (Signed)
Extubation Procedure Note  Patient Details:   Name: Sue Baker DOB: 1955-09-28 MRN: [id number]   Airway Documentation:    Vent end date: 10/26/20 Vent end time: 1710   Evaluation  O2 sats: stable throughout Complications: No apparent complications Patient did tolerate procedure well. Bilateral Breath Sounds: Clear,Diminished   Yes   Patient extubated per protocol and order by RT [name] with no complications. Positive cuff leak was noted prior to extubation. Patient achieved NIF of -30 and VC of .9L with good patient effort. Vitals are stable. RT will continue to monitor.  [name] [name] 10/26/2020, 6:17 PM

## 2020-10-26 NOTE — Brief Op Note (Signed)
10/25/2020 - 10/26/2020  3:22 PM  PATIENT:  Sue Baker  65 y.o. female  PRE-OPERATIVE DIAGNOSIS:  coronary artery disease  POST-OPERATIVE DIAGNOSIS:  coronary artery disease  PROCEDURE:  Procedure(s) with comments: CORONARY ARTERY BYPASS GRAFTING (CABG), ON PUMP, TIMES FOUR, USING LEFT INTERNAL MAMMARY ARTERY AND ENDOSCOPICALLY HARVESTED RIGHT GREATER SAPHENOUS VEIN (N/A) - flow trac TRANSESOPHAGEAL ECHOCARDIOGRAM (TEE) (N/A) LIMA-LAD SVG-OM3 SVG-DIAG SVG-RAMUS EVH 65 MINUTES SURGEON:  Surgeon(s) and Role:    * Lightfoot, Lucile Crater, MD - Primary  PHYSICIAN ASSISTANT: Cherrish Vitali PA-C  ANESTHESIA:   general  EBL:  540 mL   BLOOD ADMINISTERED:none  DRAINS: LEFT PLEURAL AND MEDIASTINAL CHEST TUBES   LOCAL MEDICATIONS USED:  NONE  SPECIMEN:  No Specimen  DISPOSITION OF SPECIMEN:  N/A  COUNTS:  YES  TOURNIQUET:  * No tourniquets in log *  DICTATION: .Dragon Dictation  PLAN OF CARE: Admit to inpatient   PATIENT DISPOSITION:  ICU - intubated and hemodynamically stable.   Delay start of Pharmacological VTE agent (>24hrs) due to surgical blood loss or risk of bleeding: yes  COMPLICATIONS: NO KNOWN

## 2020-10-26 NOTE — Transfer of Care (Signed)
Immediate Anesthesia Transfer of Care Note  Patient: Sue Baker  Procedure(s) Performed: CORONARY ARTERY BYPASS GRAFTING (CABG), ON PUMP, TIMES FOUR, USING LEFT INTERNAL MAMMARY ARTERY AND ENDOSCOPICALLY HARVESTED RIGHT GREATER SAPHENOUS VEIN (N/A Chest) TRANSESOPHAGEAL ECHOCARDIOGRAM (TEE) (N/A )  Patient Location: SICU  Anesthesia Type:General  Level of Consciousness: sedated and Patient remains intubated per anesthesia plan  Airway & Oxygen Therapy: Patient remains intubated per anesthesia plan and Patient placed on Ventilator (see vital sign flow sheet for setting)  Post-op Assessment: Report given to RN and Post -op Vital signs reviewed and stable  Post vital signs: Reviewed and stable  Last Vitals:  Vitals Value Taken Time  BP 124/72 10/26/20 1309  Temp 36.3 C 10/26/20 1317  Pulse 71 10/26/20 1317  Resp 12 10/26/20 1317  SpO2 99 % 10/26/20 1317  Vitals shown include unvalidated device data.  Last Pain:  Vitals:   10/26/20 0504  TempSrc: Oral  PainSc:       Patients Stated Pain Goal: 3 (59/93/57 0177)  Complications: No complications documented.

## 2020-10-26 NOTE — Anesthesia Procedure Notes (Signed)
Arterial Line Insertion Start/End2/11/2020 7:02 AM, 10/26/2020 7:05 AM Performed by: Verdie Drown, CRNA, CRNA  Patient location: Pre-op. Preanesthetic checklist: patient identified, IV checked, site marked, risks and benefits discussed, surgical consent, monitors and equipment checked, pre-op evaluation, timeout performed and anesthesia consent Lidocaine 1% used for infiltration and patient sedated Left, radial was placed Catheter size: 20 G Hand hygiene performed , maximum sterile barriers used  and Seldinger technique used Allen's test indicative of satisfactory collateral circulation Attempts: 1 Procedure performed without using ultrasound guided technique. Following insertion, dressing applied and Biopatch. Post procedure assessment: normal  Patient tolerated the procedure well with no immediate complications.

## 2020-10-26 NOTE — Progress Notes (Signed)
     [address]       [place] [address]             [phone]       No events  Vitals:   10/26/20 0504 10/26/20 0546  BP: 113/63   Pulse: 60 65  Resp: 18   Temp: 98.5 F (36.9 C)   SpO2: 98%    Alert NAD  sinus EWOB  OR today for [place]

## 2020-10-26 NOTE — Progress Notes (Signed)
      [address]       [place] [address]             [phone]      Extubated  BP (!) 94/58   Pulse 66   Temp 99.32 F (37.4 C)   Resp (!) 21   Ht 5\' 4"  (1.626 m)   Wt 90 kg Comment: scale c  SpO2 97%   BMI 34.07 kg/m   Intake/Output Summary (Last 24 hours) at 10/26/2020 1731 Last data filed at 10/26/2020 1610 Gross per 24 hour  Intake 6023.92 ml  Output 4440 ml  Net 1583.92 ml   Receiving volume now for low BP, low CVP. CI= 3.0 Hct= 30  Doing well s/p CABG  Remo Lipps C. Roxan Hockey, MD [facility] [phone]

## 2020-10-27 ENCOUNTER — Encounter (HOSPITAL_COMMUNITY): Payer: Self-pay | Admitting: Thoracic Surgery (Cardiothoracic Vascular Surgery)

## 2020-10-27 ENCOUNTER — Inpatient Hospital Stay (HOSPITAL_COMMUNITY): Payer: 59

## 2020-10-27 LAB — BASIC METABOLIC PANEL
Anion gap: 13 (ref 5–15)
Anion gap: 9 (ref 5–15)
BUN: 12 mg/dL (ref 8–23)
BUN: 13 mg/dL (ref 8–23)
CO2: 20 mmol/L — ABNORMAL LOW (ref 22–32)
CO2: 24 mmol/L (ref 22–32)
Calcium: 8.9 mg/dL (ref 8.9–10.3)
Calcium: 8.9 mg/dL (ref 8.9–10.3)
Chloride: 106 mmol/L (ref 98–111)
Chloride: 104 mmol/L (ref 98–111)
Creatinine, Ser: 0.76 mg/dL (ref 0.44–1.00)
Creatinine, Ser: 0.63 mg/dL (ref 0.44–1.00)
GFR, Estimated: >60 mL/min (ref >60)
GFR, Estimated: >60 mL/min (ref >60)
Glucose, Bld: 135 mg/dL — ABNORMAL HIGH (ref 70–99)
Glucose, Bld: 96 mg/dL (ref 70–99)
Potassium: 3.9 mmol/L (ref 3.5–5.1)
Potassium: 4.1 mmol/L (ref 3.5–5.1)
Sodium: 139 mmol/L (ref 135–145)
Sodium: 137 mmol/L (ref 135–145)

## 2020-10-27 LAB — CBC
HCT: 30.7 % — ABNORMAL LOW (ref 36.0–46.0)
HCT: 28.3 % — ABNORMAL LOW (ref 36.0–46.0)
HCT: 28.6 % — ABNORMAL LOW (ref 36.0–46.0)
Hemoglobin: 9.9 g/dL — ABNORMAL LOW (ref 12.0–15.0)
Hemoglobin: 9.5 g/dL — ABNORMAL LOW (ref 12.0–15.0)
Hemoglobin: 9.1 g/dL — ABNORMAL LOW (ref 12.0–15.0)
MCH: 31.8 pg (ref 26.0–34.0)
MCH: 32.8 pg (ref 26.0–34.0)
MCH: 32 pg (ref 26.0–34.0)
MCHC: 32.2 g/dL (ref 30.0–36.0)
MCHC: 33.6 g/dL (ref 30.0–36.0)
MCHC: 31.8 g/dL (ref 30.0–36.0)
MCV: 98.7 fL (ref 80.0–100.0)
MCV: 97.6 fL (ref 80.0–100.0)
MCV: 100.7 fL — ABNORMAL HIGH (ref 80.0–100.0)
Platelets: 218 10*3/uL (ref 150–400)
Platelets: 167 10*3/uL (ref 150–400)
Platelets: 158 10*3/uL (ref 150–400)
RBC: 3.11 MIL/uL — ABNORMAL LOW (ref 3.87–5.11)
RBC: 2.9 MIL/uL — ABNORMAL LOW (ref 3.87–5.11)
RBC: 2.84 MIL/uL — ABNORMAL LOW (ref 3.87–5.11)
RDW: 12.3 % (ref 11.5–15.5)
RDW: 12.5 % (ref 11.5–15.5)
RDW: 12.6 % (ref 11.5–15.5)
WBC: 12.2 10*3/uL — ABNORMAL HIGH (ref 4.0–10.5)
WBC: 9 10*3/uL (ref 4.0–10.5)
WBC: 9.8 10*3/uL (ref 4.0–10.5)
nRBC: 0 % (ref 0.0–0.2)
nRBC: 0 % (ref 0.0–0.2)
nRBC: 0 % (ref 0.0–0.2)

## 2020-10-27 LAB — GLUCOSE, CAPILLARY
Glucose-Capillary: 101 mg/dL — ABNORMAL HIGH (ref 70–99)
Glucose-Capillary: 104 mg/dL — ABNORMAL HIGH (ref 70–99)
Glucose-Capillary: 111 mg/dL — ABNORMAL HIGH (ref 70–99)
Glucose-Capillary: 121 mg/dL — ABNORMAL HIGH (ref 70–99)
Glucose-Capillary: 124 mg/dL — ABNORMAL HIGH (ref 70–99)
Glucose-Capillary: 127 mg/dL — ABNORMAL HIGH (ref 70–99)
Glucose-Capillary: 128 mg/dL — ABNORMAL HIGH (ref 70–99)
Glucose-Capillary: 129 mg/dL — ABNORMAL HIGH (ref 70–99)
Glucose-Capillary: 132 mg/dL — ABNORMAL HIGH (ref 70–99)
Glucose-Capillary: 133 mg/dL — ABNORMAL HIGH (ref 70–99)
Glucose-Capillary: 135 mg/dL — ABNORMAL HIGH (ref 70–99)
Glucose-Capillary: 137 mg/dL — ABNORMAL HIGH (ref 70–99)
Glucose-Capillary: 99 mg/dL (ref 70–99)

## 2020-10-27 LAB — MAGNESIUM
Magnesium: 1.9 mg/dL (ref 1.7–2.4)
Magnesium: 1.9 mg/dL (ref 1.7–2.4)

## 2020-10-27 LAB — CREATININE, SERUM
Creatinine, Ser: 0.68 mg/dL (ref 0.44–1.00)
GFR, Estimated: >60 mL/min (ref >60)

## 2020-10-27 MED ORDER — ENOXAPARIN SODIUM 40 MG/0.4ML ~~LOC~~ SOLN
40.0000 mg | Freq: Every day | SUBCUTANEOUS | Status: DC
  Administered 2020-10-27 – 2020-10-30 (×4): 40 mg via SUBCUTANEOUS
  Filled 2020-10-27 (×4): qty 0.4

## 2020-10-27 MED ORDER — INSULIN ASPART 100 UNIT/ML ~~LOC~~ SOLN
0.0000 [IU] | SUBCUTANEOUS | Status: DC
  Administered 2020-10-27 – 2020-10-29 (×7): 2 [IU] via SUBCUTANEOUS

## 2020-10-27 MED ORDER — INSULIN DETEMIR 100 UNIT/ML ~~LOC~~ SOLN
10.0000 [IU] | Freq: Every day | SUBCUTANEOUS | Status: DC
  Administered 2020-10-28 – 2020-10-31 (×4): 10 [IU] via SUBCUTANEOUS
  Filled 2020-10-27 (×4): qty 0.1

## 2020-10-27 MED ORDER — INSULIN DETEMIR 100 UNIT/ML ~~LOC~~ SOLN
10.0000 [IU] | Freq: Once | SUBCUTANEOUS | Status: AC
  Administered 2020-10-27: 10 [IU] via SUBCUTANEOUS
  Filled 2020-10-27: qty 0.1

## 2020-10-27 NOTE — Discharge Summary (Signed)
Physician Discharge Summary  Patient ID: Sue Baker MRN: [id number] DOB/AGE: [birth year] 63 y.o.  Admit date: 10/25/2020 Discharge date: 10/31/2020  Admission Diagnoses:  Patient Active Problem List   Diagnosis Date Noted  . CAD (coronary artery disease), native coronary artery 10/25/2020  . Coronary artery disease   . Abnormal cardiac CT angiography   . Noncompliance w/medication treatment due to intermit use of medication 11/18/2017  . Arthritis 01/06/2015  . Hyperlipidemia LDL goal <70 02/08/2014  . OSA on CPAP 02/08/2014  . Morbid obesity ([place]) 02/08/2014  . Depression 02/08/2014  . Osteoarthritis of hand 05/21/2013  . Diabetes mellitus ([place]) 09/29/2012  . Polypharmacy 09/29/2012  . Hypertension 09/29/2012  . Arthritis of both knees 09/29/2012  . Irregular menses 09/29/2012  . Hypothyroid 09/29/2012  . GERD (gastroesophageal reflux disease) 09/29/2012  . Mood disorder ([place]) 09/29/2012    Discharge Diagnoses:  Active Problems:   Coronary artery disease   Abnormal cardiac CT angiography   CAD (coronary artery disease), native coronary artery   S/P CABG x 4  Patient Active Problem List   Diagnosis Date Noted  . S/P CABG x 4 10/26/2020  . CAD (coronary artery disease), native coronary artery 10/25/2020  . Coronary artery disease   . Abnormal cardiac CT angiography   . Noncompliance w/medication treatment due to intermit use of medication 11/18/2017  . Arthritis 01/06/2015  . Hyperlipidemia LDL goal <70 02/08/2014  . OSA on CPAP 02/08/2014  . Morbid obesity ([place]) 02/08/2014  . Depression 02/08/2014  . Osteoarthritis of hand 05/21/2013  . Diabetes mellitus ([place]) 09/29/2012  . Polypharmacy 09/29/2012  . Hypertension 09/29/2012  . Arthritis of both knees 09/29/2012  . Irregular menses 09/29/2012  . Hypothyroid 09/29/2012  . GERD (gastroesophageal reflux disease) 09/29/2012  . Mood disorder ([place]) 09/29/2012   Discharged Condition: good  History of Present  Illness:     65 year old female presents for an elective left heart cath and was found to have two-vessel coronary artery disease.  She has a long history of diabetes and strong family history of coronary artery disease, and has been proactive in regards to regular screenings.  Over the last several months she has noticed some early fatigue and exertional dyspnea.  She has had some occasional chest pressure.  After speaking with her cardiologist she underwent further evaluation, and had her left heart cath today.  She is currently asymptomatic.   Hospital course: The patient was seen in cardiothoracic surgical consultation by Dr. Kipp Brood who evaluated the patient and all relevant studies and recommended proceeding with CABG.  On 10/26/2020 she was taken the operating room where she underwent the below described procedure.  She tolerated it well and was taken to the surgical ICU in stable condition.   Postoperative hospital course:  The patient has remained neurologically stable with good pain control.  Initially she did require some inotropic support and has also been started on midodrine to assist with blood pressure management.  Early in the hospital course the beta-blocker is placed on hold.  It has subsequently been restarted.  She has not started on an ACE inhibitor or ARB with low relative blood pressure.  The patient has been started on statin as well as aspirin.  Chest tubes were kept in place on postoperative day #1.  They have subsequently been removed as drainage decreased.  She does have an expected acute blood loss anemia which is stabilized.  Most recent hemoglobin hematocrit dated 10/29/2020 are 9.1/27.8 respectively.  She has had some  usual postoperative volume overload but responded well to diuretics and is now currently below preoperative weight with resolution of edema.  Renal function has remained stable with good urine output.  Incisions are noted to be healing well without evidence of  infection.  Oxygen has been weaned and she maintains good saturations on room air.  She is tolerating gradually increasing activities using standard protocols.  At the time of discharge the patient is felt to be quite stable  Consults: None  Significant Diagnostic Studies:   Mid RCA lesion is 20% stenosed.  Dist RCA lesion is 30% stenosed.  Prox RCA-1 lesion is 50% stenosed.  Prox RCA-2 lesion is 30% stenosed.  Ost LAD to Prox LAD lesion is 60% stenosed.  Ramus lesion is 70% stenosed.  Ost Cx to Prox Cx lesion is 85% stenosed.  Prox LAD to Mid LAD lesion is 30% stenosed.  Mid LAD lesion is 40% stenosed.  1st Diag lesion is 80% stenosed.  Mid LM to Dist LM lesion is 50% stenosed.   Significant multivessel coronary calcification with 50% distal left main stenosis prior to trifurcating into the LAD, ramus intermediate and left circumflex vessel. The LAD has 60% focal ostial stenosis and then had diffuse calcification in the proximal to mid segment. The first diagonal vessel 80% stenoses with 30 to 40% mid LAD stenoses; 70% ostial ramus intermediate stenosis; 85% ostial left circumflex stenosis the RCA has 50% eccentric proximal stenosis and mild irregularities of 30%, 20% and 30% in the proximal to mid and mid distal vessel.  Normal LV function with EF 55 to 60%. LVEDP 10 mmHg.   RECOMMENDATION:  Patient with longstanding history of diabetes mellitus for over 20 years, recommend surgical consultation for CABG revascularization surgery. We will initially plan to keep the patient in the hospital so that surgery can see hopefully today. Timing of surgery will be dependent upon surgical schedule.    Treatments:    10/26/2020 Patient:  Sue Baker Pre-Op Dx:     Left main coronary artery disease                         Diabetes mellitus                         Morbid obesity Post-op Dx: Same Procedure: CABG X 4.  LIMA to LAD, reverse saphenous vein graft to OM 3, ramus  intermedius, and first diagonal Endoscopic greater saphenous vein harvest on the right   Surgeon and Role:      * Lightfoot, Lucile Crater, MD - Primary    Evonnie Pat, PA-C- assisting Anesthesia  general EBL: 500 ml Blood Administration: None Xclamp Time:  68 min Pump Time:  174min  Drains: 19 F blake drain: L, mediastinal  Wires: None Counts: correct   Discharge Exam: Blood pressure (!) 110/50, pulse 79, temperature 98.9 F (37.2 C), temperature source Oral, resp. rate 20, height $RemoveBe'5\' 4"'XLflPgUts$  (1.626 m), weight 87.6 kg, SpO2 98 %.  General appearance: alert, cooperative and no distress Heart: regular rate and rhythm Lungs: clear to auscultation bilaterally Abdomen: benign Extremities: no edema Wound: incis healing well   Disposition: Discharge disposition: 01-Home or Self Care       Discharge Instructions    Amb Referral to Cardiac Rehabilitation   Complete by: As directed    Diagnosis: CABG   CABG X ___: 4   After initial evaluation and assessments completed: Virtual Based Care  may be provided alone or in conjunction with Phase 2 Cardiac Rehab based on patient barriers.: Yes     Allergies as of 10/31/2020      Reactions   Trulicity [dulaglutide] Other (See Comments)   Changed vision, couldn't read      Medication List    STOP taking these medications   aspirin 81 MG tablet Replaced by: aspirin 325 MG EC tablet   cyclobenzaprine 10 MG tablet Commonly known as: FLEXERIL   isosorbide mononitrate 30 MG 24 hr tablet Commonly known as: IMDUR   meloxicam 15 MG tablet Commonly known as: MOBIC   olmesartan 20 MG tablet Commonly known as: BENICAR     TAKE these medications   aspirin 325 MG EC tablet Take 1 tablet (325 mg total) by mouth daily. Replaces: aspirin 81 MG tablet   blood glucose meter kit and supplies Kit Dispense based on patient and insurance preference. Use up to four times daily as directed. (FOR ICD-10 E11.9)   Bydureon BCise 2 MG/0.85ML  Auij Generic drug: Exenatide ER Inject 2 mg into the skin every Friday.   escitalopram 10 MG tablet Commonly known as: LEXAPRO TAKE 1 TABLET BY MOUTH DAILY   esomeprazole 20 MG capsule Commonly known as: NexIUM 24HR Take 1 capsule (20 mg total) by mouth daily at 12 noon.   fenofibrate 145 MG tablet Commonly known as: TRICOR Take 1 tablet (145 mg total) by mouth daily.   FreeStyle Libre 2 Sensor Misc   Jardiance 25 MG Tabs tablet Generic drug: empagliflozin Take 25 mg by mouth daily.   Lantus SoloStar 100 UNIT/ML Solostar Pen Generic drug: insulin glargine Inject 15 Units into the skin at bedtime.   levothyroxine 150 MCG tablet Commonly known as: SYNTHROID Take 1 tablet (150 mcg total) by mouth daily before breakfast. TAKE 1 TABLET BY MOUTH DAILY BEFORE BREAKFAST   metFORMIN 500 MG 24 hr tablet Commonly known as: GLUCOPHAGE-XR Take 1,000 mg by mouth 2 (two) times daily.   metoprolol succinate 25 MG 24 hr tablet Commonly known as: Toprol XL Take 0.5 tablets (12.5 mg total) by mouth 2 (two) times daily. What changed: when to take this   OneTouch Verio test strip Generic drug: glucose blood USE AS DIRECTED   rosuvastatin 40 MG tablet Commonly known as: Crestor Take 1 tablet (40 mg total) by mouth daily.   Systane Complete 0.6 % Soln Generic drug: Propylene Glycol Place 1 drop into both eyes 4 (four) times daily as needed (dry eyes).   traMADol 50 MG tablet Commonly known as: ULTRAM Take 1 tablet (50 mg total) by mouth every 6 (six) hours as needed for moderate pain. What changed:   how much to take  when to take this  reasons to take this   Womens Multi Vitamin & Mineral Tabs Take 1 tablet by mouth daily.            Durable Medical Equipment  (From admission, onward)         Start     Ordered   10/31/20 0733  For home use only DME Walker rolling  Once       Question Answer Comment  Walker: With 5 Inch Wheels   Patient needs a walker to treat  with the following condition Physical deconditioning      10/31/20 0733          Follow-up Information    [name], MD Follow up.   Specialty: Cardiothoracic Surgery Why: Please see discharge  paperwork for follow-up appointment with surgeon. Contact information: [address] [place] [address] [phone]        Troy Sine, MD Follow up.   Specialty: Cardiology Why: Please see discharge paperwork for follow-up appointment with cardiologist. Contact information: [address] [place] [address] [phone]             The patient has been discharged on:   1.Beta Blocker:  Yes [ y  ]                              No   [   ]                              If No, reason:  2.Ace Inhibitor/ARB: Yes [   ]                                     No  [  n  ]                                     If No, reason:labile BP  3.Statin:   Yes Blue.[place]ese   ]                  No  [   ]                  If No, reason:  4.Ecasa:  Yes  [ y  ]                  No   [   ]                  If No, reason:  Signed: Wilder Glade Bayler Nehring PA-C 10/31/2020, 10:12 AM

## 2020-10-27 NOTE — Discharge Instructions (Signed)
[facility] office number [phone]    Endoscopic Saphenous Vein Harvesting, Care After This sheet gives you information about how to care for yourself after your procedure. Your health care provider may also give you more specific instructions. If you have problems or questions, contact your health care provider. What can I expect after the procedure? After the procedure, it is common to have:  Pain.  Bruising.  Swelling.  Numbness. Follow these instructions at home: Incision care  Follow instructions from your health care provider about how to take care of your incisions. Make sure you: ? Wash your hands with soap and water before and after you change your bandages (dressings). If soap and water are not available, use hand sanitizer. ? Change your dressings as told by your health care provider. ? Leave stitches (sutures), skin glue, or adhesive strips in place. These skin closures may need to stay in place for 2 weeks or longer. If adhesive strip edges start to loosen and curl up, you may trim the loose edges. Do not remove adhesive strips completely unless your health care provider tells you to do that.  Check your incision areas every day for signs of infection. Check for: ? More redness, swelling, or pain. ? Fluid or blood. ? Warmth. ? Pus or a bad smell.   Medicines  Take over-the-counter and prescription medicines only as told by your health care provider.  Ask your health care provider if the medicine prescribed to you requires you to avoid driving or using heavy machinery. General instructions  Raise (elevate) your legs above the level of your heart while you are sitting or lying down.  Avoid crossing your legs.  Avoid sitting for long periods of time. Change positions every 30 minutes.  Do any exercises your health care providers have given you. These may include deep breathing, coughing, and walking exercises.  Do not take baths, swim, or use a hot tub until  your health care provider approves. Ask your health care provider if you may take showers. You may only be allowed to take sponge baths.  Wear compression stockings as told by your health care provider. These stockings help to prevent blood clots and reduce swelling in your legs.  Keep all follow-up visits as told by your health care provider. This is important. Contact a health care provider if:  Medicine does not help your pain.  Your pain gets worse.  You have new leg bruises or your leg bruises get bigger.  Your leg feels numb.  You have more redness, swelling, or pain around your incision.  You have fluid or blood coming from your incision.  Your incision feels warm to the touch.  You have pus or a bad smell coming from your incision.  You have a fever. Get help right away if:  Your pain is severe.  You develop pain, tenderness, warmth, redness, or swelling in any part of your leg.  You have chest pain.  You have trouble breathing. Summary  Raise (elevate) your legs above the level of your heart while you are sitting or lying down.  Wear compression stockings as told by your health care provider.  Make sure you know which symptoms should prompt you to contact your health care provider.  Keep all follow-up visits as told by your health care provider. This information is not intended to replace advice given to you by your health care provider. Make sure you discuss any questions you have with your health care  provider. Document Revised: 08/17/2018 Document Reviewed: 08/17/2018 Elsevier Patient Education  2021 [place]. Coronary Artery Bypass Grafting, Care After This sheet gives you information about how to care for yourself after your procedure. Your doctor may also give you more specific instructions. If you have problems or questions, call your doctor. What can I expect after the procedure? After the procedure, it is common to:  Feel sick to your stomach  (nauseous).  Not want to eat as much as normal (lack of appetite).  Have trouble pooping (constipation).  Have weakness and tiredness (fatigue).  Feel sad (depressed) or grouchy (irritable).  Have pain or discomfort around the cuts from surgery (incisions). Follow these instructions at home: Medicines  Take over-the-counter and prescription medicines only as told by your doctor. Do not stop taking medicines or start any new medicines unless your doctor says it is okay.  If you were prescribed an antibiotic medicine, take it as told by your doctor. Do not stop taking the antibiotic even if you start to feel better. Incision care  Follow instructions from your doctor about how to take care of your cuts from surgery. Make sure you: ? Wash your hands with soap and water before and after you change your bandage (dressing). If you cannot use soap and water, use hand sanitizer. ? Change your bandage as told by your doctor. ? Leave stitches (sutures), skin glue, or skin tape (adhesive) strips in place. They may need to stay in place for 2 weeks or longer. If tape strips get loose and curl up, you may trim the loose edges. Do not remove tape strips completely unless your doctor says it is okay.  Make sure the surgery cuts are clean, dry, and protected.  Check your cut areas every day for signs of infection. Check for: ? More redness, swelling, or pain. ? More fluid or blood. ? Warmth. ? Pus or a bad smell.  If cuts were made in your legs: ? Avoid crossing your legs. ? Avoid sitting for long periods of time. Change positions every 30 minutes. ? Raise (elevate) your legs when you are sitting.   Bathing  You may shower   Pat the surgery cuts dry. Do not rub the cuts to dry.  Ask your doctor when you can shower. Eating and drinking  Eat foods that are high in fiber, such as beans, nuts, whole grains, and raw fruits and vegetables. Any meats you eat should be lean cut. Avoid canned,  processed, and fried foods. This can help prevent trouble pooping. This is also a part of a heart-healthy diet.  Drink enough fluid to keep your pee (urine) pale yellow.  Do not drink alcohol until you are fully recovered. Ask your doctor when it is safe to drink alcohol.   Activity  Rest and limit your activity as told by your doctor. You may be told to: ? Stop any activity right away if you have chest pain, shortness of breath, irregular heartbeats, or dizziness. Get help right away if you have any of these symptoms. ? Move around often for short periods or take short walks as told by your doctor. Slowly increase your activities. ? Avoid lifting, pushing, or pulling anything that is heavier than 10 lb (4.5 kg) for at least 6 weeks or as told by your doctor.  Do physical therapy or a cardiac rehab (cardiac rehabilitation) program as told by your doctor. ? Physical therapy involves doing exercises to maintain movement and build strength and endurance. ?  A cardiac rehab program includes:  Exercise training.  Education.  Counseling.  Do not drive until your doctor says it is okay.  Ask your doctor when you can go back to work.  Ask your doctor when you can be sexually active. General instructions  Do not drive or use heavy machinery while taking prescription pain medicine.  Do not use any products that contain nicotine or tobacco. These include cigarettes, e-cigarettes, and chewing tobacco. If you need help quitting, ask your doctor.  Take 2-3 deep breaths every few hours during the day while you get better. This helps expand your lungs and prevent problems.  If you were given a device called an incentive spirometer, use it several times a day to practice deep breathing. Support your chest with a pillow or your arms when you take deep breaths or cough.  Wear compression stockings as told by your doctor.  Weigh yourself every day. This helps to see if your body is holding  (retaining) fluid that may make your heart and lungs work harder.  Keep all follow-up visits as told by your doctor. This is important. Contact a doctor if:  You have more redness, swelling, or pain around any cut.  You have more fluid or blood coming from any cut.  Any cut feels warm to the touch.  You have pus or a bad smell coming from any cut.  You have a fever.  You have swelling in your ankles or legs.  You have pain in your legs.  You gain 2 lb (0.9 kg) or more a day.  You feel sick to your stomach or you throw up (vomit).  You have watery poop (diarrhea). Get help right away if:  You have chest pain that goes to your jaw or arms.  You are short of breath.  You have a fast or irregular heartbeat.  You notice a "clicking" in your breastbone (sternum) when you move.  You have any signs of a stroke. "BE FAST" is an easy way to remember the main warning signs: ? B - Balance. Signs are dizziness, sudden trouble walking, or loss of balance. ? E - Eyes. Signs are trouble seeing or a change in how you see. ? F - Face. Signs are sudden weakness or loss of feeling of the face, or the face or eyelid drooping on one side. ? A - Arms. Signs are weakness or loss of feeling in an arm. This happens suddenly and usually on one side of the body. ? S - Speech. Signs are sudden trouble speaking, slurred speech, or trouble understanding what people say. ? T - Time. Time to call emergency services. Write down what time symptoms started.  You have other signs of a stroke, such as: ? A sudden, very bad headache with no known cause. ? Feeling sick to your stomach. ? Throwing up. ? Jerky movements you cannot control (seizure). These symptoms may be an emergency. Do not wait to see if the symptoms will go away. Get medical help right away. Call your local emergency services (911 in the [place] Do not drive yourself to the hospital. Summary  After the procedure, it is common to have pain  or discomfort in the cuts from surgery (incisions).  Do not take baths, swim, or use a hot tub until your doctor says it is okay.  Slowly increase your activities. You may need physical therapy or cardiac rehab.  Weigh yourself every day. This helps to see if your body is  holding fluid. This information is not intended to replace advice given to you by your health care provider. Make sure you discuss any questions you have with your health care provider. Document Revised: 05/19/2018 Document Reviewed: 05/19/2018 Elsevier Patient Education  2021 [employer].

## 2020-10-27 NOTE — Progress Notes (Signed)
      [address]       [place] [address]             [phone]                 1 Day Post-Op Procedure(s) (LRB): CORONARY ARTERY BYPASS GRAFTING (CABG), ON PUMP, TIMES FOUR, USING LEFT INTERNAL MAMMARY ARTERY AND ENDOSCOPICALLY HARVESTED RIGHT GREATER SAPHENOUS VEIN (N/A) TRANSESOPHAGEAL ECHOCARDIOGRAM (TEE) (N/A)   Events: No events _______________________________________________________________ Vitals: BP (!) 109/55   Pulse 77   Temp 100.22 F (37.9 C)   Resp 13   Ht 5\' 4"  (1.626 m)   Wt 92.9 kg   SpO2 96%   BMI 35.16 kg/m   - Neuro: alert NAD  - Cardiovascular: sinus  Drips: levo 1.   CVP:  [2 mmHg-21 mmHg] 4 mmHg  - Pulm: EWOB.  SS CT output  ABG    Component Value Date/Time   PHART 7.302 (L) 10/26/2020 1807   PCO2ART 42.8 10/26/2020 1807   PO2ART 74 (L) 10/26/2020 1807   HCO3 21.0 10/26/2020 1807   TCO2 22 10/26/2020 1807   ACIDBASEDEF 5.0 (H) 10/26/2020 1807   O2SAT 93.0 10/26/2020 1807    - Abd: ND - Extremity: warm  .Intake/Output      02/03 0701 02/04 0700 02/04 0701 02/05 0700   P.O.     I.V. (mL/kg) 3182.6 (34.3)    Blood 306    IV Piggyback 1397.4    Total Intake(mL/kg) 4886 (52.6)    Urine (mL/kg/hr) 5695 (2.6)    Blood 540    Chest Tube 390    Total Output 6625    Net -1739.1            _______________________________________________________________ Labs: CBC Latest Ref Rng & Units 10/27/2020 10/26/2020 10/26/2020  WBC 4.0 - 10.5 K/uL 12.2(H) 11.0(H) -  Hemoglobin 12.0 - 15.0 g/dL 9.9(L) 9.6(L) 8.8(L)  Hematocrit 36.0 - 46.0 % 30.7(L) 28.7(L) 26.0(L)  Platelets 150 - 400 K/uL 218 154 -   CMP Latest Ref Rng & Units 10/27/2020 10/26/2020 10/26/2020  Glucose 70 - 99 mg/dL 135(H) 138(H) -  BUN 8 - 23 mg/dL 12 13 -  Creatinine 0.44 - 1.00 mg/dL 0.76 0.73 -  Sodium 135 - 145 mmol/L 139 140 143  Potassium 3.5 - 5.1 mmol/L 3.9 4.2 4.1  Chloride 98 - 111 mmol/L 106 110 -  CO2 22 - 32 mmol/L 20(L) 21(L) -  Calcium 8.9  - 10.3 mg/dL 8.9 8.9 -  Total Protein 6.5 - 8.1 g/dL - - -  Total Bilirubin 0.3 - 1.2 mg/dL - - -  Alkaline Phos 38 - 126 U/L - - -  AST 15 - 41 U/L - - -  ALT 0 - 44 U/L - - -    CXR: L Effusion  _______________________________________________________________  Assessment and Plan: POD 1 s/p CABG 4  Neuro: pain controlled  CV: will wean levo.  Will have midodrine in case.  Appears well resuscitated with SV of 100.  Will hold BB for now.  Will remove A line.  on Statin, and asp  Pulm: continue pulm toilet.  Will keep CT for now Renal: creat stable.  Good uop GI: advancing diet Heme: stable H/H ID: afebrile Endo: SSI Dispo: continue ICU care.   Sue Baker 10/27/2020 9:28 AM

## 2020-10-27 NOTE — Anesthesia Postprocedure Evaluation (Signed)
Anesthesia Post Note  Patient: Sue Baker  Procedure(s) Performed: CORONARY ARTERY BYPASS GRAFTING (CABG), ON PUMP, TIMES FOUR, USING LEFT INTERNAL MAMMARY ARTERY AND ENDOSCOPICALLY HARVESTED RIGHT GREATER SAPHENOUS VEIN (N/A Chest) TRANSESOPHAGEAL ECHOCARDIOGRAM (TEE) (N/A )     Patient location during evaluation: SICU Anesthesia Type: General Level of consciousness: sedated and patient remains intubated per anesthesia plan Pain management: pain level controlled Vital Signs Assessment: post-procedure vital signs reviewed and stable Respiratory status: patient on ventilator - see flowsheet for VS Cardiovascular status: stable Anesthetic complications: no   No complications documented.  Last Vitals:  Vitals:   10/27/20 1345 10/27/20 1400  BP:  (!) 77/48  Pulse: 82 80  Resp: (!) 26 (!) 26  Temp: 37.7 C 37.6 C  SpO2: 94% 95%    Last Pain:  Vitals:   10/27/20 1412  TempSrc:   PainSc: [place]

## 2020-10-27 NOTE — Progress Notes (Signed)
TCTS Evening Rounds  POD #1 s/p CABG No complaints; just finished walk  BP (!) 106/51   Pulse 76   Temp 99.5 F (37.5 C)   Resp (!) 8   Ht 5\' 4"  (1.626 m)   Wt 92.9 kg   SpO2 96%   BMI 35.16 kg/m   A/O CTA RRR   Intake/Output Summary (Last 24 hours) at 10/27/2020 1755 Last data filed at 10/27/2020 1400 Gross per 24 hour  Intake 1543.7 ml  Output 2935 ml  Net -1391.3 ml    A/p: remove chest tubes tonight Step down tomorrow. Jamaiyah Pyle Z. Orvan Seen, [place]

## 2020-10-28 ENCOUNTER — Inpatient Hospital Stay (HOSPITAL_COMMUNITY): Payer: 59

## 2020-10-28 LAB — CBC
HCT: 27.4 % — ABNORMAL LOW (ref 36.0–46.0)
Hemoglobin: 9 g/dL — ABNORMAL LOW (ref 12.0–15.0)
MCH: 33 pg (ref 26.0–34.0)
MCHC: 32.8 g/dL (ref 30.0–36.0)
MCV: 100.4 fL — ABNORMAL HIGH (ref 80.0–100.0)
Platelets: 158 10*3/uL (ref 150–400)
RBC: 2.73 MIL/uL — ABNORMAL LOW (ref 3.87–5.11)
RDW: 12.8 % (ref 11.5–15.5)
WBC: 10.4 10*3/uL (ref 4.0–10.5)
nRBC: 0 % (ref 0.0–0.2)

## 2020-10-28 LAB — BASIC METABOLIC PANEL
Anion gap: 8 (ref 5–15)
BUN: 13 mg/dL (ref 8–23)
CO2: 25 mmol/L (ref 22–32)
Calcium: 8.9 mg/dL (ref 8.9–10.3)
Chloride: 101 mmol/L (ref 98–111)
Creatinine, Ser: 0.55 mg/dL (ref 0.44–1.00)
GFR, Estimated: >60 mL/min (ref >60)
Glucose, Bld: 103 mg/dL — ABNORMAL HIGH (ref 70–99)
Potassium: 4.2 mmol/L (ref 3.5–5.1)
Sodium: 134 mmol/L — ABNORMAL LOW (ref 135–145)

## 2020-10-28 LAB — GLUCOSE, CAPILLARY
Glucose-Capillary: 102 mg/dL — ABNORMAL HIGH (ref 70–99)
Glucose-Capillary: 120 mg/dL — ABNORMAL HIGH (ref 70–99)
Glucose-Capillary: 114 mg/dL — ABNORMAL HIGH (ref 70–99)
Glucose-Capillary: 142 mg/dL — ABNORMAL HIGH (ref 70–99)
Glucose-Capillary: 160 mg/dL — ABNORMAL HIGH (ref 70–99)

## 2020-10-28 MED ORDER — FUROSEMIDE 10 MG/ML IJ SOLN
40.0000 mg | Freq: Two times a day (BID) | INTRAMUSCULAR | Status: DC
  Administered 2020-10-28 – 2020-10-29 (×4): 40 mg via INTRAVENOUS
  Filled 2020-10-28 (×4): qty 4

## 2020-10-28 NOTE — Plan of Care (Signed)
  Problem: Education: Goal: Ability to verbalize understanding of medication therapies will improve Outcome: Progressing   Problem: Activity: Goal: Capacity to carry out activities will improve Outcome: Progressing   Problem: Education: Goal: Knowledge of General Education information will improve Description: Including pain rating scale, medication(s)/side effects and non-pharmacologic comfort measures Outcome: Progressing   Problem: Health Behavior/Discharge Planning: Goal: Ability to manage health-related needs will improve Outcome: Progressing   Problem: Activity: Goal: Risk for activity intolerance will decrease Outcome: Progressing   Problem: Nutrition: Goal: Adequate nutrition will be maintained Outcome: Progressing   Problem: Elimination: Goal: Will not experience complications related to bowel motility Outcome: Progressing   Problem: Pain Managment: Goal: General experience of comfort will improve Outcome: Progressing   Problem: Skin Integrity: Goal: Risk for impaired skin integrity will decrease Outcome: Progressing

## 2020-10-28 NOTE — Plan of Care (Signed)

## 2020-10-28 NOTE — Progress Notes (Signed)
2 Days Post-Op Procedure(s) (LRB): CORONARY ARTERY BYPASS GRAFTING (CABG), ON PUMP, TIMES FOUR, USING LEFT INTERNAL MAMMARY ARTERY AND ENDOSCOPICALLY HARVESTED RIGHT GREATER SAPHENOUS VEIN (N/A) TRANSESOPHAGEAL ECHOCARDIOGRAM (TEE) (N/A) Subjective: Feeling better  Objective: Vital signs in last 24 hours: Temp:  [98.2 F (36.8 C)-100.22 F (37.9 C)] 98.2 F (36.8 C) (02/05 0355) Pulse Rate:  [65-95] 95 (02/05 0600) Cardiac Rhythm: Normal sinus rhythm (02/05 0400) Resp:  [0-37] 27 (02/05 0600) BP: (77-122)/(45-92) 101/51 (02/05 0500) SpO2:  [85 %-98 %] 98 % (02/05 0600) Arterial Line BP: (119-150)/(40-58) 150/58 (02/04 1300) Weight:  [93 kg] 93 kg (02/05 0500)  Hemodynamic parameters for last 24 hours: CVP:  [1 mmHg-13 mmHg] 13 mmHg  Intake/Output from previous day: 02/04 0701 - 02/05 0700 In: 738.6 [P.O.:720; I.V.:18.6] Out: 1435 [Urine:1365; Chest Tube:70] Intake/Output this shift: No intake/output data recorded.  General appearance: alert and cooperative Neurologic: intact Heart: regular rate and rhythm, S1, S2 normal, no murmur, click, rub or gallop Lungs: clear to auscultation bilaterally Abdomen: soft, non-tender; bowel sounds normal; no masses,  no organomegaly Extremities: extremities normal, atraumatic, no cyanosis or edema Wound: dressed wtih Prevena  Lab Results: Recent Labs    10/27/20 1632 10/28/20 0434  WBC 9.8 10.4  HGB 9.1* 9.0*  HCT 28.6* 27.4*  PLT 158 158   BMET:  Recent Labs    10/27/20 1632 10/28/20 0434  NA 137 134*  K 4.1 4.2  CL 104 101  CO2 24 25  GLUCOSE 96 103*  BUN 13 13  CREATININE 0.63 0.55  CALCIUM 8.9 8.9    PT/INR:  Recent Labs    10/26/20 1330  LABPROT 16.9*  INR 1.4*   ABG    Component Value Date/Time   PHART 7.302 (L) 10/26/2020 1807   HCO3 21.0 10/26/2020 1807   TCO2 22 10/26/2020 1807   ACIDBASEDEF 5.0 (H) 10/26/2020 1807   O2SAT 93.0 10/26/2020 1807   CBG (last 3)  Recent Labs    10/27/20 1932  10/27/20 2326 10/28/20 0349  GLUCAP 99 104* 102*    Assessment/Plan: S/P Procedure(s) (LRB): CORONARY ARTERY BYPASS GRAFTING (CABG), ON PUMP, TIMES FOUR, USING LEFT INTERNAL MAMMARY ARTERY AND ENDOSCOPICALLY HARVESTED RIGHT GREATER SAPHENOUS VEIN (N/A) TRANSESOPHAGEAL ECHOCARDIOGRAM (TEE) (N/A) Mobilize Diuresis See progression orders   LOS: 3 days    Sue Baker 10/28/2020

## 2020-10-29 ENCOUNTER — Inpatient Hospital Stay (HOSPITAL_COMMUNITY): Payer: 59

## 2020-10-29 LAB — TYPE AND SCREEN
ABO/RH(D): O NEG
Antibody Screen: NEGATIVE
Unit division: 0
Unit division: 0

## 2020-10-29 LAB — BPAM RBC
Blood Product Expiration Date: 202203082359
Blood Product Expiration Date: 202203082359
Unit Type and Rh: 5100
Unit Type and Rh: 5100

## 2020-10-29 LAB — GLUCOSE, CAPILLARY
Glucose-Capillary: 103 mg/dL — ABNORMAL HIGH (ref 70–99)
Glucose-Capillary: 112 mg/dL — ABNORMAL HIGH (ref 70–99)
Glucose-Capillary: 117 mg/dL — ABNORMAL HIGH (ref 70–99)
Glucose-Capillary: 133 mg/dL — ABNORMAL HIGH (ref 70–99)
Glucose-Capillary: 134 mg/dL — ABNORMAL HIGH (ref 70–99)
Glucose-Capillary: 143 mg/dL — ABNORMAL HIGH (ref 70–99)

## 2020-10-29 LAB — CBC
HCT: 27.8 % — ABNORMAL LOW (ref 36.0–46.0)
Hemoglobin: 9.1 g/dL — ABNORMAL LOW (ref 12.0–15.0)
MCH: 32.3 pg (ref 26.0–34.0)
MCHC: 32.7 g/dL (ref 30.0–36.0)
MCV: 98.6 fL (ref 80.0–100.0)
Platelets: 155 10*3/uL (ref 150–400)
RBC: 2.82 MIL/uL — ABNORMAL LOW (ref 3.87–5.11)
RDW: 12.2 % (ref 11.5–15.5)
WBC: 9.1 10*3/uL (ref 4.0–10.5)
nRBC: 0 % (ref 0.0–0.2)

## 2020-10-29 LAB — BASIC METABOLIC PANEL
Anion gap: 9 (ref 5–15)
BUN: 13 mg/dL (ref 8–23)
CO2: 30 mmol/L (ref 22–32)
Calcium: 8.8 mg/dL — ABNORMAL LOW (ref 8.9–10.3)
Chloride: 98 mmol/L (ref 98–111)
Creatinine, Ser: 0.74 mg/dL (ref 0.44–1.00)
GFR, Estimated: >60 mL/min (ref >60)
Glucose, Bld: 113 mg/dL — ABNORMAL HIGH (ref 70–99)
Potassium: 3.5 mmol/L (ref 3.5–5.1)
Sodium: 137 mmol/L (ref 135–145)

## 2020-10-29 MED ORDER — POTASSIUM CHLORIDE CRYS ER 20 MEQ PO TBCR
20.0000 meq | EXTENDED_RELEASE_TABLET | Freq: Two times a day (BID) | ORAL | Status: AC
  Administered 2020-10-29 (×2): 20 meq via ORAL
  Filled 2020-10-29 (×2): qty 1

## 2020-10-29 MED ORDER — INSULIN ASPART 100 UNIT/ML ~~LOC~~ SOLN
0.0000 [IU] | Freq: Three times a day (TID) | SUBCUTANEOUS | Status: DC
  Administered 2020-10-30: 4 [IU] via SUBCUTANEOUS

## 2020-10-29 NOTE — Progress Notes (Addendum)
      [address]       [place] [address]             [phone]      3 Days Post-Op Procedure(s) (LRB): CORONARY ARTERY BYPASS GRAFTING (CABG), ON PUMP, TIMES FOUR, USING LEFT INTERNAL MAMMARY ARTERY AND ENDOSCOPICALLY HARVESTED RIGHT GREATER SAPHENOUS VEIN (N/A) TRANSESOPHAGEAL ECHOCARDIOGRAM (TEE) (N/A) Subjective: Feels okay no issues.   Objective: Vital signs in last 24 hours: Temp:  [98.1 F (36.7 C)-98.7 F (37.1 C)] 98.7 F (37.1 C) (02/06 1109) Pulse Rate:  [75-89] 89 (02/06 1109) Cardiac Rhythm: Normal sinus rhythm (02/06 0724) Resp:  [18-24] 18 (02/06 1109) BP: (90-116)/(52-68) 108/66 (02/06 1109) SpO2:  [92 %-99 %] 99 % (02/06 1109) Weight:  [88.3 kg] 88.3 kg (02/06 0500)     Intake/Output from previous day: 02/05 0701 - 02/06 0700 In: 1130.3 [P.O.:920; I.V.:10.3; IV Piggyback:200] Out: 3775 [Urine:3775] Intake/Output this shift: Total I/O In: 240 [P.O.:240] Out: 0   General appearance: alert, cooperative and no distress Heart: regular rate and rhythm, S1, S2 normal, no murmur, click, rub or gallop Lungs: clear to auscultation bilaterally Abdomen: soft, non-tender; bowel sounds normal; no masses,  no organomegaly Extremities: extremities normal, atraumatic, no cyanosis or edema Wound: clean and dry  Lab Results: Recent Labs    10/28/20 0434 10/29/20 0145  WBC 10.4 9.1  HGB 9.0* 9.1*  HCT 27.4* 27.8*  PLT 158 155   BMET:  Recent Labs    10/28/20 0434 10/29/20 0145  NA 134* 137  K 4.2 3.5  CL 101 98  CO2 25 30  GLUCOSE 103* 113*  BUN 13 13  CREATININE 0.55 0.74  CALCIUM 8.9 8.8*    PT/INR:  Recent Labs    10/26/20 1330  LABPROT 16.9*  INR 1.4*   ABG    Component Value Date/Time   PHART 7.302 (L) 10/26/2020 1807   HCO3 21.0 10/26/2020 1807   TCO2 22 10/26/2020 1807   ACIDBASEDEF 5.0 (H) 10/26/2020 1807   O2SAT 93.0 10/26/2020 1807   CBG (last 3)  Recent Labs    10/29/20 0338 10/29/20 0721  10/29/20 1123  GLUCAP 112* 134* 133*    Assessment/Plan: S/P Procedure(s) (LRB): CORONARY ARTERY BYPASS GRAFTING (CABG), ON PUMP, TIMES FOUR, USING LEFT INTERNAL MAMMARY ARTERY AND ENDOSCOPICALLY HARVESTED RIGHT GREATER SAPHENOUS VEIN (N/A) TRANSESOPHAGEAL ECHOCARDIOGRAM (TEE) (N/A)  1. CV-NSR in the 80s, BP well controlled. Continue asa, statin, and lopressor  2. Pulm-tolerating 2L with good oxygen saturation. cxr shows: Low lung volumes with left greater than right lower lobe atelectasis. Small left pleural effusion 3. Creatinine 0.74, will replace potassium  4. H and H 9.1/27.8, stable 5. Endo-blood glucose well controlled  Plan: Continue diuretics for fluid overload. Ambulate in the halls. Continue to use incentive spirometer and weaning of oxygen.    LOS: 4 days    Sue Baker 10/29/2020 Pt seen and examined; agree with PA documentation including assessment and plan. Editha Bridgeforth Z. Orvan Seen, [place]

## 2020-10-29 NOTE — Plan of Care (Signed)
  Problem: Education: Goal: Ability to demonstrate management of disease process will improve Outcome: Progressing   Problem: Education: Goal: Ability to verbalize understanding of medication therapies will improve Outcome: Progressing   Problem: Activity: Goal: Capacity to carry out activities will improve Outcome: Progressing   Problem: Education: Goal: Knowledge of General Education information will improve Description: Including pain rating scale, medication(s)/side effects and non-pharmacologic comfort measures Outcome: Progressing   Problem: Clinical Measurements: Goal: Ability to maintain clinical measurements within normal limits will improve Outcome: Progressing   Problem: Clinical Measurements: Goal: Will remain free from infection Outcome: Progressing   Problem: Clinical Measurements: Goal: Cardiovascular complication will be avoided Outcome: Progressing   Problem: Activity: Goal: Risk for activity intolerance will decrease Outcome: Progressing   Problem: Nutrition: Goal: Adequate nutrition will be maintained Outcome: Progressing   Problem: Pain Managment: Goal: General experience of comfort will improve Outcome: Progressing   Problem: Skin Integrity: Goal: Risk for impaired skin integrity will decrease Outcome: Progressing

## 2020-10-30 DIAGNOSIS — Z794 Long term (current) use of insulin: Secondary | ICD-10-CM | POA: Diagnosis not present

## 2020-10-30 DIAGNOSIS — Z951 Presence of aortocoronary bypass graft: Secondary | ICD-10-CM | POA: Diagnosis not present

## 2020-10-30 DIAGNOSIS — E118 Type 2 diabetes mellitus with unspecified complications: Secondary | ICD-10-CM | POA: Diagnosis not present

## 2020-10-30 LAB — GLUCOSE, CAPILLARY
Glucose-Capillary: 103 mg/dL — ABNORMAL HIGH (ref 70–99)
Glucose-Capillary: 116 mg/dL — ABNORMAL HIGH (ref 70–99)
Glucose-Capillary: 178 mg/dL — ABNORMAL HIGH (ref 70–99)
Glucose-Capillary: 107 mg/dL — ABNORMAL HIGH (ref 70–99)

## 2020-10-30 NOTE — Progress Notes (Signed)
CARDIAC REHAB PHASE I   PRE:  Rate/Rhythm: 82 SR    BP: sitting 101/64    SaO2: 94 2L, 92 RA  MODE:  Ambulation: 410 ft   POST:  Rate/Rhythm: 94 SR with PACs    BP: sitting 122/64     SaO2: 92 RA  Pt with min assist getting OOB. Walked with RW, contact guard. No c/o, no O2 needed walking, increased distance. To recliner, practiced IS, 750 mL. Encouraged x2 more walks today and IS. Left on RA. Has RW for home. [place], ACSM 10/30/2020 9:10 AM

## 2020-10-30 NOTE — Evaluation (Signed)
Occupational Therapy Evaluation Patient Details Name: Sue Baker MRN: [id number] DOB: 07-11-1956 Today's Date: 10/30/2020    History of Present Illness 65 year old with history of diabetes and strong family history of coronary artery disease presents with a left heart cath showing significant stenosis. s/p 2/3 CABGx4.   Clinical Impression   Pt typically independent in ADL/IADL and mobility. 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 She is still driving. Today she was overall min guard for transfers (did not mobilize due to lunch arriving). Pt able to demonstrate figure 4 for LB ADL and Pt educated on sternal precautions and provided with handout. Educated for compensatory strategies to complete ADL while maintaining sternal precautions. Pt on RA throughout session and VSS. OT will follow acutely, do not anticipate the need for post-acute therapy.    Follow Up Recommendations  No OT follow up;Supervision - Intermittent    Equipment Recommendations  None recommended by OT (Pt has appropriate DME)    Recommendations for Other Services       Precautions / Restrictions Precautions Precautions: Fall;Sternal Precaution Booklet Issued: Yes (comment) Precaution Comments: wound vac on sternal incision Restrictions Weight Bearing Restrictions: Yes (Sternal) Other Position/Activity Restrictions: sternal precautions and "move in the tube" handout provided to Pt      Mobility Bed Mobility               General bed mobility comments: in recliner at beginning and end of session    Transfers Overall transfer level: Needs assistance Equipment used: None Transfers: Sit to/from Stand Sit to Stand: Min guard (line management only)         General transfer comment: sit<>stand multiple times from recliner without assist or DME - only needs assist for line management - did not perform any mobility this session    Balance Overall  balance assessment: No apparent balance deficits (not formally assessed) (for sit<>stand only)                                         ADL either performed or assessed with clinical judgement   ADL Overall ADL's : Needs assistance/impaired Eating/Feeding: Modified independent;Sitting   Grooming: Modified independent;Sitting Grooming Details (indicate cue type and reason): NOT tested standing at sink Upper Body Bathing: Minimal assistance   Lower Body Bathing: Min guard;Sitting/lateral leans Lower Body Bathing Details (indicate cue type and reason): has a shower seat already Upper Body Dressing : Minimal assistance;Sitting   Lower Body Dressing: Minimal assistance;Sit to/from stand Lower Body Dressing Details (indicate cue type and reason): educated on "smart" clothing choices to make ADLs easier Toilet Transfer: Min guard Toilet Transfer Details (indicate cue type and reason): assist for lines, no physical assist needed Toileting- Clothing Manipulation and Hygiene: Minimal assistance;Cueing for safety;Sit to/from stand Toileting - Clothing Manipulation Details (indicate cue type and reason): educated Pt on compensatory strategies Tub/ Shower Transfer: Walk-in shower;Ambulation;Min guard   Functional mobility during ADLs: Min guard (sit<>stand multiple times this session) General ADL Comments: needs continued education on sternal precautions with application for ADL, handout provided and reviewed, educated on dressing/bathing,     Vision Baseline Vision/History: Wears glasses Wears Glasses: At all times Patient Visual Report: No change from baseline Vision Assessment?: No apparent visual deficits     Perception     Praxis      Pertinent Vitals/Pain Pain Assessment: Faces Faces Pain Scale:  Hurts a little bit Pain Location: chest incision Pain Descriptors / Indicators: Discomfort;Sore Pain Intervention(s): Limited activity within patient's tolerance;Monitored  during session     Hand Dominance Right   Extremity/Trunk Assessment Upper Extremity Assessment Upper Extremity Assessment: Overall WFL for tasks assessed   Lower Extremity Assessment Lower Extremity Assessment: Overall WFL for tasks assessed   Cervical / Trunk Assessment Cervical / Trunk Assessment: Other exceptions Cervical / Trunk Exceptions: sternal precautions and wound vac   Communication Communication Communication: No difficulties   Cognition Arousal/Alertness: Awake/alert Behavior During Therapy: WFL for tasks assessed/performed Overall Cognitive Status: Within Functional Limits for tasks assessed                                     General Comments  VSS throughout session    Exercises     Shoulder Instructions      Home Living Family/patient expects to be discharged to:: Private residence Living Arrangements: Children Available Help at Discharge: Family;Available 24 hours/day (daughter and 74 y/o grandson) Type of Home: House Home Access: Stairs to enter [employer] of Steps: 1 Entrance Stairs-Rails: None Home Layout: One level     Bathroom Shower/Tub: Walk-in shower;Door   [employer] Toilet: Handicapped height Bathroom Accessibility: Yes How Accessible: Accessible via walker Home Equipment: [place] - 2 wheels;Cane - quad;Shower seat;Hand held shower head          Prior Functioning/Environment Level of Independence: Independent        Comments: drives, works at [employer]        OT Problem List: Decreased activity tolerance;Impaired balance (sitting and/or standing);Decreased knowledge of use of DME or AE;Decreased knowledge of precautions;Cardiopulmonary status limiting activity;Pain      OT Treatment/Interventions: Self-care/ADL training;DME and/or AE instruction;Energy conservation;Therapeutic activities;Patient/family education;Balance training    OT Goals(Current goals can be found in the care plan section) Acute Rehab  OT Goals Patient Stated Goal: get back to independent/work OT Goal Formulation: With patient Time For Goal Achievement: 11/13/20 Potential to Achieve Goals: Good ADL Goals Pt Will Perform Grooming: with modified independence;standing Pt Will Perform Upper Body Dressing: with modified independence;sitting Pt Will Perform Lower Body Dressing: with modified independence;sit to/from stand Pt Will Transfer to Toilet: with modified independence;ambulating Pt Will Perform Toileting - Clothing Manipulation and hygiene: with modified independence;sit to/from stand;with adaptive equipment Additional ADL Goal #1: Pt will demonstrate safe sternal precautions during bed mobility at mod I level prior to engaging in ADL  OT Frequency: Min 2X/week   Barriers to D/C:            Co-evaluation              AM-PAC OT "6 Clicks" Daily Activity     Outcome Measure Help from another person eating meals?: None Help from another person taking care of personal grooming?: A Little Help from another person toileting, which includes using toliet, bedpan, or urinal?: A Little Help from another person bathing (including washing, rinsing, drying)?: A Little Help from another person to put on and taking off regular upper body clothing?: A Little Help from another person to put on and taking off regular lower body clothing?: A Little 6 Click Score: 19   End of Session Nurse Communication: Mobility status;Precautions  Activity Tolerance: Patient tolerated treatment well Patient left: in chair;with call bell/phone within reach  OT Visit Diagnosis: Unsteadiness on feet (R26.81);Muscle weakness (generalized) (M62.81);Pain Pain - Right/Left:  (central) Pain -  part of body:  (sternal incision)                Time: 5638-7564 OT Time Calculation (min): 30 min Charges:  OT General Charges $OT Visit: 1 Visit OT Evaluation $OT Eval Moderate Complexity: 1 Mod OT Treatments $Self Care/Home Management : 8-22  mins  Jesse Sans OTR/L Acute Rehabilitation Services Pager: [phone] Office: [place] 10/30/2020, 1:24 PM

## 2020-10-30 NOTE — Plan of Care (Signed)

## 2020-10-30 NOTE — Plan of Care (Signed)
  Problem: Education: Goal: Ability to demonstrate management of disease process will improve Outcome: Progressing   Problem: Education: Goal: Ability to verbalize understanding of medication therapies will improve Outcome: Progressing   Problem: Activity: Goal: Capacity to carry out activities will improve Outcome: Progressing   Problem: Cardiac: Goal: Ability to achieve and maintain adequate cardiopulmonary perfusion will improve Outcome: Progressing   Problem: Education: Goal: Knowledge of General Education information will improve Description: Including pain rating scale, medication(s)/side effects and non-pharmacologic comfort measures Outcome: Progressing   Problem: Health Behavior/Discharge Planning: Goal: Ability to manage health-related needs will improve Outcome: Progressing   Problem: Clinical Measurements: Goal: Cardiovascular complication will be avoided Outcome: Progressing   Problem: Activity: Goal: Risk for activity intolerance will decrease Outcome: Progressing   Problem: Pain Managment: Goal: General experience of comfort will improve Outcome: Progressing   Problem: Skin Integrity: Goal: Risk for impaired skin integrity will decrease Outcome: Progressing

## 2020-10-30 NOTE — Progress Notes (Signed)
Progress Note  Patient Name: Sue Baker Date of Encounter: 10/30/2020  Primary Cardiologist:   Shelva Majestic, MD   Subjective   She denies pain.  She is working on her breathing.   Inpatient Medications    Scheduled Meds: . acetaminophen  1,000 mg Oral Q6H   Or  . acetaminophen (TYLENOL) oral liquid 160 mg/5 mL  1,000 mg Per Tube Q6H  . aspirin EC  325 mg Oral Daily   Or  . aspirin  324 mg Per Tube Daily  . bisacodyl  10 mg Oral Daily   Or  . bisacodyl  10 mg Rectal Daily  . chlorhexidine  15 mL Mouth Rinse BID  . Chlorhexidine Gluconate Cloth  6 each Topical Daily  . docusate sodium  200 mg Oral Daily  . enoxaparin (LOVENOX) injection  40 mg Subcutaneous QHS  . escitalopram  10 mg Oral Daily  . fenofibrate  160 mg Oral Daily  . insulin aspart  0-24 Units Subcutaneous TID AC & HS  . insulin detemir  10 Units Subcutaneous Daily  . levothyroxine  150 mcg Oral QAC breakfast  . mouth rinse  15 mL Mouth Rinse q12n4p  . metoprolol tartrate  12.5 mg Oral BID   Or  . metoprolol tartrate  12.5 mg Per Tube BID  . pantoprazole  40 mg Oral Daily  . rosuvastatin  40 mg Oral Daily  . sodium chloride flush  10-40 mL Intracatheter Q12H  . sodium chloride flush  3 mL Intravenous Q12H   Continuous Infusions: . sodium chloride 20 mL/hr at 10/26/20 1310  . sodium chloride    . sodium chloride    . insulin Stopped (10/27/20 1237)  . lactated ringers    . lactated ringers Stopped (10/26/20 1310)  . lactated ringers 20 mL/hr at 10/26/20 1310   PRN Meds: sodium chloride, dextrose, lactated ringers, metoprolol tartrate, ondansetron (ZOFRAN) IV, oxyCODONE, sodium chloride flush, sodium chloride flush, traMADol   Vital Signs    Vitals:   10/29/20 2321 10/30/20 0335 10/30/20 0700 10/30/20 0740  BP: 111/61 102/64 (!) 103/58 117/73  Pulse: 96 80  82  Resp: 20 20  14   Temp: 98.4 F (36.9 C) 98.2 F (36.8 C)  99 F (37.2 C)  TempSrc: Oral Oral  Oral  SpO2: 93% 94%  95%  Weight:   88.6 kg    Height:        Intake/Output Summary (Last 24 hours) at 10/30/2020 0851 Last data filed at 10/29/2020 1540 Gross per 24 hour  Intake 720 ml  Output 1200 ml  Net -480 ml   Filed Weights   10/28/20 0500 10/29/20 0500 10/30/20 0335  Weight: 93 kg 88.3 kg 88.6 kg    Telemetry    NSR, PACs - Personally Reviewed  ECG    NA - Personally Reviewed  Physical Exam   GEN: No acute distress.   Neck: No  JVD Cardiac: RRR, no murmurs, rubs, or gallops.  Respiratory:     Decreased breath sounds at both bases.  GI: Soft, nontender, non-distended  MS: No   edema; No deformity. Neuro:  Nonfocal  Psych: Normal affect   Labs    Chemistry Recent Labs  Lab 10/26/20 0557 10/26/20 0803 10/27/20 1632 10/28/20 0434 10/29/20 0145  NA 140   < > 137 134* 137  K 4.1   < > 4.1 4.2 3.5  CL 106   < > 104 101 98  CO2 25   < > 24  25 30  GLUCOSE 112*   < > 96 103* 113*  BUN 13   < > 13 13 13   CREATININE 0.75   < > 0.63 0.55 0.74  CALCIUM 9.0   < > 8.9 8.9 8.8*  PROT 6.1*  --   --   --   --   ALBUMIN 3.5  --   --   --   --   AST 21  --   --   --   --   ALT 22  --   --   --   --   ALKPHOS 52  --   --   --   --   BILITOT 1.0  --   --   --   --   GFRNONAA >60   < > >60 >60 >60  ANIONGAP 9   < > 9 8 9    < > = values in this interval not displayed.     Hematology Recent Labs  Lab 10/27/20 1632 10/28/20 0434 10/29/20 0145  WBC 9.8 10.4 9.1  RBC 2.84* 2.73* 2.82*  HGB 9.1* 9.0* 9.1*  HCT 28.6* 27.4* 27.8*  MCV 100.7* 100.4* 98.6  MCH 32.0 33.0 32.3  MCHC 31.8 32.8 32.7  RDW 12.6 12.8 12.2  PLT 158 158 155    Cardiac EnzymesNo results for input(s): TROPONINI in the last 168 hours. No results for input(s): TROPIPOC in the last 168 hours.   BNPNo results for input(s): BNP, PROBNP in the last 168 hours.   DDimer No results for input(s): DDIMER in the last 168 hours.   Radiology    DG Chest 2 View  Result Date: 10/29/2020 CLINICAL DATA:  65 year old female  postoperative day 3 status post CABG. EXAM: CHEST - 2 VIEW COMPARISON:  Portable chest 10/28/2020 and earlier. FINDINGS: Right IJ sheath removed. No chest or mediastinal tubes remain. Sequelae of CABG. Lower stable mediastinal lung volumes contours with left greater than right confluent retrocardiac opacity. Small left veiling opacity. No pulmonary edema. No definite pneumothorax. Sequelae of median sternotomy.  Negative visible bowel gas pattern. IMPRESSION: 1. Lines and tubes removed. 2. Low lung volumes with left greater than right lower lobe atelectasis. Small left pleural effusion. Electronically Signed   By: Genevie Ann M.D.   On: 10/29/2020 08:21    Cardiac Studies   Echo:  1. Left ventricular ejection fraction, by estimation, is 60 to 65%. The left ventricle has normal function. The left ventricle has no regional wall motion abnormalities. There is mild left ventricular hypertrophy. Left ventricular diastolic parameters were normal. 2. Right ventricular systolic function is normal. The right ventricular size is normal. Tricuspid regurgitation signal is inadequate for assessing PA pressure. 3. The mitral valve is normal in structure. No evidence of mitral valve regurgitation. No evidence of mitral stenosis. 4. The aortic valve was not well visualized. Aortic valve regurgitation is not visualized. No aortic stenosis is present. 5. The inferior vena cava is normal in size with greater than 50% respiratory variability, suggesting right atrial pressure of 3 mmHg.   Diagnostic Dominance: Right      Patient Profile     65 y.o. female who is a long-standing history of hypertension, hyperlipidemia, diabetes mellitus, hypothyroidism, depression, as well as obstructive sleep apnea.   She had exertional angina and was found to have disease as above and now is status post CABG.   Assessment & Plan    CAD/CABG:  CABG x 4  VOLUME OVERLOAD:  CXR with  atelectasis and small effusion.  Continuing  diuresis.  Needs continued pulmonary toilet with incentive spirometry.    HTN:  BP well controlled  DYSLIPIDEMIA:  Continue Crestor  DM:  A1C 10 months ago was 8.9.  BS have been elevated this admission.  I would suggest restart previous metformin and Jardiance doses at discharge.      CARDIOLOGY RECOMMENDATIONS:  Discharge is anticipated in the next 48 hours. Recommendations for medications and follow up:  Discharge Medications: Continue medications as they are currently listed in the [facility]. Exceptions to the above:  Restart metformin and Jardiance at discharge.   Follow Up: The patient's Primary Cardiologist is Shelva Majestic, MD   Follow up in the office in our office on Feb 22.    Signed,  Minus Breeding, MD  8:51 AM 10/30/2020  [facility]   For questions or updates, please contact [facility] Please consult [email or web address] for contact info under Cardiology/STEMI.   Signed, Minus Breeding, MD  10/30/2020, 8:51 AM

## 2020-10-30 NOTE — Progress Notes (Signed)
[address]       [place], [address]             [phone]      4 Days Post-Op Procedure(s) (LRB): CORONARY ARTERY BYPASS GRAFTING (CABG), ON PUMP, TIMES FOUR, USING LEFT INTERNAL MAMMARY ARTERY AND ENDOSCOPICALLY HARVESTED RIGHT GREATER SAPHENOUS VEIN (N/A) TRANSESOPHAGEAL ECHOCARDIOGRAM (TEE) (N/A) Subjective: Feels well overall. Earlier nausea has resolved  Objective: Vital signs in last 24 hours: Temp:  [98.2 F (36.8 C)-99 F (37.2 C)] 99 F (37.2 C) (02/07 0740) Pulse Rate:  [80-96] 82 (02/07 0740) Cardiac Rhythm: Normal sinus rhythm (02/07 0732) Resp:  [14-20] 14 (02/07 0740) BP: (102-117)/(58-73) 117/73 (02/07 0740) SpO2:  [90 %-99 %] 95 % (02/07 0740) Weight:  [88.6 kg] 88.6 kg (02/07 0335)  Hemodynamic parameters for last 24 hours:    Intake/Output from previous day: 02/06 0701 - 02/07 0700 In: 960 [P.O.:960] Out: 1200 [Urine:1200] Intake/Output this shift: No intake/output data recorded.  General appearance: alert, cooperative and no distress Heart: regular rate and rhythm Lungs: clear to auscultation bilaterally Abdomen: benign  Ext: no edema Incis healing well (EVH), provena in place  Lab Results: Recent Labs    10/28/20 0434 10/29/20 0145  WBC 10.4 9.1  HGB 9.0* 9.1*  HCT 27.4* 27.8*  PLT 158 155   BMET:  Recent Labs    10/28/20 0434 10/29/20 0145  NA 134* 137  K 4.2 3.5  CL 101 98  CO2 25 30  GLUCOSE 103* 113*  BUN 13 13  CREATININE 0.55 0.74  CALCIUM 8.9 8.8*    PT/INR: No results for input(s): LABPROT, INR in the last 72 hours. ABG    Component Value Date/Time   PHART 7.302 (L) 10/26/2020 1807   HCO3 21.0 10/26/2020 1807   TCO2 22 10/26/2020 1807   ACIDBASEDEF 5.0 (H) 10/26/2020 1807   O2SAT 93.0 10/26/2020 1807   CBG (last 3)  Recent Labs    10/29/20 1525 10/29/20 2109 10/30/20 0613  GLUCAP 143* 117* 103*    Meds Scheduled Meds: . acetaminophen  1,000 mg Oral Q6H   Or  .  acetaminophen (TYLENOL) oral liquid 160 mg/5 mL  1,000 mg Per Tube Q6H  . aspirin EC  325 mg Oral Daily   Or  . aspirin  324 mg Per Tube Daily  . bisacodyl  10 mg Oral Daily   Or  . bisacodyl  10 mg Rectal Daily  . chlorhexidine  15 mL Mouth Rinse BID  . Chlorhexidine Gluconate Cloth  6 each Topical Daily  . docusate sodium  200 mg Oral Daily  . enoxaparin (LOVENOX) injection  40 mg Subcutaneous QHS  . escitalopram  10 mg Oral Daily  . fenofibrate  160 mg Oral Daily  . furosemide  40 mg Intravenous BID  . insulin aspart  0-24 Units Subcutaneous TID AC & HS  . insulin detemir  10 Units Subcutaneous Daily  . levothyroxine  150 mcg Oral QAC breakfast  . mouth rinse  15 mL Mouth Rinse q12n4p  . metoprolol tartrate  12.5 mg Oral BID   Or  . metoprolol tartrate  12.5 mg Per Tube BID  . pantoprazole  40 mg Oral Daily  . rosuvastatin  40 mg Oral Daily  . sodium chloride flush  10-40 mL Intracatheter Q12H  . sodium chloride flush  3 mL Intravenous Q12H   Continuous Infusions: . sodium chloride 20 mL/hr at 10/26/20 1310  . sodium chloride    .  sodium chloride    . insulin Stopped (10/27/20 1237)  . lactated ringers    . lactated ringers Stopped (10/26/20 1310)  . lactated ringers 20 mL/hr at 10/26/20 1310   PRN Meds:.sodium chloride, dextrose, lactated ringers, metoprolol tartrate, ondansetron (ZOFRAN) IV, oxyCODONE, sodium chloride flush, sodium chloride flush, traMADol  Xrays DG Chest 2 View  Result Date: 10/29/2020 CLINICAL DATA:  65 year old female postoperative day 3 status post CABG. EXAM: CHEST - 2 VIEW COMPARISON:  Portable chest 10/28/2020 and earlier. FINDINGS: Right IJ sheath removed. No chest or mediastinal tubes remain. Sequelae of CABG. Lower stable mediastinal lung volumes contours with left greater than right confluent retrocardiac opacity. Small left veiling opacity. No pulmonary edema. No definite pneumothorax. Sequelae of median sternotomy.  Negative visible bowel gas  pattern. IMPRESSION: 1. Lines and tubes removed. 2. Low lung volumes with left greater than right lower lobe atelectasis. Small left pleural effusion. Electronically Signed   By: Genevie Ann M.D.   On: 10/29/2020 08:21    Assessment/Plan: S/P Procedure(s) (LRB): CORONARY ARTERY BYPASS GRAFTING (CABG), ON PUMP, TIMES FOUR, USING LEFT INTERNAL MAMMARY ARTERY AND ENDOSCOPICALLY HARVESTED RIGHT GREATER SAPHENOUS VEIN (N/A) TRANSESOPHAGEAL ECHOCARDIOGRAM (TEE) (N/A)   1 Tmax 99, VSS, sinus rhythm, ? Vent bigem- small amt 2 sats good on 2 l HFNC- wean off 3 no new labs 4 BS well controlled 5 good UOP, weight now below preop- will stop diuretics 6 routine pulm toilet and rehab 7 prob home in am     LOS: 5 days    John Giovanni PA-C Pager [phone] 10/30/2020

## 2020-10-30 NOTE — Evaluation (Signed)
Physical Therapy Evaluation Patient Details Name: Sue Baker MRN: [id number] DOB: 07-06-1956 Today's Date: 10/30/2020   History of Present Illness  65 year old with history of diabetes and strong family history of coronary artery disease presents with a left heart cath showing significant stenosis. s/p 2/3 CABGx4.  Clinical Impression  PTA pt living with daughter and grandson in single story home with level entry. Pt completely independent working for [employer]. Pt limited in safe mobility by sternal precautions and wound vac on sternal incision, in presence of decreased endurance. Pt is min guard for transfers and ambulation of 400 feet with RW. Pt able to recall 2/3 sternal precautions from OT education this morning. Pt will not have any further need for PT services at discharge, however PT will continue to see acutely to progress mobility.     Follow Up Recommendations No PT follow up;Supervision for mobility/OOB    Equipment Recommendations  Rolling walker with 5" wheels       Precautions / Restrictions Precautions Precautions: Fall;Sternal Precaution Booklet Issued: Yes (comment) Precaution Comments: wound vac on sternal incision Restrictions Weight Bearing Restrictions: Yes Other Position/Activity Restrictions: sternal precautions and "move in the tube" handout provided to Pt      Mobility  Bed Mobility               General bed mobility comments: in recliner at beginning and end of session    Transfers Overall transfer level: Needs assistance Equipment used: None Transfers: Sit to/from Stand Sit to Stand: Min guard         General transfer comment: min guard for safety, and line management  Ambulation/Gait Ambulation/Gait assistance: Min guard Gait Distance (Feet): 400 Feet Assistive device: Rolling walker (2 wheeled) Gait Pattern/deviations: Step-through pattern;WFL(Within Functional Limits) Gait velocity: slowed Gait velocity interpretation: <1.31 ft/sec,  indicative of household ambulator General Gait Details: min guard for safety with RW, slow, steady gait, assist for line management only         Balance Overall balance assessment: Mild deficits observed, not formally tested                                           Pertinent Vitals/Pain Pain Assessment: Faces Faces Pain Scale: Hurts a little bit Pain Location: chest incision Pain Descriptors / Indicators: Discomfort;Sore Pain Intervention(s): Limited activity within patient's tolerance;Monitored during session;Repositioned    Home Living Family/patient expects to be discharged to:: Private residence Living Arrangements: Children Available Help at Discharge: Family;Available 24 hours/day (daughter and 60 y/o grandson) Type of Home: House Home Access: Stairs to enter Entrance Stairs-Rails: None Entrance Stairs-Number of Steps: 1 Home Layout: One level Home Equipment: Environmental consultant - 2 wheels;Cane - quad;Shower seat;Hand held shower head      Prior Function Level of Independence: Independent         Comments: drives, works at [employer]     [place]: Right    Extremity/Trunk Assessment   Upper Extremity Assessment Upper Extremity Assessment: Overall WFL for tasks assessed    Lower Extremity Assessment Lower Extremity Assessment: Overall WFL for tasks assessed    Cervical / Trunk Assessment Cervical / Trunk Assessment: Other exceptions Cervical / Trunk Exceptions: sternal precautions and wound vac  Communication   Communication: No difficulties  Cognition Arousal/Alertness: Awake/alert Behavior During Therapy: WFL for tasks assessed/performed Overall Cognitive Status: Within Functional Limits for tasks assessed  General Comments General comments (skin integrity, edema, etc.): VSS on RA        Assessment/Plan    PT Assessment Patient needs continued PT services  PT  Problem List Decreased activity tolerance;Cardiopulmonary status limiting activity       PT Treatment Interventions Gait training;DME instruction;Functional mobility training;Therapeutic activities;Balance training;Therapeutic exercise;Cognitive remediation;Patient/family education    PT Goals (Current goals can be found in the Care Plan section)  Acute Rehab PT Goals Patient Stated Goal: get back to independent/work PT Goal Formulation: With patient Time For Goal Achievement: 11/13/20 Potential to Achieve Goals: Good    Frequency Min 3X/week    AM-PAC PT "6 Clicks" Mobility  Outcome Measure Help needed turning from your back to your side while in a flat bed without using bedrails?: None Help needed moving from lying on your back to sitting on the side of a flat bed without using bedrails?: None Help needed moving to and from a bed to a chair (including a wheelchair)?: None Help needed standing up from a chair using your arms (e.g., wheelchair or bedside chair)?: None Help needed to walk in hospital room?: None Help needed climbing 3-5 steps with a railing? : Total 6 Click Score: 21    End of Session   Activity Tolerance: Patient tolerated treatment well Patient left: in chair;with call bell/phone within reach Nurse Communication: Mobility status PT Visit Diagnosis: Other abnormalities of gait and mobility (R26.89)    Time: 2694-8546 PT Time Calculation (min) (ACUTE ONLY): 21 min   Charges:   PT Evaluation $PT Eval Moderate Complexity: 1 Mod          Oval Moralez B. Migdalia Dk PT, DPT Acute Rehabilitation Services Pager [phone] Office [phone]   [place] 10/30/2020, 4:32 PM

## 2020-10-31 LAB — BASIC METABOLIC PANEL
Anion gap: 12 (ref 5–15)
BUN: 14 mg/dL (ref 8–23)
CO2: 27 mmol/L (ref 22–32)
Calcium: 9.1 mg/dL (ref 8.9–10.3)
Chloride: 99 mmol/L (ref 98–111)
Creatinine, Ser: 0.63 mg/dL (ref 0.44–1.00)
GFR, Estimated: >60 mL/min (ref >60)
Glucose, Bld: 124 mg/dL — ABNORMAL HIGH (ref 70–99)
Potassium: 3.8 mmol/L (ref 3.5–5.1)
Sodium: 138 mmol/L (ref 135–145)

## 2020-10-31 LAB — GLUCOSE, CAPILLARY: Glucose-Capillary: 117 mg/dL — ABNORMAL HIGH (ref 70–99)

## 2020-10-31 MED ORDER — TRAMADOL HCL 50 MG PO TABS: 50.0000 mg | ORAL_TABLET | Freq: Four times a day (QID) | ORAL | 0 refills | Status: DC | PRN

## 2020-10-31 MED ORDER — ASPIRIN 325 MG PO TBEC: 325.0000 mg | DELAYED_RELEASE_TABLET | Freq: Every day | ORAL | Status: AC

## 2020-10-31 MED ORDER — LEVOTHYROXINE SODIUM 150 MCG PO TABS: 150.0000 ug | ORAL_TABLET | Freq: Every day | ORAL | Status: AC

## 2020-10-31 MED ORDER — METOPROLOL SUCCINATE ER 25 MG PO TB24: 12.5000 mg | ORAL_TABLET | Freq: Two times a day (BID) | ORAL | Status: AC

## 2020-10-31 NOTE — Progress Notes (Signed)
[address]       [place] [address]             [phone]      5 Days Post-Op Procedure(s) (LRB): CORONARY ARTERY BYPASS GRAFTING (CABG), ON PUMP, TIMES FOUR, USING LEFT INTERNAL MAMMARY ARTERY AND ENDOSCOPICALLY HARVESTED RIGHT GREATER SAPHENOUS VEIN (N/A) TRANSESOPHAGEAL ECHOCARDIOGRAM (TEE) (N/A) Subjective:  feels well  Objective: Vital signs in last 24 hours: Temp:  [98.5 F (36.9 C)-99 F (37.2 C)] 98.5 F (36.9 C) (02/08 0444) Pulse Rate:  [77-86] 82 (02/08 0444) Cardiac Rhythm: Normal sinus rhythm (02/08 0444) Resp:  [14-23] 19 (02/08 0444) BP: (106-120)/(55-73) 115/62 (02/08 0444) SpO2:  [91 %-95 %] 95 % (02/08 0444) Weight:  [87.6 kg] 87.6 kg (02/08 0444)  Hemodynamic parameters for last 24 hours:    Intake/Output from previous day: 02/07 0701 - 02/08 0700 In: 960 [P.O.:960] Out: -  Intake/Output this shift: No intake/output data recorded.  General appearance: alert, cooperative and no distress Heart: regular rate and rhythm Lungs: clear to auscultation bilaterally Abdomen: benign Extremities: no edema Wound: incis healing well  Lab Results: Recent Labs    10/29/20 0145  WBC 9.1  HGB 9.1*  HCT 27.8*  PLT 155   BMET:  Recent Labs    10/29/20 0145 10/31/20 0048  NA 137 138  K 3.5 3.8  CL 98 99  CO2 30 27  GLUCOSE 113* 124*  BUN 13 14  CREATININE 0.74 0.63  CALCIUM 8.8* 9.1    PT/INR: No results for input(s): LABPROT, INR in the last 72 hours. ABG    Component Value Date/Time   PHART 7.302 (L) 10/26/2020 1807   HCO3 21.0 10/26/2020 1807   TCO2 22 10/26/2020 1807   ACIDBASEDEF 5.0 (H) 10/26/2020 1807   O2SAT 93.0 10/26/2020 1807   CBG (last 3)  Recent Labs    10/30/20 1550 10/30/20 2121 10/31/20 0614  GLUCAP 178* 107* 117*    Meds Scheduled Meds: . acetaminophen  1,000 mg Oral Q6H   Or  . acetaminophen (TYLENOL) oral liquid 160 mg/5 mL  1,000 mg Per Tube Q6H  . aspirin EC  325 mg Oral Daily    Or  . aspirin  324 mg Per Tube Daily  . bisacodyl  10 mg Oral Daily   Or  . bisacodyl  10 mg Rectal Daily  . chlorhexidine  15 mL Mouth Rinse BID  . Chlorhexidine Gluconate Cloth  6 each Topical Daily  . docusate sodium  200 mg Oral Daily  . enoxaparin (LOVENOX) injection  40 mg Subcutaneous QHS  . escitalopram  10 mg Oral Daily  . fenofibrate  160 mg Oral Daily  . insulin aspart  0-24 Units Subcutaneous TID AC & HS  . insulin detemir  10 Units Subcutaneous Daily  . levothyroxine  150 mcg Oral QAC breakfast  . mouth rinse  15 mL Mouth Rinse q12n4p  . metoprolol tartrate  12.5 mg Oral BID   Or  . metoprolol tartrate  12.5 mg Per Tube BID  . pantoprazole  40 mg Oral Daily  . rosuvastatin  40 mg Oral Daily  . sodium chloride flush  10-40 mL Intracatheter Q12H  . sodium chloride flush  3 mL Intravenous Q12H   Continuous Infusions: . sodium chloride Stopped (10/30/20 1500)  . sodium chloride    . sodium chloride    . insulin Stopped (10/27/20 1237)  . lactated ringers Stopped (10/30/20 1500)  . lactated ringers  Stopped (10/26/20 1310)  . lactated ringers Stopped (10/30/20 1500)   PRN Meds:.sodium chloride, dextrose, lactated ringers, metoprolol tartrate, ondansetron (ZOFRAN) IV, oxyCODONE, sodium chloride flush, sodium chloride flush, traMADol  Xrays No results found.  Assessment/Plan: S/P Procedure(s) (LRB): CORONARY ARTERY BYPASS GRAFTING (CABG), ON PUMP, TIMES FOUR, USING LEFT INTERNAL MAMMARY ARTERY AND ENDOSCOPICALLY HARVESTED RIGHT GREATER SAPHENOUS VEIN (N/A) TRANSESOPHAGEAL ECHOCARDIOGRAM (TEE) (N/A)  1 afeb, VSS 2 sats good on 0-2 liters , wears CPAP at night but has only been wearing O2 here. Fine during the day 3 Lytes/renal fxn  stable, sugars controlled 4 BS control ok, resume home meds at d/c 5 stable for d/c   LOS: 6 days    John Giovanni PA-C Pager [phone] 10/31/2020

## 2020-10-31 NOTE — TOC Transition Note (Signed)
Transition of Care [facility]) - CM/SW Discharge Note   Patient Details  Name: Rhealynn Myhre MRN: [id number] Date of Birth: September 10, 1956  [facility] CM/SW Contact:  Zenon Mayo, RN Phone Number: 10/31/2020, 10:05 AM   Clinical Narrative:    Patient is for dc today, she states she does not need a rolling walker, her neighbor has one that she will let her use.  She has no other needs.   Final next level of care: Home/Self Care Barriers to Discharge: No Barriers Identified   Patient Goals and CMS Choice Patient states their goals for this hospitalization and ongoing recovery are:: go home   Choice offered to / list presented to : NA  Discharge Placement                       Discharge Plan and Services                  DME Agency: NA       HH Arranged: NA          Social Determinants of Health (SDOH) Interventions     Readmission Risk Interventions No flowsheet data found.

## 2020-10-31 NOTE — Progress Notes (Signed)
Discussed sternal precautions, IS, diet, exercise, and CRPII. Pt receptive. She has lowered her A1C to 6.9 from 11 in 3 months. Reviewed diet but she has good understanding and doing well. Will refer to [place] CES, ACSM 10:02 AM 10/31/2020

## 2020-10-31 NOTE — Plan of Care (Signed)

## 2020-10-31 NOTE — Progress Notes (Signed)
Physical Therapy Treatment Patient Details Name: Sue Baker MRN: [id number] DOB: 02-Jun-1956 Today's Date: 10/31/2020    History of Present Illness 65 year old with history of diabetes and strong family history of coronary artery disease presents with a left heart cath showing significant stenosis. s/p 2/3 CABGx4.    PT Comments    Pt looking forward to discharge home today. Trialed ambulation without RW today, pt able to maintain ambulation with slight drifting in hallway, pt able to self steady. PT and pt in agreement that RW remains appropriate for safety especially in community situations. Pt also able to perform stair ascent/descent with light use of rail. D/c plans remain appropriate.     Follow Up Recommendations  No PT follow up;Supervision for mobility/OOB     Equipment Recommendations  Rolling walker with 5" wheels       Precautions / Restrictions Precautions Precautions: Fall;Sternal Precaution Booklet Issued: Yes (comment) Precaution Comments: wound vac on sternal incision Restrictions Weight Bearing Restrictions: Yes Other Position/Activity Restrictions: sternal precautions and "move in the tube" handout provided to Pt    Mobility  Bed Mobility Overal bed mobility: Modified Independent             General bed mobility comments: increased time and effort to come to EoB, no physical assist required  Transfers Overall transfer level: Needs assistance Equipment used: None Transfers: Sit to/from Stand Sit to Stand: Supervision         General transfer comment: supervision for safety, good power up and self steadying  Ambulation/Gait Ambulation/Gait assistance: Supervision Gait Distance (Feet): 450 Feet Assistive device: None Gait Pattern/deviations: Step-through pattern;WFL(Within Functional Limits);Drifts right/left Gait velocity: slowed Gait velocity interpretation: <1.8 ft/sec, indicate of risk for recurrent falls General Gait Details: supervision  for safety, slightly unsteady gait drifting R and L in hallway, pt reports this is her baseline   Stairs Stairs: Yes Stairs assistance: Supervision Stair Management: One rail Left;Forwards;Alternating pattern Number of Stairs: 4 General stair comments: good, step over step ascent and descent, light use of rail for balance         Balance Overall balance assessment: Mild deficits observed, not formally tested                                          Cognition Arousal/Alertness: Awake/alert Behavior During Therapy: WFL for tasks assessed/performed Overall Cognitive Status: Within Functional Limits for tasks assessed                                           General Comments General comments (skin integrity, edema, etc.): VSS on RA      Pertinent Vitals/Pain Pain Assessment: No/denies pain           PT Goals (current goals can now be found in the care plan section) Acute Rehab PT Goals Patient Stated Goal: get back to independent/work PT Goal Formulation: With patient Time For Goal Achievement: 11/13/20 Potential to Achieve Goals: Good Progress towards PT goals: Progressing toward goals    Frequency    Min 3X/week      PT Plan Current plan remains appropriate       AM-PAC PT "6 Clicks" Mobility   Outcome Measure  Help needed turning from your back to your side while in a flat bed without using  bedrails?: None Help needed moving from lying on your back to sitting on the side of a flat bed without using bedrails?: None Help needed moving to and from a bed to a chair (including a wheelchair)?: None Help needed standing up from a chair using your arms (e.g., wheelchair or bedside chair)?: None Help needed to walk in hospital room?: None Help needed climbing 3-5 steps with a railing? : Total 6 Click Score: 21    End of Session   Activity Tolerance: Patient tolerated treatment well Patient left: with call bell/phone within  reach;in bed;with nursing/sitter in room Nurse Communication: Mobility status PT Visit Diagnosis: Other abnormalities of gait and mobility (R26.89)     Time: 4718-5501 PT Time Calculation (min) (ACUTE ONLY): 15 min  Charges:  $Gait Training: 8-22 mins                     Dj Senteno B. Migdalia Dk PT, DPT Acute Rehabilitation Services Pager [phone] Office [phone]    [place] 10/31/2020, 10:16 AM

## 2020-11-01 MED FILL — Sodium Bicarbonate IV Soln 8.4%: INTRAVENOUS | Qty: 50 | Status: AC

## 2020-11-01 MED FILL — Electrolyte-R (PH 7.4) Solution: INTRAVENOUS | Qty: 5000 | Status: AC

## 2020-11-01 MED FILL — Lidocaine HCl Local Preservative Free (PF) Inj 2%: INTRAMUSCULAR | Qty: 15 | Status: AC

## 2020-11-01 MED FILL — Calcium Chloride Inj 10%: INTRAVENOUS | Qty: 10 | Status: AC

## 2020-11-01 MED FILL — Potassium Chloride Inj 2 mEq/ML: INTRAVENOUS | Qty: 40 | Status: AC

## 2020-11-01 MED FILL — Heparin Sodium (Porcine) Inj 1000 Unit/ML: INTRAMUSCULAR | Qty: 30 | Status: AC

## 2020-11-01 MED FILL — Heparin Sodium (Porcine) Inj 1000 Unit/ML: INTRAMUSCULAR | Qty: 20 | Status: AC

## 2020-11-01 MED FILL — Sodium Chloride IV Soln 0.9%: INTRAVENOUS | Qty: 3000 | Status: AC

## 2020-11-03 ENCOUNTER — Telehealth (HOSPITAL_COMMUNITY): Payer: Self-pay

## 2020-11-03 NOTE — Telephone Encounter (Signed)
Called pt to see if she is interested in the cardiac rehab program pt stated that she is not interested at this time, I advised her that if anything changes to give a call back. Pt understood. Closed referral.

## 2020-11-03 NOTE — Telephone Encounter (Signed)
Pt insurance is active and benefits verified through [place] $40, DED $600/$517 met, out of pocket $3,000/$557 met, co-insurance 0%. no pre-authorization required. Passport, 11/03/2020@11 :25am, REF# [id number]  Will contact patient to see if she is interested in the Cardiac Rehab Program. If interested, patient will need to complete follow up appt. Once completed, patient will be contacted for scheduling upon review by the RN Navigator.

## 2020-11-07 ENCOUNTER — Telehealth: Payer: Self-pay | Admitting: Cardiovascular Disease

## 2020-11-07 NOTE — Telephone Encounter (Signed)
[facility] received paperwork from [employer] on 2/14, paperwork was put in providers box on 2/15.

## 2020-11-07 NOTE — Telephone Encounter (Signed)
Patient was contacted and informed that her surgeon would be the one to complete her paperwork, she will have them faxed to surgeons office.

## 2020-11-10 ENCOUNTER — Ambulatory Visit (INDEPENDENT_AMBULATORY_CARE_PROVIDER_SITE_OTHER): Payer: Self-pay | Admitting: Thoracic Surgery (Cardiothoracic Vascular Surgery)

## 2020-11-10 ENCOUNTER — Encounter: Payer: Self-pay | Admitting: Thoracic Surgery (Cardiothoracic Vascular Surgery)

## 2020-11-10 ENCOUNTER — Other Ambulatory Visit: Payer: Self-pay

## 2020-11-10 VITALS — BP 117/70 | HR 85 | Temp 97.9°F | Resp 20 | Ht 64.0 in | Wt 189.0 lb

## 2020-11-10 DIAGNOSIS — Z951 Presence of aortocoronary bypass graft: Secondary | ICD-10-CM

## 2020-11-10 MED ORDER — TRAMADOL HCL 50 MG PO TABS: 50.0000 mg | ORAL_TABLET | Freq: Four times a day (QID) | ORAL | 0 refills | Status: AC | PRN

## 2020-11-10 NOTE — Progress Notes (Signed)
      [address]       [place] [address]             [phone]        Betsabe Deringer [place] Medical Record #[id number] Date of Birth: Feb 16, 1956  Referring: Troy Sine, MD Primary Care: Antony Contras, MD Primary Cardiologist:Thomas Claiborne Billings, MD  Reason for visit:   follow-up  History of Present Illness:     Ms. Ault comes in for 1 week follow-up appointment.  Overall she is doing quite well.  She continues to have some incisional pain that is most significant on the left side.  She describes as feeling sunburn.  Physical Exam: BP 117/70 (BP Location: Left Arm, Patient Position: Sitting)   Pulse 85   Temp 97.9 F (36.6 C)   Resp 20   Ht 5\' 4"  (1.626 m)   Wt 189 lb (85.7 kg)   SpO2 96% Comment: RA  BMI 32.44 kg/m   Alert NAD Incision clean.  Sternum stable Abdomen soft, ND No peripheral edema       Assessment / Plan:   65 year old female status post coronary artery bypass grafting currently doing well. We will follow up in month with a chest x-ray.   [name] 11/10/2020 3:03 PM

## 2020-11-13 NOTE — Progress Notes (Signed)
 Cardiology Clinic Note   Patient Name: Sue Baker Date of Encounter: 11/14/2020  Primary Care Provider:  Swayne, David, MD Primary Cardiologist:  Thomas Kelly, MD  Patient Profile    Sue Baker 65-year-old female presents the clinic today status post CABG x4 by Dr. Lightfoot on 10/26/2020.  Past Medical History    Past Medical History:  Diagnosis Date  . Allergy   . Arthritis   . Cataract   . Diabetes mellitus without complication (HCC)   . GERD (gastroesophageal reflux disease)   . Hyperlipidemia   . Hypertension   . Sleep apnea   . Thyroid disease    Past Surgical History:  Procedure Laterality Date  . BREAST SURGERY    . CARPAL TUNNEL RELEASE    . COLONOSCOPY  03/03/2020  . CORONARY ARTERY BYPASS GRAFT N/A 10/26/2020   Procedure: CORONARY ARTERY BYPASS GRAFTING (CABG), ON PUMP, TIMES FOUR, USING LEFT INTERNAL MAMMARY ARTERY AND ENDOSCOPICALLY HARVESTED RIGHT GREATER SAPHENOUS VEIN;  Surgeon: Lightfoot, Harrell O, MD;  Location: [facility] OR;  Service: Open Heart Surgery;  Laterality: N/A;  flow trac  . EYE SURGERY    . KNEE SURGERY     left  . LEFT HEART CATH AND CORONARY ANGIOGRAPHY N/A 10/25/2020   Procedure: LEFT HEART CATH AND CORONARY ANGIOGRAPHY;  Surgeon: Kelly, Thomas A, MD;  Location: [facility] INVASIVE CV LAB;  Service: Cardiovascular;  Laterality: N/A;  . TEE WITHOUT CARDIOVERSION N/A 10/26/2020   Procedure: TRANSESOPHAGEAL ECHOCARDIOGRAM (TEE);  Surgeon: Lightfoot, Harrell O, MD;  Location: [facility] OR;  Service: Open Heart Surgery;  Laterality: N/A;  . TUBAL LIGATION      Allergies  Allergies  Allergen Reactions  . Trulicity [Dulaglutide] Other (See Comments)    Changed vision, couldn't read    History of Present Illness  Sue Baker has a PMH of coronary artery disease, hypertension, OSA on CPAP, GERD, diabetes, hyperlipidemia, and medication noncompliance.  She underwent coronary CTA 09/18/2020 which showed significantly elevated calcium score at 1931 which placed  her in the 90th percentile for age and gender suggesting high risk for future cardiac events.  It was felt that she had a possibly 50% distal left main stenosis.  Heavy calcification was noted in the proximal and mid LAD as well as severe stenosis and extensive calcification which made it difficult to quantify.  She underwent FFR which showed significant 0.  7 3 distal LAD lesion, left main did not appear hemodynamically significant, ramus FFR could not be embolized.  She admitted to exertional chest tightness as well as dyspnea.  She continued to work in her garden.  She denied angina at rest.  Cardiac catheterization was recommended.  She presented for elective left heart cath 10/25/2020 and was found to have two-vessel coronary artery disease.  Over the last several months she had noticed exertional dyspnea and increasing fatigue.  She also reported occasional chest pressure.  Cardiothoracic surgery was consulted and she underwent CABG x4 on 10/26/2020 (LIMA-LAD, SVG-OM 3, SVG ramus intermedius, and SVG-first diagonal).  She responded well to postop IV diuresis and progressed well.  She was discharged in stable condition on 10/31/2020.  She presents to the clinic today for follow-up evaluation states she feels well.  She is currently walking around 1 hour/day.  She reports that she splits the 1 hour into segments.  We reviewed her angiography, CTA, and areas where her bypass grafts were placed.  She reports that she has not been avoiding salt in her diet.  I will   give her the salty 6 diet sheet, have her maintain her physical activity, repeat a lipid panel, and have her follow-up with Dr. Claiborne Billings in 3 months.  Today she denies chest pain, shortness of breath, lower extremity edema, fatigue, palpitations, melena, hematuria, hemoptysis, diaphoresis, weakness, presyncope, syncope, orthopnea, and PND.   Home Medications    Prior to Admission medications   Medication Sig Start Date End Date Taking? Authorizing  Provider  aspirin EC 325 MG EC tablet Take 1 tablet (325 mg total) by mouth daily. 10/31/20   Gold, Patrick Jupiter E, PA-C  blood glucose meter kit and supplies KIT Dispense based on patient and insurance preference. Use up to four times daily as directed. (FOR ICD-10 E11.9) 12/21/19   Forrest Moron, MD  BYDUREON BCISE 2 MG/0.85ML AUIJ Inject 2 mg into the skin every Friday. 08/10/20   [provider]  Continuous Blood Gluc Sensor (FREESTYLE LIBRE 2 SENSOR) MISC  07/19/20   [provider]  escitalopram (LEXAPRO) 10 MG tablet TAKE 1 TABLET BY MOUTH DAILY Patient taking differently: Take 10 mg by mouth daily. 09/22/19   Forrest Moron, MD  esomeprazole (NEXIUM 24HR) 20 MG capsule Take 1 capsule (20 mg total) by mouth daily at 12 noon. 01/14/20   Willia Craze, NP  fenofibrate (TRICOR) 145 MG tablet Take 1 tablet (145 mg total) by mouth daily. 11/19/19   Forrest Moron, MD  insulin glargine (LANTUS SOLOSTAR) 100 UNIT/ML Solostar Pen Inject 15 Units into the skin at bedtime. 08/09/20   [provider]  JARDIANCE 25 MG TABS tablet Take 25 mg by mouth daily. 08/30/20   [provider]  levothyroxine (SYNTHROID) 150 MCG tablet Take 1 tablet (150 mcg total) by mouth daily before breakfast. TAKE 1 TABLET BY MOUTH DAILY BEFORE BREAKFAST 10/31/20   Gold, Wayne E, PA-C  metFORMIN (GLUCOPHAGE-XR) 500 MG 24 hr tablet Take 1,000 mg by mouth 2 (two) times daily. 07/03/20   [provider]  metoprolol succinate (TOPROL XL) 25 MG 24 hr tablet Take 0.5 tablets (12.5 mg total) by mouth 2 (two) times daily. 10/31/20   John Giovanni, PA-C  Multiple Vitamins-Minerals (WOMENS MULTI VITAMIN & MINERAL) TABS Take 1 tablet by mouth daily.    [provider]  [facility] VERIO test strip USE AS DIRECTED 01/12/20   Forrest Moron, MD  Propylene Glycol (SYSTANE COMPLETE) 0.6 % SOLN Place 1 drop into both eyes 4 (four) times daily as needed (dry eyes).    [provider]   rosuvastatin (CRESTOR) 40 MG tablet Take 1 tablet (40 mg total) by mouth daily. 11/19/19   Forrest Moron, MD  traMADol (ULTRAM) 50 MG tablet Take 1 tablet (50 mg total) by mouth every 6 (six) hours as needed for moderate pain. 11/10/20   [name], MD    Family History    Family History  Problem Relation Age of Onset  . Healthy Mother   . Heart disease Father   . Peripheral Artery Disease Father   . Hypertension Father   . Thyroid disease Sister   . Cancer Sister   . Hypertension Maternal Grandfather   . Heart disease Maternal Grandfather   . Diabetes Maternal Grandfather   . Colon cancer Neg Hx   . Colon polyps Neg Hx   . Esophageal cancer Neg Hx   . Rectal cancer Neg Hx   . Stomach cancer Neg Hx    She indicated that her mother is alive. She indicated  that her father is alive. She indicated that her sister is alive. She indicated that her maternal grandmother is deceased. She indicated that her maternal grandfather is deceased. She indicated that her paternal grandmother is deceased. She indicated that her paternal grandfather is deceased. She indicated that her daughter is alive. She indicated that her son is alive. She indicated that the status of her neg hx is unknown.  Social History    Social History   Socioeconomic History  . Marital status: Divorced    Spouse name: Not on file  . Number of children: Not on file  . Years of education: 14  . Highest education level: Some college, no degree  Occupational History    Employer: [employer]  Tobacco Use  . Smoking status: Former Smoker    Quit date: 09/30/1999    Years since quitting: 21.1  . Smokeless tobacco: Never Used  Vaping Use  . Vaping Use: Never used  Substance and Sexual Activity  . Alcohol use: Yes    Comment: rarely  . Drug use: No  . Sexual activity: Not Currently  Other Topics Concern  . Not on file  Social History Narrative   Lives in a one story home.  Her daughter and grandson  live with her.  Works at [employer]. 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  Education: some college.    Social Determinants of Health   Financial Resource Strain: Not on file  Food Insecurity: Not on file  Transportation Needs: Not on file  Physical Activity: Not on file  Stress: Not on file  Social Connections: Not on file  Intimate Partner Violence: Not on file     Review of Systems    General:  No chills, fever, night sweats or weight changes.  Cardiovascular:  No chest pain, dyspnea on exertion, edema, orthopnea, palpitations, paroxysmal nocturnal dyspnea. Dermatological: No rash, lesions/masses Respiratory: No cough, dyspnea Urologic: No hematuria, dysuria Abdominal:   No nausea, vomiting, diarrhea, bright red blood per rectum, melena, or hematemesis Neurologic:  No visual changes, wkns, changes in mental status. All other systems reviewed and are otherwise negative except as noted above.  Physical Exam    VS:  BP 126/62   Pulse 68   Ht 5' 4" (1.626 m)   Wt 189 lb 12.8 oz (86.1 kg)   BMI 32.58 kg/m  , BMI Body mass index is 32.58 kg/m. GEN: Well nourished, well developed, in no acute distress. HEENT: normal. Neck: Supple, no JVD, carotid bruits, or masses. Cardiac: RRR, no murmurs, rubs, or gallops. No clubbing, cyanosis, edema.  Radials/DP/PT 2+ and equal bilaterally.  Respiratory:  Respirations regular and unlabored, clear to auscultation bilaterally. GI: Soft, nontender, nondistended, BS + x 4. MS: no deformity or atrophy. Skin: warm and dry, no rash. Neuro:  Strength and sensation are intact. Psych: Normal affect.  Accessory Clinical Findings    Recent Labs: 11/19/2019: TSH 0.021 10/26/2020: ALT 22 10/27/2020: Magnesium 1.9 10/29/2020: Hemoglobin 9.1; Platelets 155 10/31/2020: BUN 14; Creatinine, Ser 0.63; Potassium 3.8; Sodium 138   Recent Lipid Panel    Component Value Date/Time   CHOL 149 11/19/2019 1145   TRIG 246 (H) 11/19/2019 1145   HDL  35 (L) 11/19/2019 1145   CHOLHDL 4.3 11/19/2019 1145   CHOLHDL 5.2 (H) 08/14/2016 0921   VLDL 27 08/14/2016 0921   LDLCALC 74 11/19/2019 1145   LDLDIRECT 153 (H) 11/17/2017 1541    ECG personally reviewed by me today-sinus rhythm no   ST or T wave deviation 68 bpm- No acute changes  Cardiac catheterization 11/14/2020 Diagnostic Dominance: Right    Intervention    Assessment & Plan   1.  Status post CABG x4-no chest pain today.  Underwent cardiac catheterization 10/26/2020. Slowly progressing physical activity Continue aspirin, metoprolol, rosuvastatin Heart healthy low-sodium diet-salty 6 given Increase physical activity follow sternal precautions  Essential hypertension-BP today 126/62 . Well-controlled at home Continue metoprolol Heart healthy low-sodium diet-salty 6 given Increase physical activity as tolerated  Hyperlipidemia-11/19/2019: Cholesterol, Total 149; HDL 35; LDL Chol Calc (NIH) 74; Triglycerides 246 Continue rosuvastatin, fenofibrate Heart healthy low-sodium diet-salty 6 given Increase physical activity as tolerated  Hypothyroidism-TSH 0.021on 11/19/19 Continue levothyroxine Follows with PCP  Diabetes mellitus-glucose 124 on 10/31/2020. Continue Metformin, Aurelio Jew with PCP  Disposition: Follow-up with Dr. Claiborne Billings in 3 months.  Jossie Ng. Jahnessa Vanduyn NP-C    11/14/2020, 10:56 AM [place] [address] Office [phone] Fax [phone]  Notice: This dictation was prepared with Dragon dictation along with smaller phrase technology. Any transcriptional errors that result from this process are unintentional and may not be corrected upon review.  I spent 12 minutes examining this patient, reviewing medications, and using patient centered shared decision making involving her cardiac care.  Prior to her visit I spent greater than 20 minutes reviewing her past medical history,  medications, and prior cardiac  tests.

## 2020-11-14 ENCOUNTER — Telehealth: Payer: Self-pay

## 2020-11-14 ENCOUNTER — Other Ambulatory Visit: Payer: Self-pay

## 2020-11-14 ENCOUNTER — Ambulatory Visit (INDEPENDENT_AMBULATORY_CARE_PROVIDER_SITE_OTHER): Payer: 59 | Admitting: General Practice

## 2020-11-14 ENCOUNTER — Encounter: Payer: Self-pay | Admitting: General Practice

## 2020-11-14 VITALS — BP 126/62 | HR 68 | Ht 64.0 in | Wt 189.8 lb

## 2020-11-14 DIAGNOSIS — Z951 Presence of aortocoronary bypass graft: Secondary | ICD-10-CM

## 2020-11-14 DIAGNOSIS — E785 Hyperlipidemia, unspecified: Secondary | ICD-10-CM

## 2020-11-14 DIAGNOSIS — I10 Essential (primary) hypertension: Secondary | ICD-10-CM | POA: Diagnosis not present

## 2020-11-14 DIAGNOSIS — E039 Hypothyroidism, unspecified: Secondary | ICD-10-CM | POA: Diagnosis not present

## 2020-11-14 DIAGNOSIS — Z79899 Other long term (current) drug therapy: Secondary | ICD-10-CM

## 2020-11-14 DIAGNOSIS — E118 Type 2 diabetes mellitus with unspecified complications: Secondary | ICD-10-CM

## 2020-11-14 NOTE — Patient Instructions (Signed)
Medication Instructions:  The current medical regimen is effective;  continue present plan and medications as directed. Please refer to the Current Medication list given to you today.  *If you need a refill on your cardiac medications before your next appointment, please call your pharmacy*  Lab Work: LIPID PANEL TODAY If you have labs (blood work) drawn today and your tests are completely normal, you will receive your results only by:  [place] (if you have MyChart) OR A paper copy in the mail.  If you have any lab test that is abnormal or we need to change your treatment, we will call you to review the results. You may go to any [facility] that is convenient for you however, we do have a lab in our office that is able to assist you. You DO NOT need an appointment for our lab. The lab is open 8:00am and closes at 4:00pm. Lunch 12:45 - 1:45pm.  Special Instructions PLEASE READ AND FOLLOW SALTY 6-ATTACHED-1,800 mg daily  PLEASE INCREASE PHYSICAL ACTIVITY AS TOLERATED  Follow-Up: Your next appointment:  3 month(s) In Person with Shelva Majestic, MD ONLY  At [facility], you and your health needs are our priority.  As part of our continuing mission to provide you with exceptional heart care, we have created designated Provider Care Teams.  These Care Teams include your primary Cardiologist (physician) and Advanced Practice Providers (APPs -  Physician Assistants and Nurse Practitioners) who all work together to provide you with the care you need, when you need it.

## 2020-11-14 NOTE — Telephone Encounter (Signed)
STD form from [place] completed and faxed to [phone],  Beginning leave 10/25/20,approx.  RTW date 01/22/21.

## 2020-11-15 LAB — LIPID PANEL
Chol/HDL Ratio: 3.6 ratio (ref 0.0–4.4)
Cholesterol, Total: 123 mg/dL (ref 100–199)
HDL: 34 mg/dL — ABNORMAL LOW (ref >39)
LDL Chol Calc (NIH): 63 mg/dL (ref 0–99)
Triglycerides: 153 mg/dL — ABNORMAL HIGH (ref 0–149)
VLDL Cholesterol Cal: 26 mg/dL (ref 5–40)

## 2020-11-16 ENCOUNTER — Other Ambulatory Visit: Payer: Self-pay

## 2020-11-16 DIAGNOSIS — E785 Hyperlipidemia, unspecified: Secondary | ICD-10-CM

## 2020-11-16 DIAGNOSIS — Z79899 Other long term (current) drug therapy: Secondary | ICD-10-CM

## 2020-11-16 MED ORDER — FISH OIL 1000 MG PO CAPS: 2000.0000 mg | ORAL_CAPSULE | Freq: Two times a day (BID) | ORAL | 0 refills | Status: AC

## 2020-12-07 ENCOUNTER — Other Ambulatory Visit: Payer: Self-pay | Admitting: Cardiothoracic Surgery

## 2020-12-07 DIAGNOSIS — Z951 Presence of aortocoronary bypass graft: Secondary | ICD-10-CM

## 2020-12-08 ENCOUNTER — Ambulatory Visit (INDEPENDENT_AMBULATORY_CARE_PROVIDER_SITE_OTHER): Payer: Self-pay | Admitting: Thoracic Surgery (Cardiothoracic Vascular Surgery)

## 2020-12-08 ENCOUNTER — Other Ambulatory Visit: Payer: Self-pay

## 2020-12-08 ENCOUNTER — Ambulatory Visit
Admission: RE | Admit: 2020-12-08 | Discharge: 2020-12-08 | Disposition: A | Discharge status: Home / Self Care | Payer: 59 | Source: Ambulatory Visit | Attending: Thoracic Surgery (Cardiothoracic Vascular Surgery) | Admitting: Thoracic Surgery (Cardiothoracic Vascular Surgery)

## 2020-12-08 ENCOUNTER — Encounter: Payer: Self-pay | Admitting: Thoracic Surgery (Cardiothoracic Vascular Surgery)

## 2020-12-08 VITALS — BP 128/78 | HR 84 | Resp 20 | Ht 64.0 in | Wt 189.0 lb

## 2020-12-08 DIAGNOSIS — Z951 Presence of aortocoronary bypass graft: Secondary | ICD-10-CM

## 2020-12-08 NOTE — Progress Notes (Signed)
      [address]       [place] [address]             [phone]        Sue Baker [place] Medical Record #[id number] Date of Birth: 19-Jan-1956  Referring: Troy Sine, MD Primary Care: Antony Contras, MD Primary Cardiologist:Thomas Claiborne Billings, MD  Reason for visit:   follow-up  History of Present Illness:     Sue Baker presents today for her 1 month follow-up appointment.  Overall she is doing well.  She is able to walk without any shortness of breath.  She continues to have a small knots at her venous access site, but this has been improving.  She does have some tenderness in her chest when she lays on her side.  Physical Exam: BP 128/78 (BP Location: Right Arm, Patient Position: Sitting)   Pulse 84   Resp 20   Ht 5\' 4"  (1.626 m)   Wt 189 lb (85.7 kg)   SpO2 98% Comment: RA  BMI 32.44 kg/m   Alert NAD Incision clean, well-healed.  Sternum stable Abdomen soft, ND No peripheral edema   Diagnostic Studies & Laboratory data: CXR: Small left pleural effusion     Assessment / Plan:   65 year old female status post CABG.  Currently doing well.  She has refused cardiac rehab but I encouraged her to at least get the stretching and massage techniques so she will not have any continued chest wall tenderness.  Cleared to return to work and to drive. Follow-up as needed.   [name] 12/08/2020 12:55 PM

## 2020-12-28 ENCOUNTER — Encounter: Payer: Self-pay | Admitting: *Deleted

## 2021-01-19 ENCOUNTER — Other Ambulatory Visit: Payer: 59

## 2021-02-05 ENCOUNTER — Telehealth: Payer: Self-pay

## 2021-02-05 DIAGNOSIS — Z006 Encounter for examination for normal comparison and control in clinical research program: Secondary | ICD-10-CM

## 2021-02-05 NOTE — Telephone Encounter (Signed)
I called patient for her 90-day Identify Study follow up phone call. Patient is doing well with no cardiac symptoms at this time. Patient did have a diagnostic heart cath 10/26/2020 due to her abnormal CT. She had a CABG 10/26/2020. Patient did have some shortness of breath with activity prior to these procedures. Fraser Din states she is doing a lot better since her CABG. The SOB has resolved. I reminded patient I would call her in Dec. for her 1 year follow-up.

## 2021-02-19 NOTE — Progress Notes (Signed)
Cardiology Clinic Note   Patient Name: [name] Date of Encounter: 02/20/2021  Primary Care Provider:  Antony Contras, MD Primary Cardiologist:  Shelva Majestic, MD  Patient Profile    Sue Baker 65 year old female presents the clinic today for follow-up of her CAD status post CABG x4 by Dr. Kipp Brood on 10/26/2020.  Past Medical History    Past Medical History:  Diagnosis Date  . Allergy   . Arthritis   . Cataract   . Diabetes mellitus without complication ([place])   . GERD (gastroesophageal reflux disease)   . Hyperlipidemia   . Hypertension   . Sleep apnea   . Thyroid disease    Past Surgical History:  Procedure Laterality Date  . BREAST SURGERY    . CARPAL TUNNEL RELEASE    . COLONOSCOPY  03/03/2020  . CORONARY ARTERY BYPASS GRAFT N/A 10/26/2020   Procedure: CORONARY ARTERY BYPASS GRAFTING (CABG), ON PUMP, TIMES FOUR, USING LEFT INTERNAL MAMMARY ARTERY AND ENDOSCOPICALLY HARVESTED RIGHT GREATER SAPHENOUS VEIN;  Surgeon: [name], MD;  Location: [place];  Service: Open Heart Surgery;  Laterality: N/A;  flow trac  . EYE SURGERY    . KNEE SURGERY     left  . LEFT HEART CATH AND CORONARY ANGIOGRAPHY N/A 10/25/2020   Procedure: LEFT HEART CATH AND CORONARY ANGIOGRAPHY;  Surgeon: Troy Sine, MD;  Location: [place] [facility];  Service: Cardiovascular;  Laterality: N/A;  . TEE WITHOUT CARDIOVERSION N/A 10/26/2020   Procedure: TRANSESOPHAGEAL ECHOCARDIOGRAM (TEE);  Surgeon: [name], MD;  Location: [place];  Service: Open Heart Surgery;  Laterality: N/A;  . TUBAL LIGATION      Allergies  Allergies  Allergen Reactions  . Trulicity [Dulaglutide] Other (See Comments)    Changed vision, couldn't read    History of Present Illness    [name] has a PMH of coronary artery disease, hypertension, OSA on CPAP, GERD, diabetes, hyperlipidemia, and medication noncompliance.  She underwent coronary CTA 09/18/2020 which showed significantly elevated  calcium score at 1931 which placed her in the 90th percentile for age and gender suggesting high risk for future cardiac events.  It was felt that she had a possibly 50% distal left main stenosis.  Heavy calcification was noted in the proximal and mid LAD as well as severe stenosis and extensive calcification which made it difficult to quantify.  She underwent FFR which showed significant 0.  7 3 distal LAD lesion, left main did not appear hemodynamically significant, ramus FFR could not be embolized.  She admitted to exertional chest tightness as well as dyspnea.  She continued to work in her garden.  She denied angina at rest.  Cardiac catheterization was recommended.  She presented for elective left heart cath 10/25/2020 and was found to have two-vessel coronary artery disease.  Over the last several months she had noticed exertional dyspnea and increasing fatigue.  She also reported occasional chest pressure.  Cardiothoracic surgery was consulted and she underwent CABG x4 on 10/26/2020 (LIMA-LAD, SVG-OM 3, SVG ramus intermedius, and SVG-first diagonal).  She responded well to postop IV diuresis and progressed well.  She was discharged in stable condition on 10/31/2020.  She presented to the clinic 11/14/2020 for follow-up evaluation stated she felt well.  She was  walking around 1 hour/day.  She reported that she split the 1 hour into segments.  We reviewed her angiography, CTA, and areas where her bypass grafts were placed.  She reported that she had not been avoiding  salt in her diet.  I  gave her the salty 6 diet sheet, had her maintain her physical activity, repeated a lipid panel, and planned her follow-up with Dr. Claiborne Billings in 3 months.  She presents to the clinic today for follow-up evaluation states she is back to all of her normal activities.  She is back to working full-time.  She is in the process of building a deck around her pool.  She is doing to work on her own with her daughter.  She reports she  was also gardening.  She has been trying to stay away from high salt foods and follow a heart healthy diet.  She denies episodes of palpitations and syncope.  She reports she has been spending more time outside with the nicer weather.  She plans to retire next 1-2 years.  [name] the salty 6 diet sheet, have her maintain her physical activity, continue her current medications, and have her follow-up with Dr. Claiborne Billings in 6 months.    Today she denies chest pain, shortness of breath, lower extremity edema, fatigue, palpitations, melena, hematuria, hemoptysis, diaphoresis, weakness, presyncope, syncope, orthopnea, and PND.   Home Medications    Prior to Admission medications   Medication Sig Start Date End Date Taking? Authorizing Provider  aspirin EC 325 MG EC tablet Take 1 tablet (325 mg total) by mouth daily. 10/31/20   Gold, Patrick Jupiter E, PA-C  blood glucose meter kit and supplies KIT Dispense based on patient and insurance preference. Use up to four times daily as directed. (FOR ICD-10 E11.9) 12/21/19   Forrest Moron, MD  Continuous Blood Gluc Sensor (FREESTYLE LIBRE 2 SENSOR) MISC  07/19/20   [provider]  escitalopram (LEXAPRO) 10 MG tablet TAKE 1 TABLET BY MOUTH DAILY Patient taking differently: Take 10 mg by mouth daily. 09/22/19   Forrest Moron, MD  esomeprazole (NEXIUM 24HR) 20 MG capsule Take 1 capsule (20 mg total) by mouth daily at 12 noon. 01/14/20   Willia Craze, NP  fenofibrate (TRICOR) 145 MG tablet Take 1 tablet (145 mg total) by mouth daily. 11/19/19   Forrest Moron, MD  insulin glargine (LANTUS SOLOSTAR) 100 UNIT/ML Solostar Pen Inject 15 Units into the skin at bedtime. 08/09/20   [provider]  JARDIANCE 25 MG TABS tablet Take 25 mg by mouth daily. 08/30/20   [provider]  levothyroxine (SYNTHROID) 150 MCG tablet Take 1 tablet (150 mcg total) by mouth daily before breakfast. TAKE 1 TABLET BY MOUTH DAILY BEFORE BREAKFAST 10/31/20   Gold,  Wayne E, PA-C  metFORMIN (GLUCOPHAGE-XR) 500 MG 24 hr tablet Take 1,000 mg by mouth 2 (two) times daily. 07/03/20   [provider]  metoprolol succinate (TOPROL XL) 25 MG 24 hr tablet Take 0.5 tablets (12.5 mg total) by mouth 2 (two) times daily. 10/31/20   John Giovanni, PA-C  Multiple Vitamins-Minerals (WOMENS MULTI VITAMIN & MINERAL) TABS Take 1 tablet by mouth daily.    [provider]  Omega-3 Fatty Acids (FISH OIL) 1000 MG CAPS Take 2 capsules (2,000 mg total) by mouth 2 (two) times daily. 11/16/20   Deberah Pelton, NP  ONETOUCH VERIO test strip USE AS DIRECTED 01/12/20   Forrest Moron, MD  Propylene Glycol (SYSTANE COMPLETE) 0.6 % SOLN Place 1 drop into both eyes 4 (four) times daily as needed (dry eyes).    [provider]  rosuvastatin (CRESTOR) 40 MG tablet Take 1 tablet (40 mg total) by  mouth daily. 11/19/19   Forrest Moron, MD  traMADol (ULTRAM) 50 MG tablet Take 1 tablet (50 mg total) by mouth every 6 (six) hours as needed for moderate pain. 11/10/20   [name], MD    Family History    Family History  Problem Relation Age of Onset  . Healthy Mother   . Heart disease Father   . Peripheral Artery Disease Father   . Hypertension Father   . Thyroid disease Sister   . Cancer Sister   . Hypertension Maternal Grandfather   . Heart disease Maternal Grandfather   . Diabetes Maternal Grandfather   . Colon cancer Neg Hx   . Colon polyps Neg Hx   . Esophageal cancer Neg Hx   . Rectal cancer Neg Hx   . Stomach cancer Neg Hx    She indicated that her mother is alive. She indicated that her father is alive. She indicated that her sister is alive. She indicated that her maternal grandmother is deceased. She indicated that her maternal grandfather is deceased. She indicated that her paternal grandmother is deceased. She indicated that her paternal grandfather is deceased. She indicated that her daughter is alive. She indicated that her son is alive.  She indicated that the status of her neg hx is unknown.  Social History    Social History   Socioeconomic History  . Marital status: Divorced    Spouse name: Not on file  . Number of children: Not on file  . Years of education: 74  . Highest education level: Some college, no degree  Occupational History    Employer: [employer]  Tobacco Use  . Smoking status: Former Smoker    Quit date: 09/30/1999    Years since quitting: 21.4  . Smokeless tobacco: Never Used  Vaping Use  . Vaping Use: Never used  Substance and Sexual Activity  . Alcohol use: Yes    Comment: rarely  . Drug use: No  . Sexual activity: Not Currently  Other Topics Concern  . Not on file  Social History Narrative   Lives in a one story home.  Her daughter and grandson live with her.  Works at [employer]. 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  Education: some college.    Social Determinants of Health   Financial Resource Strain: Not on file  Food Insecurity: Not on file  Transportation Needs: Not on file  Physical Activity: Not on file  Stress: Not on file  Social Connections: Not on file  Intimate Partner Violence: Not on file     Review of Systems    General:  No chills, fever, night sweats or weight changes.  Cardiovascular:  No chest pain, dyspnea on exertion, edema, orthopnea, palpitations, paroxysmal nocturnal dyspnea. Dermatological: No rash, lesions/masses Respiratory: No cough, dyspnea Urologic: No hematuria, dysuria Abdominal:   No nausea, vomiting, diarrhea, bright red blood per rectum, melena, or hematemesis Neurologic:  No visual changes, wkns, changes in mental status. All other systems reviewed and are otherwise negative except as noted above.  Physical Exam    VS:  BP 120/62   Pulse 60   Ht 5' 4"  (1.626 m)   Wt 181 lb 9.6 oz (82.4 kg)   SpO2 97%   BMI 31.17 kg/m  , BMI Body mass index is 31.17 kg/m. GEN: Well nourished, well developed, in no acute  distress. HEENT: normal. Neck: Supple, no JVD, carotid bruits, or masses. Cardiac: RRR, no murmurs, rubs, or  gallops. No clubbing, cyanosis, edema.  Radials/DP/PT 2+ and equal bilaterally.  Respiratory:  Respirations regular and unlabored, clear to auscultation bilaterally. GI: Soft, nontender, nondistended, BS + x 4. MS: no deformity or atrophy. Skin: warm and dry, no rash. Neuro:  Strength and sensation are intact. Psych: Normal affect.  Accessory Clinical Findings    Recent Labs: 10/26/2020: ALT 22 10/27/2020: Magnesium 1.9 10/29/2020: Hemoglobin 9.1; Platelets 155 10/31/2020: BUN 14; Creatinine, Ser 0.63; Potassium 3.8; Sodium 138   Recent Lipid Panel    Component Value Date/Time   CHOL 123 11/14/2020 1101   TRIG 153 (H) 11/14/2020 1101   HDL 34 (L) 11/14/2020 1101   CHOLHDL 3.6 11/14/2020 1101   CHOLHDL 5.2 (H) 08/14/2016 0921   VLDL 27 08/14/2016 0921   LDLCALC 63 11/14/2020 1101   LDLDIRECT 153 (H) 11/17/2017 1541    ECG personally reviewed by me today-none today.  EKG 11/14/2020 sinus rhythm no ST or T wave deviation 68 bpm- No acute changes  Cardiac catheterization 11/14/2020 Diagnostic Dominance: Right     Assessment & Plan   1.  Coronary artery disease- denies chest pain.  Status post CABG x4 by Dr. Kipp Brood on 10/26/2020.  Continues to increase her physical activity. Slowly progressing physical activity Continue aspirin, metoprolol, rosuvastatin Heart healthy low-sodium diet-salty 6 given Maintain physical activity  Essential hypertension-BP today 120/62 . Well-controlled at home Continue metoprolol Heart healthy low-sodium diet-salty 6 given Increase physical activity as tolerated  Hyperlipidemia-11/14/2020: Cholesterol, Total 123; HDL 34; LDL Chol Calc (NIH) 63; Triglycerides 153 Continue rosuvastatin, fenofibrate Heart healthy low-sodium high-fiber diet Increase physical activity as tolerated  Hypothyroidism-TSH 0.021on 11/19/19 Continue  levothyroxine Follows with PCP  Diabetes mellitus-glucose 124 on 10/31/2020. Continue Metformin, Aurelio Jew with PCP  Disposition: Follow-up with Dr. Claiborne Billings in 6 months.  Jossie Ng. Lynnann Knudsen NP-C    02/20/2021, 3:24 PM [facility] [place] [address] Office [phone] Fax [phone]  Notice: This dictation was prepared with Dragon dictation along with smaller phrase technology. Any transcriptional errors that result from this process are unintentional and may not be corrected upon review.  I spent 14 minutes examining this patient, reviewing medications, and using patient centered shared decision making involving her cardiac care.  Prior to her visit I spent greater than 20 minutes reviewing her past medical history,  medications, and prior cardiac tests.

## 2021-02-20 ENCOUNTER — Ambulatory Visit (INDEPENDENT_AMBULATORY_CARE_PROVIDER_SITE_OTHER): Payer: 59 | Admitting: General Practice

## 2021-02-20 ENCOUNTER — Other Ambulatory Visit: Payer: Self-pay

## 2021-02-20 ENCOUNTER — Encounter: Payer: Self-pay | Admitting: General Practice

## 2021-02-20 VITALS — BP 120/62 | HR 60 | Ht 64.0 in | Wt 181.6 lb

## 2021-02-20 DIAGNOSIS — E039 Hypothyroidism, unspecified: Secondary | ICD-10-CM | POA: Diagnosis not present

## 2021-02-20 DIAGNOSIS — I10 Essential (primary) hypertension: Secondary | ICD-10-CM

## 2021-02-20 DIAGNOSIS — E785 Hyperlipidemia, unspecified: Secondary | ICD-10-CM | POA: Diagnosis not present

## 2021-02-20 DIAGNOSIS — Z951 Presence of aortocoronary bypass graft: Secondary | ICD-10-CM

## 2021-02-20 DIAGNOSIS — E118 Type 2 diabetes mellitus with unspecified complications: Secondary | ICD-10-CM

## 2021-02-20 NOTE — Patient Instructions (Signed)
Medication Instructions:  The current medical regimen is effective;  continue present plan and medications as directed. Please refer to the Current Medication list given to you today.  *If you need a refill on your cardiac medications before your next appointment, please call your pharmacy*  Lab Work:   Testing/Procedures:  NONE    NONE  Special Instructions PLEASE READ AND FOLLOW SALTY 6-ATTACHED-1,800mg  daily  PLEASE MAINTAIN PHYSICAL ACTIVITY AS TOLERATED  Follow-Up: Your next appointment:  6 month(s) In Person with Sue Majestic, Sue Baker OR IF UNAVAILABLE Sue CLEAVER, Sue Baker   Please call our office 2 months in advance to schedule this appointment   At [facility], you and your health needs are our priority.  As part of our continuing mission to provide you with exceptional heart care, we have created designated Provider Care Teams.  These Care Teams include your primary Cardiologist (physician) and Advanced Practice Providers (APPs -  Physician Assistants and Nurse Practitioners) who all work together to provide you with the care you need, when you need it.            6 SALTY THINGS TO AVOID     1,800MG  DAILY

## 2022-10-14 DIAGNOSIS — I2510 Atherosclerotic heart disease of native coronary artery without angina pectoris: Secondary | ICD-10-CM | POA: Diagnosis not present

## 2022-10-14 DIAGNOSIS — I10 Essential (primary) hypertension: Secondary | ICD-10-CM | POA: Diagnosis not present

## 2022-10-14 DIAGNOSIS — E1165 Type 2 diabetes mellitus with hyperglycemia: Secondary | ICD-10-CM | POA: Diagnosis not present

## 2022-10-14 DIAGNOSIS — E785 Hyperlipidemia, unspecified: Secondary | ICD-10-CM | POA: Diagnosis not present

## 2022-10-14 DIAGNOSIS — E039 Hypothyroidism, unspecified: Secondary | ICD-10-CM | POA: Diagnosis not present

## 2022-10-17 DIAGNOSIS — B351 Tinea unguium: Secondary | ICD-10-CM | POA: Diagnosis not present

## 2022-10-17 DIAGNOSIS — E119 Type 2 diabetes mellitus without complications: Secondary | ICD-10-CM | POA: Diagnosis not present

## 2022-12-24 DIAGNOSIS — M1711 Unilateral primary osteoarthritis, right knee: Secondary | ICD-10-CM | POA: Diagnosis not present

## 2022-12-26 DIAGNOSIS — E785 Hyperlipidemia, unspecified: Secondary | ICD-10-CM | POA: Diagnosis not present

## 2022-12-26 DIAGNOSIS — E1165 Type 2 diabetes mellitus with hyperglycemia: Secondary | ICD-10-CM | POA: Diagnosis not present

## 2022-12-26 DIAGNOSIS — I10 Essential (primary) hypertension: Secondary | ICD-10-CM | POA: Diagnosis not present

## 2022-12-26 DIAGNOSIS — E039 Hypothyroidism, unspecified: Secondary | ICD-10-CM | POA: Diagnosis not present

## 2023-03-26 DIAGNOSIS — E039 Hypothyroidism, unspecified: Secondary | ICD-10-CM | POA: Diagnosis not present

## 2023-03-26 DIAGNOSIS — E785 Hyperlipidemia, unspecified: Secondary | ICD-10-CM | POA: Diagnosis not present

## 2023-03-26 DIAGNOSIS — E1165 Type 2 diabetes mellitus with hyperglycemia: Secondary | ICD-10-CM | POA: Diagnosis not present

## 2023-03-28 ENCOUNTER — Encounter: Payer: Self-pay | Admitting: Gastroenterology

## 2023-04-18 DIAGNOSIS — M25511 Pain in right shoulder: Secondary | ICD-10-CM | POA: Diagnosis not present

## 2023-04-26 DIAGNOSIS — M25511 Pain in right shoulder: Secondary | ICD-10-CM | POA: Diagnosis not present

## 2023-05-07 DIAGNOSIS — M25811 Other specified joint disorders, right shoulder: Secondary | ICD-10-CM | POA: Diagnosis not present

## 2023-05-29 DIAGNOSIS — I10 Essential (primary) hypertension: Secondary | ICD-10-CM | POA: Diagnosis not present

## 2023-05-29 DIAGNOSIS — E785 Hyperlipidemia, unspecified: Secondary | ICD-10-CM | POA: Diagnosis not present

## 2023-05-29 DIAGNOSIS — E1165 Type 2 diabetes mellitus with hyperglycemia: Secondary | ICD-10-CM | POA: Diagnosis not present

## 2023-05-29 DIAGNOSIS — E039 Hypothyroidism, unspecified: Secondary | ICD-10-CM | POA: Diagnosis not present

## 2023-05-29 DIAGNOSIS — I2510 Atherosclerotic heart disease of native coronary artery without angina pectoris: Secondary | ICD-10-CM | POA: Diagnosis not present

## 2023-08-07 ENCOUNTER — Ambulatory Visit: Payer: PPO | Attending: Cardiovascular Disease | Admitting: Cardiovascular Disease

## 2023-08-07 ENCOUNTER — Encounter: Payer: Self-pay | Admitting: Cardiovascular Disease

## 2023-08-07 ENCOUNTER — Telehealth: Payer: Self-pay | Admitting: *Deleted

## 2023-08-07 DIAGNOSIS — Z8249 Family history of ischemic heart disease and other diseases of the circulatory system: Secondary | ICD-10-CM | POA: Diagnosis not present

## 2023-08-07 DIAGNOSIS — I10 Essential (primary) hypertension: Secondary | ICD-10-CM

## 2023-08-07 DIAGNOSIS — I2510 Atherosclerotic heart disease of native coronary artery without angina pectoris: Secondary | ICD-10-CM

## 2023-08-07 DIAGNOSIS — G4733 Obstructive sleep apnea (adult) (pediatric): Secondary | ICD-10-CM | POA: Diagnosis not present

## 2023-08-07 DIAGNOSIS — E785 Hyperlipidemia, unspecified: Secondary | ICD-10-CM

## 2023-08-07 DIAGNOSIS — Z951 Presence of aortocoronary bypass graft: Secondary | ICD-10-CM

## 2023-08-07 DIAGNOSIS — K219 Gastro-esophageal reflux disease without esophagitis: Secondary | ICD-10-CM | POA: Diagnosis not present

## 2023-08-07 MED ORDER — EZETIMIBE 10 MG PO TABS: 10.0000 mg | ORAL_TABLET | Freq: Every day | ORAL | 3 refills | Status: DC

## 2023-08-07 NOTE — Progress Notes (Addendum)
 Cardiology Office Note    Date:  08/18/2023   ID:  Sue Baker, DOB [birth year], MRN [id number]  PCP:  Tally Joe, MD  Cardiologist:  Nicki Guadalajara, MD    F/U cardiology/sleep evaluation, initially referred through the courtesy of Dr. Norberto Sorenson  History of Present Illness:  Sue Baker is a 67 y.o. female who presents to the office today for a 34 month f/u office cardiology/sleep evaluation.    Sue Baker is originally from [place] and moved here with [name] when she was transferred to the [place] area.  She has a long-standing history of hypertension, diabetes mellitus for at least 6 years, hyperlipidemia, and has a diagnosis of obstructive sleep apnea for almost 20 years.  She received a new CPAP machine in 2015.  She continues to work.  She has had difficulty with right leg numbness and also has noticed some shortness of breath with activity.  She denies any definitive lower extremity pain with walking but admits to numbness.   She has difficulty with knee discomfort.  She has noticed episodes of some stress mediated chest pressure but denies any clear-cut exertional precipitation.  Her last stress test was in [place], [place] at least 8 years ago.    She established care with me in May 2019 and I saw her for follow-up evaluation on March 23, 2008.  She underwent an echo Doppler study on Feb 11, 2018 which essentially was normal.  EF was 60 to 65%.  She had normal diastolic parameters.  There was no significant valvular abnormalities.  She has had issues with back pain and has seen a neurologist and is felt to have symptoms related to L5.  She denies any chest pain.  She is on CPAP for obstructive sleep apnea and uses [facility].    She has had some issues with varicose veins.  She underwent lower extremity arterial Doppler and had normal bilateral ankle-brachial indices.    Since I last evaluated her, she has continued to use CPAP therapy.  Her previous DME company was  [employer] which was purchased by [employer].  She is in need for new supplies.  She contacted [employer] who is now her DME provider but they have no records of her sleep apnea history.  For this reason she presented to the office today so that we can provide information to [employer] and ultimately have her link to our office for care.  She continues to work 12-hour shifts for [employer].  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  Over the past 4 to 5 months she has noticed shortness of breath with activity but denies associated chest tightness..  She is on olmesartan 20 mg for hypertension and is on rosuvastatin 40 mg and fenofibrate for hyperlipidemia.  She is on combination therapy with saxagliptin and Metformin for her diabetes mellitus and takes Lexapro for mild depression.     I had seen her in 2015 and at that time she told me that she was diagnosed with sleep apnea approximately 15 years prior and recalled that she stopped breathing approximately 1 time per minute which correlates to an AHI of approximately 60.  She had been on CPAP therapy until 2015.  When I saw her, her CPAP machine had just broken and was nonfunctional.  She was unable to sleep without CPAP in her sleep quality was poor and she was snoring excessively.  Apparently she underwent a sleep evaluation at the [facility] and received a new ResMed air sense 10 machine.  I tried to obtain records of her sleep study at [facility] but this was not in the epic data since this was done prior to [place] being a part of [place].  However since obtaining a new machine she admits to 100% compliance.  When I saw her in September 2021, she was in need for new supplies.  Her prior DME company was bought out by [employer] and she was unable to obtain supplies from them.  She had broken her right leg in October 2020 and was incapacitated for several months resulting in some weight gain.  She denied associated chest pain  but admitted to some shortness of breath with activity.  I recommended she undergo an echo Doppler study to reassess LV systolic and diastolic function.  I also recommended she undergo routine treadmill test to assess exercise capacity and make certain there was no suggestion of potential ischemic changes.  We were trying to have [employer] link her CPAP device to our office and I recommended follow-up in several months.  She underwent her echo Doppler study in June 26, 2020 which revealed normal systolic function with EF at 60 to 65% without wall motion abnormalities.  She had normal diastolic parameters.  There was mild dilation of the ascending aorta at 39 mm.  Her left atrium was moderately dilated.  A routine treadmill test was done on July 07, 2020.  She had a normal blood pressure response to exercise.  She was asymptomatic throughout the test but developed mild ST segment depression in the inferolateral leads of approximately 2 to 3 mm.  Presently she feels well and denies chest pain or shortness of breath.    When I saw her on July 24, 2020 we had to schedule her for a home sleep study by her DME company since they do not have any of her prior data and needed this so that she could obtain supplies.  In addition, because of her abnormal treadmill test I recommended she undergo coronary CTA for further evaluation of potential coronary artery disease.  She underwent a home sleep study on August 11, 2020 which confirmed severe sleep apnea with an AHI of 39.1.  O2 nadir was significant at 75% during sleep.  She has not yet been set up with her ResMed air sense 10 AutoSet unit.  I last saw her on September 26, 2020.  She underwent coronary CTA on September 18, 2020 which showed significantly elevated calcium score at 1931 Agatston units, placing her in the 90th percentile for age and gender suggesting a high risk for future cardiac events.  She was felt to have possible 50% distal left main stenosis.   There was heavy calcification of the proximal and mid LAD but severe stenosis cannot be excluded due to blooming artifact from the extensive calcification making quantification difficult.  He also has heavy calcification of the proximal and mid ramus with possible moderate stenosis.  He subsequently underwent FFR analysis with FFR significant at 0.73 in the distal LAD.  The left main did not appear to be hemodynamically significant.  Ramus FFR could not be analyzed.  She admits to exertional chest tightness as well as dyspnea.  She continues to work in the yard intermittently.  I performed cardiac catheterization on October 25, 2020 which showed significant multivessel coronary calcification with 50% distal left main stenosis prior to trifurcating into LAD ramus and left  circumflex.  The LAD had 60% focal ostial stenosis and had diffuse calcification in the proximal to mid segment.  The first diagonal vessel at 80% stenosis with 30 to 40% mid LAD stenoses.  There was 70% ostial ramus immediate stenosis, 85% ostial left circumflex stenosis in the RCA had eccentric 50% proximal stenosis with mild irregularities in the proximal to mid and mid distal vessel.  With her longstanding history of diabetes mellitus surgical revascularization was recommended.  She underwent successful CABG surgery x 4 in October 26, 2020 by Dr. Sherlynn Stalls foot with a LIMA to LAD, SVG to OM 3, SVG to ramus, and SVG to first diagonal.  She has subsequently been seen in the office by Edd Fabian.  She continues to see Dr. Susa Raring for primary care.  Presently, she feels well.  She has had issues with blood pressure lability.  She had been on Benicar but ultimately this was discontinued during periods of dizziness and low blood pressure.  She has been off this for 2 months.  Her father also had an abdominal aortic aneurysm.  She denies any recurrent anginal symptomatology.  She continues to use her old CPAP machine with set up date from February 21, 2014 which is no longer wireless.  Over the last 90 days, compliance is excellent with 100% use averaging 7 hours and 45 minutes.  At a set pressure of 15 cm, AHI is 1.0 with her AirSense 10 CPAP (not auto) unit.  She presents for reevaluation.    Past Medical History:  Diagnosis Date   Allergy    Arthritis    Cataract    Diabetes mellitus without complication (HCC)    GERD (gastroesophageal reflux disease)    Hyperlipidemia    Hypertension    Sleep apnea    Thyroid disease     Past Surgical History:  Procedure Laterality Date   BREAST SURGERY     CARPAL TUNNEL RELEASE     COLONOSCOPY  03/03/2020   CORONARY ARTERY BYPASS GRAFT N/A 10/26/2020   Procedure: CORONARY ARTERY BYPASS GRAFTING (CABG), ON PUMP, TIMES FOUR, USING LEFT INTERNAL MAMMARY ARTERY AND ENDOSCOPICALLY HARVESTED RIGHT GREATER SAPHENOUS VEIN;  Surgeon: Corliss Skains, MD;  Location: [facility] OR;  Service: Open Heart Surgery;  Laterality: N/A;  flow trac   EYE SURGERY     KNEE SURGERY     left   LEFT HEART CATH AND CORONARY ANGIOGRAPHY N/A 10/25/2020   Procedure: LEFT HEART CATH AND CORONARY ANGIOGRAPHY;  Surgeon: Lennette Bihari, MD;  Location: [facility] INVASIVE CV LAB;  Service: Cardiovascular;  Laterality: N/A;   TEE WITHOUT CARDIOVERSION N/A 10/26/2020   Procedure: TRANSESOPHAGEAL ECHOCARDIOGRAM (TEE);  Surgeon: Corliss Skains, MD;  Location: [facility] OR;  Service: Open Heart Surgery;  Laterality: N/A;   TUBAL LIGATION      Current Medications: Outpatient Medications Prior to Visit  Medication Sig Dispense Refill   aspirin EC 325 MG EC tablet Take 1 tablet (325 mg total) by mouth daily.     blood glucose meter kit and supplies KIT Dispense based on patient and insurance preference. Use up to four times daily as directed. (FOR ICD-10 E11.9) 1 each 0   escitalopram (LEXAPRO) 10 MG tablet TAKE 1 TABLET BY MOUTH DAILY 90 tablet 3   esomeprazole (NEXIUM 24HR) 20 MG capsule Take 1 capsule (20 mg total) by mouth daily at 12  noon.     fenofibrate (TRICOR) 145 MG tablet Take 1 tablet (145 mg total) by mouth  daily. 90 tablet 1   JARDIANCE 25 MG TABS tablet Take 25 mg by mouth daily.     levothyroxine (SYNTHROID) 150 MCG tablet Take 1 tablet (150 mcg total) by mouth daily before breakfast. TAKE 1 TABLET BY MOUTH DAILY BEFORE BREAKFAST     metFORMIN (GLUCOPHAGE-XR) 500 MG 24 hr tablet Take 1,000 mg by mouth 2 (two) times daily.     metoprolol succinate (TOPROL XL) 25 MG 24 hr tablet Take 0.5 tablets (12.5 mg total) by mouth 2 (two) times daily.     Multiple Vitamins-Minerals (WOMENS MULTI VITAMIN & MINERAL) TABS Take 1 tablet by mouth daily.     Omega-3 Fatty Acids (FISH OIL) 1000 MG CAPS Take 2 capsules (2,000 mg total) by mouth 2 (two) times daily.  0   ONETOUCH VERIO test strip USE AS DIRECTED 100 strip 5   Propylene Glycol (SYSTANE COMPLETE) 0.6 % SOLN Place 1 drop into both eyes 4 (four) times daily as needed (dry eyes).     rosuvastatin (CRESTOR) 40 MG tablet Take 1 tablet (40 mg total) by mouth daily. 90 tablet 1   traMADol (ULTRAM) 50 MG tablet Take 1 tablet (50 mg total) by mouth every 6 (six) hours as needed for moderate pain. 30 tablet 0   Continuous Blood Gluc Sensor (FREESTYLE LIBRE 2 SENSOR) MISC      insulin glargine (LANTUS SOLOSTAR) 100 UNIT/ML Solostar Pen Inject 15 Units into the skin at bedtime.     No facility-administered medications prior to visit.     Allergies:   Trulicity [dulaglutide]   Social History   Socioeconomic History   Marital status: Divorced    Spouse name: Not on file   Number of children: Not on file   Years of education: 14   Highest education level: Some college, no degree  Occupational History    Employer: [employer]  Tobacco Use   Smoking status: Former    Current packs/day: 0.00    Types: Cigarettes    Quit date: 09/30/1999    Years since quitting: 23.8   Smokeless tobacco: Never  Vaping Use   Vaping status: Never Used  Substance and Sexual Activity    Alcohol use: Yes    Comment: rarely   Drug use: No   Sexual activity: Not Currently  Other Topics Concern   Not on file  Social History Narrative   Lives in a one story home.  Her daughter and grandson live with her.  Works at [employer]. 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  Education: some college.    Social Determinants of Health   Financial Resource Strain: Not on file  Food Insecurity: Not on file  Transportation Needs: Not on file  Physical Activity: Not on file  Stress: Not on file  Social Connections: Not on file    Additional social history is notable that she is divorced for 6 years.  She has 2 children and 4 grandchildren.  She had smoked for 15 years but quit smoking in 1998.  She completed 12th grade of education.  Family History:  The patient's family history includes Cancer in her sister; Diabetes in her maternal grandfather; Healthy in her mother; Heart disease in her father and maternal grandfather; Hypertension in her father and maternal grandfather; Peripheral Artery Disease in her father; Thyroid disease in her sister.  Her mother is 8 years old.  Her father is 54 years old and has heart disease dating back to 63.  One sister  age 58.  His Hodgkin's disease.  The other sister age 66 is stable.  She has 2 children.  ROS General: Negative; No fevers, chills, or night sweats;  HEENT: Negative; No changes in vision or hearing, sinus congestion, difficulty swallowing Pulmonary: Negative; No cough, wheezing, shortness of breath, hemoptysis Cardiovascular: see HPI GI: Negative; No nausea, vomiting, diarrhea, or abdominal pain GU: Negative; No dysuria, hematuria, or difficulty voiding Musculoskeletal: No back discomfort Hematologic/Oncology: Negative; no easy bruising, bleeding Endocrine: Negative; no heat/cold intolerance; no diabetes Neuro: Right leg numbness Skin: Negative; No rashes or skin lesions Psychiatric: Negative; No behavioral problems,  depression Sleep: History of severe obstructive sleep apnea, on CPAP therapy for 20 years.  She received a new machine in 2015 following the sleep study that was done at the [facility].  Prior history of snoring, daytime sleepiness, nocturia; nobruxism, restless legs, hypnogognic hallucinations, no cataplexy Other comprehensive 14 point system review is negative.   PHYSICAL EXAM:   VS:  BP 112/78   Pulse (!) 52   Ht 5\' 4"  (1.626 m)   Wt 189 lb 12.8 oz (86.1 kg)   SpO2 95%   BMI 32.58 kg/m     Repeat blood pressure by me was 122/76  Wt Readings from Last 3 Encounters:  08/07/23 189 lb 12.8 oz (86.1 kg)  02/20/21 181 lb 9.6 oz (82.4 kg)  12/08/20 189 lb (85.7 kg)   General: Alert, oriented, no distress.  Skin: normal turgor, no rashes, warm and dry HEENT: Normocephalic, atraumatic. Pupils equal round and reactive to light; sclera anicteric; extraocular muscles intact;  Nose without nasal septal hypertrophy Mouth/Parynx benign; Mallinpatti scale 3 Neck: No JVD, no carotid bruits; normal carotid upstroke Lungs: clear to ausculatation and percussion; no wheezing or rales Chest wall: without tenderness to palpitation Heart: PMI not displaced, RRR, s1 s2 normal, 1/6 systolic murmur, no diastolic murmur, no rubs, gallops, thrills, or heaves Abdomen: soft, nontender; no hepatosplenomehaly, BS+; abdominal aorta nontender and not dilated by palpation. Back: no CVA tenderness Pulses 2+ Musculoskeletal: full range of motion, normal strength, no joint deformities Extremities: no clubbing cyanosis or edema, Homan's sign negative  Neurologic: grossly nonfocal; Cranial nerves grossly wnl Psychologic: Normal mood and affect    Studies/Labs Reviewed:   EKG Interpretation Date/Time:  Thursday August 07 2023 14:57:39 EST Ventricular Rate:  52 PR Interval:  120 QRS Duration:  86 QT Interval:  448 QTC Calculation: 416 R Axis:   78  Text Interpretation: Sinus  bradycardia Nonspecific ST and T wave abnormality When compared with ECG of 27-Oct-2020 07:12, ST no longer elevated in Lateral leads T wave inversion now evident in Anterolateral leads Confirmed by Nicki Guadalajara ([id number]) on 08/07/2023 4:16:06 PM    September 26, 2020 ECG (independently read by me): NSR with mild sinus arryrthmia, QTc 420   July 24, 2020 ECG (independently read by me): NSR at 60 with mild sinus arrythmia   Se[tember  2021ECG (independently read by me): Sinus rhythm at 63 bpm, PAC, normal intervals.  No significant ST changes.  July 2019 ECG (independently read by me): Normal sinus rhythm at 62 bpm.  Jan 28, 2018 ECG (independently read by me): Normal sinus rhythm at 89 bpm.  Nonspecific ST changes.  Normal intervals.  No ectopy.                                Recent Labs:    Latest  Ref Rng & Units 10/31/2020   12:48 AM 10/29/2020    1:45 AM 10/28/2020    4:34 AM  BMP  Glucose 70 - 99 mg/dL 315  176  160   BUN 8 - 23 mg/dL 14  13  13    Creatinine 0.44 - 1.00 mg/dL 7.37  1.06  2.69   Sodium 135 - 145 mmol/L 138  137  134   Potassium 3.5 - 5.1 mmol/L 3.8  3.5  4.2   Chloride 98 - 111 mmol/L 99  98  101   CO2 22 - 32 mmol/L 27  30  25    Calcium 8.9 - 10.3 mg/dL 9.1  8.8  8.9         Latest Ref Rng & Units 10/26/2020    5:57 AM 11/19/2019   11:45 AM 11/05/2018   11:00 AM  Hepatic Function  Total Protein 6.5 - 8.1 g/dL 6.1  6.8  7.1   Albumin 3.5 - 5.0 g/dL 3.5  4.3  4.5   AST 15 - 41 U/L 21  43  46   ALT 0 - 44 U/L 22  37  39   Alk Phosphatase 38 - 126 U/L 52  125  83   Total Bilirubin 0.3 - 1.2 mg/dL 1.0  0.6  0.6        Latest Ref Rng & Units 10/29/2020    1:45 AM 10/28/2020    4:34 AM 10/27/2020    4:32 PM  CBC  WBC 4.0 - 10.5 K/uL 9.1  10.4  9.8   Hemoglobin 12.0 - 15.0 g/dL 9.1  9.0  9.1   Hematocrit 36.0 - 46.0 % 27.8  27.4  28.6   Platelets 150 - 400 K/uL 155  158  158    Lab Results  Component Value Date   MCV 98.6 10/29/2020   MCV 100.4 (H) 10/28/2020    MCV 100.7 (H) 10/27/2020   Lab Results  Component Value Date   TSH 0.021 (L) 11/19/2019   Lab Results  Component Value Date   HGBA1C 6.9 (H) 10/25/2020     BNP No results found for: "BNP"  ProBNP No results found for: "PROBNP"   Lipid Panel     Component Value Date/Time   CHOL 123 11/14/2020 1101   TRIG 153 (H) 11/14/2020 1101   HDL 34 (L) 11/14/2020 1101   CHOLHDL 3.6 11/14/2020 1101   CHOLHDL 5.2 (H) 08/14/2016 0921   VLDL 27 08/14/2016 0921   LDLCALC 63 11/14/2020 1101   LDLDIRECT 153 (H) 11/17/2017 1541     RADIOLOGY: No results found.   08/10/2020 CLINICAL INFORMATION Sleep Study Type: HST   Indication for sleep study: reconfirmation of sleep apnea; patient is on CPAP therapy but DME provider ([employer]) needs reconfirmation for her to receive supplies through insurance. Patient has been on CPAP for 20 years and received a new machine in 2015.   Epworth Sleepiness Score: 22   SLEEP STUDY TECHNIQUE A multi-channel overnight portable sleep study was performed. The channels recorded were: nasal airflow, thoracic respiratory movement, and oxygen saturation with a pulse oximetry. Snoring was also monitored.   MEDICATIONS aspirin 81 MG tablet blood glucose meter kit and supplies KIT Continuous Blood Gluc Sensor (FREESTYLE LIBRE 2 SENSOR) MISC cyclobenzaprine (FLEXERIL) 10 MG tablet escitalopram (LEXAPRO) 10 MG tablet esomeprazole (NEXIUM 24HR) 20 MG capsule fenofibrate (TRICOR) 145 MG tablet insulin degludec (TRESIBA FLEXTOUCH) 200 UNIT/ML FlexTouch Pen levothyroxine (SYNTHROID) 150 MCG tablet meloxicam (MOBIC) 15 MG tablet metFORMIN (  GLUCOPHAGE-XR) 500 MG 24 hr tablet metoprolol tartrate (LOPRESSOR) 25 MG tablet Multiple Vitamins-Minerals (WOMENS MULTI VITAMIN & MINERAL) TABS olmesartan (BENICAR) 20 MG tablet ONETOUCH VERIO test strip rosuvastatin (CRESTOR) 40 MG tablet traMADol (ULTRAM) 50 MG tablet Patient self administered medications include: N/A.    SLEEP ARCHITECTURE Patient was studied for 454.7 minutes. The sleep efficiency was 100.0 % and the patient was supine for 49.5%. The arousal index was 0.0 per hour.   RESPIRATORY PARAMETERS The overall AHI was 39.1 per hour, with a central apnea index of 0.0 per hour. The severity during REM sleep cannot be assessed on this home study.   The oxygen nadir was 75% during sleep.   CARDIAC DATA Mean heart rate during sleep was 60.1 bpm.   IMPRESSIONS - Severe obstructive sleep apnea occurred during this study (AHI 39.1/h). - No significant central sleep apnea occurred during this study (CAI = 0.0/h). - Severe oxygen desaturation to a nadir of 75%. - Patient snored 4.4% during the sleep.   DIAGNOSIS - Obstructive Sleep Apnea (G47.33) - Nocturnal Hypoxemia (G47.36)   RECOMMENDATIONS - The patient received her last machine in 2015 which currently is set at 15 cm water pressure. If she is in need of a new machine recommend a ResMed AirSence 10 Auto unit. - Positional therapy avoiding supine position during sleep. - Avoid alcohol, sedatives and other CNS depressants that may worsen sleep apnea and disrupt normal sleep architecture. - Sleep hygiene should be reviewed to assess factors that may improve sleep quality. - Weight management and regular exercise should be initiated or continued. - Return to Sleep Center to discuss the results of this study    Mid RCA lesion is 20% stenosed. Dist RCA lesion is 30% stenosed. Prox RCA-1 lesion is 50% stenosed. Prox RCA-2 lesion is 30% stenosed. Ost LAD to Prox LAD lesion is 60% stenosed. Ramus lesion is 70% stenosed. Ost Cx to Prox Cx lesion is 85% stenosed. Prox LAD to Mid LAD lesion is 30% stenosed. Mid LAD lesion is 40% stenosed. 1st Diag lesion is 80% stenosed. Mid LM to Dist LM lesion is 50% stenosed.   Significant multivessel coronary calcification with 50% distal left main stenosis prior to trifurcating into the LAD, ramus  intermediate and left circumflex vessel. The LAD has 60% focal ostial stenosis and then had diffuse calcification in the proximal to mid segment. The first diagonal vessel 80% stenoses with 30 to 40% mid LAD stenoses; 70% ostial ramus intermediate stenosis; 85% ostial left circumflex stenosis the RCA has 50% eccentric proximal stenosis and mild irregularities of 30%, 20% and 30% in the proximal to mid and mid distal vessel.   Normal LV function with EF 55 to 60%. LVEDP 10 mmHg.    RECOMMENDATION:  Patient with longstanding history of diabetes mellitus for over 20 years, recommend surgical consultation for CABG revascularization surgery. We will initially plan to keep the patient in the hospital so that surgery can see hopefully today. Timing of surgery will be dependent upon surgical schedule.       ASSESSMENT:    1. Coronary artery disease involving native coronary artery of native heart without angina pectoris   2. S/P CABG x 4   3. Primary hypertension   4. OSA (obstructive sleep apnea)   5. Hyperlipidemia LDL goal <55   6. Family history of abdominal aortic aneurysm   7. Gastroesophageal reflux disease, unspecified whether esophagitis present     PLAN:  Sue Baker is a very pleasant 20 -  year-old female who is a long-standing history of hypertension, hyperlipidemia, diabetes mellitus, hypothyroidism, depression, as well as obstructive sleep apnea.  She has been on CPAP therapy for over 20 years and received a new machine in 2015 after undergoing a subsequent sleep study at the [facility].   Unfortunately [facility] had her prior sleep data in this unfortunately was never transferred to [facility].  She was required to undergo a repeat sleep evaluation for documentation of her sleep apnea and this was recently completed on August 10, 2020 again confirming severe sleep apnea with a overall AHI on a home study of 39.1 and significant oxygen desaturation to a  nadir of 75%.  She has continued to use her ResMed AirSense 10 CPAP unit with initial set up date in 2015 and had never received the subsequent auto unit.  Her most recent download from August 16 through August 06, 2023 confirms 100% compliance with average usage 7 hours 45 minutes.  At 15 cm set pressure AHI is 1.0.  She qualifies for a new machine and we will schedule her to obtain a new ResMed AirSense 11 auto unit with EPR of 3 and a pressure range of 15 to 20 cm.  Since her last evaluation with me, I performed cardiac catheterization in 2022 which revealed significant multivessel CAD including left main, ostial LAD ramus intermediate and circumflex vessel.  She subsequently underwent successful CABG revascularization surgery by Dr. Cliffton Asters on October 26, 2020.  She has had some issues with blood pressure being elevated leading to resumption of olmesartan for several months and then with blood pressure lowering and dizziness she has not been taking this for the last 3 months.  There also is family history for abdominal aortic aneurysm in her father.  Presently, I am recommending she undergo a repeat echo Doppler study 2-1/2 years following her successful surgery.  I am also scheduling her for an abdominal aortic ultrasound to assess for AAA.  Recent lipid studies have shown significant cholesterol elevation on March 26, 2023 with total cholesterol 10/27/2010, LDL cholesterol 126, triglycerides 251, and HDL 41.  She has been on rosuvastatin 40 mg fenofibrate and fish oil and I am also recommending the addition of Zetia 10 mg for more aggressive lipid-lowering therapy.  If she cannot reach target with LDL goal less than 55 she will be a candidate for initiation of PCSK9 inhibition.  Her blood pressure today is stable and on repeat by me was 122/76 with a pulse rate of 52.  She is on low-dose metoprolol succinate 12.5 mg which she takes twice a day.  She is diabetic on Jardiance.  She takes Nexium for GERD.  She  continues to be on levothyroxine 150 mcg for hypothyroidism.  In 3 months I am recommending she undergo follow-up laboratory with a comprehensive metabolic panel, lipid panel, and LP(a).  I will need to see her within 3 months of receiving a new ResMed AirSense 11 AutoSet unit for follow-up evaluation.    Medication Adjustments/Labs and Tests Ordered: Current medicines are reviewed at length with the patient today.  Concerns regarding medicines are outlined above.  Medication changes, Labs and Tests ordered today are listed in the Patient Instructions below. Patient Instructions  Medication Instructions:  Begin Zetia 10mg  daily   *If you need a refill on your cardiac medications before your next appointment, please call your pharmacy*   Lab Work: Return is three months for fasting labs CMET, LIPID, LPa If you  have labs (blood work) drawn today and your tests are completely normal, you will receive your results only by: MyChart Message (if you have MyChart) OR A paper copy in the mail If you have any lab test that is abnormal or we need to change your treatment, we will call you to review the results.   Testing/Procedures: Your physician has requested that you have an echocardiogram. Echocardiography is a painless test that uses sound waves to create images of your heart. It provides your doctor with information about the size and shape of your heart and how well your heart's chambers and valves are working. This procedure takes approximately one hour. There are no restrictions for this procedure. Please do NOT wear cologne, perfume, aftershave, or lotions (deodorant is allowed).   Please arrive 15 minutes prior to your appointment time.  Your physician has requested that you have an abdominal aorta duplex. During this test, an ultrasound is used to evaluate the aorta. Allow 30 minutes for this exam. Do not eat after midnight the day before and avoid carbonated beverages.  Please note:  We ask at that you not bring children with you during ultrasound (echo/ vascular) testing. Due to room size and safety concerns, children are not allowed in the ultrasound rooms during exams. Our front office staff cannot provide observation of children in our lobby area while testing is being conducted. An adult accompanying a patient to their appointment will only be allowed in the ultrasound room at the discretion of the ultrasound technician under special circumstances. We apologize for any inconvenience.   Please note: We ask at that you not bring children with you during ultrasound (echo/ vascular) testing. Due to room size and safety concerns, children are not allowed in the ultrasound rooms during exams. Our front office staff cannot provide observation of children in our lobby area while testing is being conducted. An adult accompanying a patient to their appointment will only be allowed in the ultrasound room at the discretion of the ultrasound technician under special circumstances. We apologize for any inconvenience.    Follow-Up: At [facility], you and your health needs are our priority.  As part of our continuing mission to provide you with exceptional heart care, we have created designated Provider Care Teams.  These Care Teams include your primary Cardiologist (physician) and Advanced Practice Providers (APPs -  Physician Assistants and Nurse Practitioners) who all work together to provide you with the care you need, when you need it.  We recommend signing up for the patient portal called "MyChart".  Sign up information is provided on this After Visit Summary.  MyChart is used to connect with patients for Virtual Visits (Telemedicine).  Patients are able to view lab/test results, encounter notes, upcoming appointments, etc.  Non-urgent messages can be sent to your provider as well.   To learn more about what you can do with MyChart, go to [email or web address].    Your next  appointment:   3 month(s)  Provider:   Nicki Guadalajara, MD     If you have any questions or concerns regarding your c-pap, bi-pap or sleep accessories, please contact Brandie Rorie at [phone].      Signed, Nicki Guadalajara, MD  08/18/2023 11:49 AM    [facility] [address], [address], [place], [place]  [address] Phone: [phone]

## 2023-08-07 NOTE — Telephone Encounter (Signed)
Spoke with pt to advise of extended wait time for appointment today. Pt verbalizes understanding and appreciation of call.

## 2023-08-07 NOTE — Patient Instructions (Addendum)
Medication Instructions:  Begin Zetia 10mg  daily   *If you need a refill on your cardiac medications before your next appointment, please call your pharmacy*   Lab Work: Return is three months for fasting labs CMET, LIPID, LPa If you have labs (blood work) drawn today and your tests are completely normal, you will receive your results only by: MyChart Message (if you have MyChart) OR A paper copy in the mail If you have any lab test that is abnormal or we need to change your treatment, we will call you to review the results.   Testing/Procedures: Your physician has requested that you have an echocardiogram. Echocardiography is a painless test that uses sound waves to create images of your heart. It provides your doctor with information about the size and shape of your heart and how well your heart's chambers and valves are working. This procedure takes approximately one hour. There are no restrictions for this procedure. Please do NOT wear cologne, perfume, aftershave, or lotions (deodorant is allowed).   Please arrive 15 minutes prior to your appointment time.  Your physician has requested that you have an abdominal aorta duplex. During this test, an ultrasound is used to evaluate the aorta. Allow 30 minutes for this exam. Do not eat after midnight the day before and avoid carbonated beverages.  Please note: We ask at that you not bring children with you during ultrasound (echo/ vascular) testing. Due to room size and safety concerns, children are not allowed in the ultrasound rooms during exams. Our front office staff cannot provide observation of children in our lobby area while testing is being conducted. An adult accompanying a patient to their appointment will only be allowed in the ultrasound room at the discretion of the ultrasound technician under special circumstances. We apologize for any inconvenience.   Please note: We ask at that you not bring children with you during  ultrasound (echo/ vascular) testing. Due to room size and safety concerns, children are not allowed in the ultrasound rooms during exams. Our front office staff cannot provide observation of children in our lobby area while testing is being conducted. An adult accompanying a patient to their appointment will only be allowed in the ultrasound room at the discretion of the ultrasound technician under special circumstances. We apologize for any inconvenience.    Follow-Up: At [facility], you and your health needs are our priority.  As part of our continuing mission to provide you with exceptional heart care, we have created designated Provider Care Teams.  These Care Teams include your primary Cardiologist (physician) and Advanced Practice Providers (APPs -  Physician Assistants and Nurse Practitioners) who all work together to provide you with the care you need, when you need it.  We recommend signing up for the patient portal called "MyChart".  Sign up information is provided on this After Visit Summary.  MyChart is used to connect with patients for Virtual Visits (Telemedicine).  Patients are able to view lab/test results, encounter notes, upcoming appointments, etc.  Non-urgent messages can be sent to your provider as well.   To learn more about what you can do with MyChart, go to [email or web address].    Your next appointment:   3 month(s)  Provider:   Nicki Guadalajara, MD     If you have any questions or concerns regarding your c-pap, bi-pap or sleep accessories, please contact Brandie Rorie at [phone].

## 2023-08-29 ENCOUNTER — Other Ambulatory Visit: Payer: Self-pay | Admitting: *Deleted

## 2023-08-29 ENCOUNTER — Ambulatory Visit (HOSPITAL_COMMUNITY)
Admission: RE | Admit: 2023-08-29 | Discharge: 2023-08-29 | Disposition: A | Discharge status: Home / Self Care | Payer: PPO | Source: Ambulatory Visit | Attending: Cardiology | Admitting: Cardiology

## 2023-08-29 ENCOUNTER — Telehealth: Payer: Self-pay | Admitting: *Deleted

## 2023-08-29 DIAGNOSIS — E785 Hyperlipidemia, unspecified: Secondary | ICD-10-CM | POA: Insufficient documentation

## 2023-08-29 DIAGNOSIS — I2510 Atherosclerotic heart disease of native coronary artery without angina pectoris: Secondary | ICD-10-CM | POA: Insufficient documentation

## 2023-08-29 DIAGNOSIS — Z8249 Family history of ischemic heart disease and other diseases of the circulatory system: Secondary | ICD-10-CM | POA: Diagnosis not present

## 2023-08-29 DIAGNOSIS — I7143 Infrarenal abdominal aortic aneurysm, without rupture: Secondary | ICD-10-CM

## 2023-08-29 DIAGNOSIS — I10 Essential (primary) hypertension: Secondary | ICD-10-CM | POA: Insufficient documentation

## 2023-08-29 DIAGNOSIS — R160 Hepatomegaly, not elsewhere classified: Secondary | ICD-10-CM | POA: Diagnosis not present

## 2023-08-29 DIAGNOSIS — K7689 Other specified diseases of liver: Secondary | ICD-10-CM | POA: Diagnosis not present

## 2023-08-29 DIAGNOSIS — R918 Other nonspecific abnormal finding of lung field: Secondary | ICD-10-CM | POA: Diagnosis not present

## 2023-08-29 DIAGNOSIS — E119 Type 2 diabetes mellitus without complications: Secondary | ICD-10-CM | POA: Insufficient documentation

## 2023-08-29 DIAGNOSIS — K8020 Calculus of gallbladder without cholecystitis without obstruction: Secondary | ICD-10-CM | POA: Diagnosis not present

## 2023-08-29 LAB — POCT I-STAT CREATININE: Creatinine, Ser: 1 mg/dL (ref 0.44–1.00)

## 2023-08-29 MED ORDER — SODIUM CHLORIDE (PF) 0.9 % IJ SOLN
INTRAMUSCULAR | Status: AC
  Filled 2023-08-29: qty 50

## 2023-08-29 MED ORDER — IOHEXOL 350 MG/ML SOLN
100.0000 mL | Freq: Once | INTRAVENOUS | Status: AC | PRN
  Administered 2023-08-29: 100 mL via INTRAVENOUS

## 2023-08-29 NOTE — Telephone Encounter (Signed)
Patient here for abdominal aorta scan and there is a question of dissection. Per dr hochrein, patient will go to [facility] today at 1:30 pm for a CTA chest and abdomen/pelvis. They will call the report. Stat message sent to pre-cert.

## 2023-08-29 NOTE — Progress Notes (Addendum)
AAA duplex completed. Preliminary results discussed with [facility]- Dr. Antoine Poche. [facility] to reach out to patient re: preliminary results.  08/29/2023 9:51 AM Eula Fried., MHA, RVT, RDCS, RDMS

## 2023-09-12 DIAGNOSIS — K219 Gastro-esophageal reflux disease without esophagitis: Secondary | ICD-10-CM | POA: Diagnosis not present

## 2023-09-12 DIAGNOSIS — Z0000 Encounter for general adult medical examination without abnormal findings: Secondary | ICD-10-CM | POA: Diagnosis not present

## 2023-09-12 DIAGNOSIS — E039 Hypothyroidism, unspecified: Secondary | ICD-10-CM | POA: Diagnosis not present

## 2023-09-12 DIAGNOSIS — E7800 Pure hypercholesterolemia, unspecified: Secondary | ICD-10-CM | POA: Diagnosis not present

## 2023-09-12 DIAGNOSIS — E2839 Other primary ovarian failure: Secondary | ICD-10-CM | POA: Diagnosis not present

## 2023-09-12 DIAGNOSIS — M5441 Lumbago with sciatica, right side: Secondary | ICD-10-CM | POA: Diagnosis not present

## 2023-09-12 DIAGNOSIS — M1711 Unilateral primary osteoarthritis, right knee: Secondary | ICD-10-CM | POA: Diagnosis not present

## 2023-09-12 DIAGNOSIS — Z951 Presence of aortocoronary bypass graft: Secondary | ICD-10-CM | POA: Diagnosis not present

## 2023-09-12 DIAGNOSIS — Z1159 Encounter for screening for other viral diseases: Secondary | ICD-10-CM | POA: Diagnosis not present

## 2023-09-12 DIAGNOSIS — Z23 Encounter for immunization: Secondary | ICD-10-CM | POA: Diagnosis not present

## 2023-09-12 DIAGNOSIS — F339 Major depressive disorder, recurrent, unspecified: Secondary | ICD-10-CM | POA: Diagnosis not present

## 2023-09-12 DIAGNOSIS — I10 Essential (primary) hypertension: Secondary | ICD-10-CM | POA: Diagnosis not present

## 2023-09-12 DIAGNOSIS — E119 Type 2 diabetes mellitus without complications: Secondary | ICD-10-CM | POA: Diagnosis not present

## 2023-09-15 ENCOUNTER — Ambulatory Visit (HOSPITAL_COMMUNITY): Payer: PPO | Attending: Cardiovascular Disease

## 2023-09-15 DIAGNOSIS — I10 Essential (primary) hypertension: Secondary | ICD-10-CM | POA: Insufficient documentation

## 2023-09-15 DIAGNOSIS — G4730 Sleep apnea, unspecified: Secondary | ICD-10-CM | POA: Insufficient documentation

## 2023-09-15 DIAGNOSIS — E119 Type 2 diabetes mellitus without complications: Secondary | ICD-10-CM | POA: Insufficient documentation

## 2023-09-15 DIAGNOSIS — E785 Hyperlipidemia, unspecified: Secondary | ICD-10-CM | POA: Diagnosis not present

## 2023-09-15 DIAGNOSIS — Z951 Presence of aortocoronary bypass graft: Secondary | ICD-10-CM | POA: Diagnosis not present

## 2023-09-15 LAB — ECHOCARDIOGRAM COMPLETE
Calc EF: 59.9 %
S' Lateral: 2.66 cm
Single Plane A2C EF: 60.2 %
Single Plane A4C EF: 60.1 %

## 2023-11-13 ENCOUNTER — Ambulatory Visit: Payer: PPO | Admitting: Cardiovascular Disease

## 2023-11-17 ENCOUNTER — Encounter: Payer: Self-pay | Admitting: Cardiovascular Disease

## 2023-11-17 ENCOUNTER — Ambulatory Visit: Payer: PPO | Attending: Cardiovascular Disease | Admitting: Cardiovascular Disease

## 2023-11-17 VITALS — BP 112/64 | HR 58 | Ht 64.0 in | Wt 188.0 lb

## 2023-11-17 DIAGNOSIS — K219 Gastro-esophageal reflux disease without esophagitis: Secondary | ICD-10-CM

## 2023-11-17 DIAGNOSIS — Z951 Presence of aortocoronary bypass graft: Secondary | ICD-10-CM | POA: Diagnosis not present

## 2023-11-17 DIAGNOSIS — I10 Essential (primary) hypertension: Secondary | ICD-10-CM

## 2023-11-17 DIAGNOSIS — I2510 Atherosclerotic heart disease of native coronary artery without angina pectoris: Secondary | ICD-10-CM | POA: Diagnosis not present

## 2023-11-17 DIAGNOSIS — Z8249 Family history of ischemic heart disease and other diseases of the circulatory system: Secondary | ICD-10-CM

## 2023-11-17 DIAGNOSIS — G4733 Obstructive sleep apnea (adult) (pediatric): Secondary | ICD-10-CM | POA: Diagnosis not present

## 2023-11-17 DIAGNOSIS — E785 Hyperlipidemia, unspecified: Secondary | ICD-10-CM

## 2023-11-17 NOTE — Progress Notes (Unsigned)
 Cardiology Office Note    Date:  11/19/2023   ID:  Sue Baker, DOB [birth year], MRN [id number]  PCP:  Tally Joe, MD  Cardiologist:  Nicki Guadalajara, MD   3 month  F/U cardiology/sleep evaluation, initially referred through the courtesy of Dr. Norberto Sorenson  History of Present Illness:  Sue Baker is a 68 y.o. female who presents to the office today for a 3 month f/u office cardiology/sleep evaluation.    Sue Baker is originally from [place] and moved here with [name] when she was transferred to the [place] area.  She has a long-standing history of hypertension, diabetes mellitus for at least 6 years, hyperlipidemia, and has a diagnosis of obstructive sleep apnea for almost 20 years.  She received a new CPAP machine in 2015.  She continues to work.  She has had difficulty with right leg numbness and also has noticed some shortness of breath with activity.  She denies any definitive lower extremity pain with walking but admits to numbness.   She has difficulty with knee discomfort.  She has noticed episodes of some stress mediated chest pressure but denies any clear-cut exertional precipitation.  Her last stress test was in [place], [place] at least 8 years ago.    She established care with me in May 2019 and I saw her for follow-up evaluation on March 23, 2008.  She underwent an echo Doppler study on Feb 11, 2018 which essentially was normal.  EF was 60 to 65%.  She had normal diastolic parameters.  There was no significant valvular abnormalities.  She has had issues with back pain and has seen a neurologist and is felt to have symptoms related to L5.  She denies any chest pain.  She is on CPAP for obstructive sleep apnea and uses [facility].    She has had some issues with varicose veins.  She underwent lower extremity arterial Doppler and had normal bilateral ankle-brachial indices.    Since I last evaluated her, she has continued to use CPAP therapy.  Her previous DME  company was [employer] which was purchased by [employer].  She is in need for new supplies.  She contacted [employer] who is now her DME provider but they have no records of her sleep apnea history.  For this reason she presented to the office today so that we can provide information to [employer] and ultimately have her link to our office for care.  She continues to work 12-hour shifts for [employer].  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  Over the past 4 to 5 months she has noticed shortness of breath with activity but denies associated chest tightness..  She is on olmesartan 20 mg for hypertension and is on rosuvastatin 40 mg and fenofibrate for hyperlipidemia.  She is on combination therapy with saxagliptin and Metformin for her diabetes mellitus and takes Lexapro for mild depression.    I had seen her in 2015 and at that time she told me that she was diagnosed with sleep apnea approximately 15 years prior and recalled that she stopped breathing approximately 1 time per minute which correlates to an AHI of approximately 60.  She had been on CPAP therapy until 2015.  When I saw her, her CPAP machine had just broken and was nonfunctional.  She was unable to sleep without CPAP in her sleep quality was poor and she was snoring excessively.  Apparently [facility] and received a new ResMed air sense 10 machine.  I tried to obtain records of her sleep study at [facility] but this was not in the epic data since this was done prior to [place] being a part of [place].  However since obtaining a new machine she admits to 100% compliance.  When I saw her in September 2021, she was in need for new supplies.  Her prior DME company was bought out by [employer] and she was unable to obtain supplies from them.  She had broken her right leg in October 2020 and was incapacitated for several months resulting in some weight gain.  She denied associated  chest pain but admitted to some shortness of breath with activity.  I recommended she undergo an echo Doppler study to reassess LV systolic and diastolic function.  I also recommended she undergo routine treadmill test to assess exercise capacity and make certain there was no suggestion of potential ischemic changes.  We were trying to have [employer] link her CPAP device to our office and I recommended follow-up in several months.  She underwent her echo Doppler study in June 26, 2020 which revealed normal systolic function with EF at 60 to 65% without wall motion abnormalities.  She had normal diastolic parameters.  There was mild dilation of the ascending aorta at 39 mm.  Her left atrium was moderately dilated.  A routine treadmill test was done on July 07, 2020.  She had a normal blood pressure response to exercise.  She was asymptomatic throughout the test but developed mild ST segment depression in the inferolateral leads of approximately 2 to 3 mm.  Presently she feels well and denies chest pain or shortness of breath.    When I saw her on July 24, 2020 we had to schedule her for a home sleep study by her DME company since they do not have any of her prior data and needed this so that she could obtain supplies.  In addition, because of her abnormal treadmill test I recommended she undergo coronary CTA for further evaluation of potential coronary artery disease.  She underwent a home sleep study on August 11, 2020 which confirmed severe sleep apnea with an AHI of 39.1.  O2 nadir was significant at 75% during sleep.  She has not yet been set up with her ResMed air sense 10 AutoSet unit.  I last saw her on September 26, 2020.  She underwent coronary CTA on September 18, 2020 which showed significantly elevated calcium score at 1931 Agatston units, placing her in the 90th percentile for age and gender suggesting a high risk for future cardiac events.  She was felt to have possible 50% distal left main  stenosis.  There was heavy calcification of the proximal and mid LAD but severe stenosis cannot be excluded due to blooming artifact from the extensive calcification making quantification difficult.  He also has heavy calcification of the proximal and mid ramus with possible moderate stenosis.  He subsequently underwent FFR analysis with FFR significant at 0.73 in the distal LAD.  The left main did not appear to be hemodynamically significant.  Ramus FFR could not be analyzed.  She admits to exertional chest tightness as well as dyspnea.  She continues to work in the yard intermittently.  I performed cardiac catheterization on October 25, 2020 which showed significant multivessel coronary calcification with 50% distal left main stenosis prior to trifurcating into LAD ramus and  left circumflex.  The LAD had 60% focal ostial stenosis and had diffuse calcification in the proximal to mid segment.  The first diagonal vessel at 80% stenosis with 30 to 40% mid LAD stenoses.  There was 70% ostial ramus immediate stenosis, 85% ostial left circumflex stenosis in the RCA had eccentric 50% proximal stenosis with mild irregularities in the proximal to mid and mid distal vessel.  With her longstanding history of diabetes mellitus surgical revascularization was recommended.  She underwent successful CABG surgery x 4 in October 26, 2020 by Dr. Cliffton Asters with a LIMA to LAD, SVG to OM 3, SVG to ramus, and SVG to first diagonal.  She has subsequently been seen in the office by Edd Fabian.  She continues to see Dr. Susa Raring for primary care.  I last saw her on August 07, 2023.  She was having some issues with blood pressure lability.  She had been on Benicar but ultimately this was discontinued during periods of dizziness and low blood pressure.  She has been off this for 2 months.  Her father also had an abdominal aortic aneurysm.  She denies any recurrent anginal symptomatology.  She continues to use her old CPAP  machine with set up date from February 21, 2014 which is no longer wireless.  Over the last 90 days, compliance is excellent with 100% use averaging 7 hours and 45 minutes.  At a set pressure of 15 cm, AHI is 1.0 with her AirSense 10 CPAP (not auto) unit.  During that evaluation, it was recommended that she obtain a new machine and discussed with her the importance of meeting compliance with need for reevaluation within 6 months of receiving a new ResMed AirSense 11 AutoSet unit.  Apparently, Sue Baker never heard from the DME company and as result has not received a new machine.  She has continued to use her old AirSense 10 CPAP unit which is not an auto unit  She typically goes to bed around midnight and wakes up between 6 and 7 AM.  Her last download data on her old machine was from October 15 through August 06, 2023 which showed 100% compliance, averaging 7 hours 36 minutes and at 15 cm set pressure AHI 2.1.  She continues to [facility].  Most recent laboratory showed she had undergone CT of her chest and abdomen on August 29, 2023 which showed normal contour of her thoracic and abdominal aorta without evidence for aneurysm.  There was evidence for hepatomegaly and coarse nodular contour of the liver suggestive of cirrhosis.  There was cholelithiasis as well as uterine fibroids noted.  She was noted to have severe aortic atherosclerosis and was status post median sternotomy and CABG revascularization laboratory on December 20 show total cholesterol 136, HDL 43, LDL 60 with triglycerides elevated at 196.  She presents for evaluation.  Past Medical History:  Diagnosis Date   Allergy    Arthritis    Cataract    Diabetes mellitus without complication (HCC)    GERD (gastroesophageal reflux disease)    Hyperlipidemia    Hypertension    Sleep apnea    Thyroid disease     Past Surgical History:  Procedure Laterality Date   BREAST SURGERY     CARPAL TUNNEL RELEASE      COLONOSCOPY  03/03/2020   CORONARY ARTERY BYPASS GRAFT N/A 10/26/2020   Procedure: CORONARY ARTERY BYPASS GRAFTING (CABG), ON PUMP, TIMES FOUR, USING LEFT INTERNAL MAMMARY ARTERY  AND ENDOSCOPICALLY HARVESTED RIGHT GREATER SAPHENOUS VEIN;  Surgeon: Corliss Skains, MD;  Location: [facility] OR;  Service: Open Heart Surgery;  Laterality: N/A;  flow trac   EYE SURGERY     KNEE SURGERY     left   LEFT HEART CATH AND CORONARY ANGIOGRAPHY N/A 10/25/2020   Procedure: LEFT HEART CATH AND CORONARY ANGIOGRAPHY;  Surgeon: Lennette Bihari, MD;  Location: [facility] INVASIVE CV LAB;  Service: Cardiovascular;  Laterality: N/A;   TEE WITHOUT CARDIOVERSION N/A 10/26/2020   Procedure: TRANSESOPHAGEAL ECHOCARDIOGRAM (TEE);  Surgeon: Corliss Skains, MD;  Location: [facility] OR;  Service: Open Heart Surgery;  Laterality: N/A;   TUBAL LIGATION      Current Medications: Outpatient Medications Prior to Visit  Medication Sig Dispense Refill   aspirin EC 325 MG EC tablet Take 1 tablet (325 mg total) by mouth daily.     blood glucose meter kit and supplies KIT Dispense based on patient and insurance preference. Use up to four times daily as directed. (FOR ICD-10 E11.9) 1 each 0   Cholecalciferol 50 MCG (2000 UT) TABS Take 2,000 Units by mouth daily.     denosumab (PROLIA) 60 MG/ML SOSY injection Inject 60 mg into the skin every 6 (six) months.     escitalopram (LEXAPRO) 10 MG tablet TAKE 1 TABLET BY MOUTH DAILY 90 tablet 3   esomeprazole (NEXIUM 24HR) 20 MG capsule Take 1 capsule (20 mg total) by mouth daily at 12 noon.     ezetimibe (ZETIA) 10 MG tablet Take 1 tablet (10 mg total) by mouth daily. 90 tablet 3   fenofibrate (TRICOR) 145 MG tablet Take 1 tablet (145 mg total) by mouth daily. 90 tablet 1   JARDIANCE 25 MG TABS tablet Take 25 mg by mouth daily.     levothyroxine (SYNTHROID) 150 MCG tablet Take 1 tablet (150 mcg total) by mouth daily before breakfast. TAKE 1 TABLET BY MOUTH DAILY BEFORE BREAKFAST     metFORMIN  (GLUCOPHAGE-XR) 500 MG 24 hr tablet Take 1,000 mg by mouth 2 (two) times daily.     metoprolol succinate (TOPROL XL) 25 MG 24 hr tablet Take 0.5 tablets (12.5 mg total) by mouth 2 (two) times daily.     Multiple Vitamin (MULTI VITAMIN) TABS 1 tablet Orally Once a day     Multiple Vitamins-Minerals (WOMENS MULTI VITAMIN & MINERAL) TABS Take 1 tablet by mouth daily.     olmesartan (BENICAR) 20 MG tablet Take 20 mg by mouth daily.     Omega-3 Fatty Acids (FISH OIL) 1000 MG CAPS Take 2 capsules (2,000 mg total) by mouth 2 (two) times daily.  0   ONETOUCH VERIO test strip USE AS DIRECTED 100 strip 5   Polyethyl Glycol-Propyl Glycol (SYSTANE) 0.4-0.3 % SOLN Place 1-2 drops into both eyes as needed.     Propylene Glycol (SYSTANE COMPLETE) 0.6 % SOLN Place 1 drop into both eyes 4 (four) times daily as needed (dry eyes).     rosuvastatin (CRESTOR) 40 MG tablet Take 1 tablet (40 mg total) by mouth daily. 90 tablet 1   tirzepatide (MOUNJARO) 15 MG/0.5ML Pen Inject 15 mg into the skin once a week.     tiZANidine (ZANAFLEX) 2 MG tablet Take 2 mg by mouth as needed.     traMADol (ULTRAM) 50 MG tablet Take 1 tablet (50 mg total) by mouth every 6 (six) hours as needed for moderate pain. 30 tablet 0   No facility-administered medications prior to visit.  Allergies:   Paroxetine and Trulicity [dulaglutide]   Social History   Socioeconomic History   Marital status: Divorced    Spouse name: Not on file   Number of children: Not on file   Years of education: 14   Highest education level: Some college, no degree  Occupational History    Employer: [employer]  Tobacco Use   Smoking status: Former    Current packs/day: 0.00    Types: Cigarettes    Quit date: 09/30/1999    Years since quitting: 24.1   Smokeless tobacco: Never  Vaping Use   Vaping status: Never Used  Substance and Sexual Activity   Alcohol use: Yes    Comment: rarely   Drug use: No   Sexual activity: Not Currently  Other  Topics Concern   Not on file  Social History Narrative   Lives in a one story home.  Her daughter and grandson live with her.  Works at [employer]. 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  Education: some college.    Social Drivers of Corporate investment banker Strain: Not on file  Food Insecurity: Not on file  Transportation Needs: Not on file  Physical Activity: Not on file  Stress: Not on file  Social Connections: Not on file    Additional social history is notable that she is divorced for 6 years.  She has 2 children and 4 grandchildren.  She had smoked for 15 years but quit smoking in 1998.  She completed 12th grade of education.  Family History:  The patient's family history includes Cancer in her sister; Diabetes in her maternal grandfather; Healthy in her mother; Heart disease in her father and maternal grandfather; Hypertension in her father and maternal grandfather; Peripheral Artery Disease in her father; Thyroid disease in her sister.  Her mother is 40 years old.  Her father is 70 years old and has heart disease dating back to 24.  One sister age 10.  His Hodgkin's disease.  The other sister age 74 is stable.  She has 2 children.  ROS General: Negative; No fevers, chills, or night sweats;  HEENT: Negative; No changes in vision or hearing, sinus congestion, difficulty swallowing Pulmonary: Negative; No cough, wheezing, shortness of breath, hemoptysis Cardiovascular: see HPI GI: Negative; No nausea, vomiting, diarrhea, or abdominal pain GU: Negative; No dysuria, hematuria, or difficulty voiding Musculoskeletal: No back discomfort Hematologic/Oncology: Negative; no easy bruising, bleeding Endocrine: Negative; no heat/cold intolerance; no diabetes Neuro: Right leg numbness Skin: Negative; No rashes or skin lesions Psychiatric: Negative; No behavioral problems, depression Sleep: History of severe obstructive sleep apnea, on CPAP therapy for 20 years.  She received  a new machine in 2015 following the sleep study that was done at the [facility].  Prior history of snoring, daytime sleepiness, nocturia; nobruxism, restless legs, hypnogognic hallucinations, no cataplexy Other comprehensive 14 point system review is negative.   PHYSICAL EXAM:   VS:  BP 112/64 (BP Location: Left Arm, Patient Position: Sitting, Cuff Size: Normal)   Pulse (!) 58   Ht 5\' 4"  (1.626 m)   Wt 188 lb (85.3 kg)   SpO2 94%   BMI 32.27 kg/m     Repeat blood pressure by me was 110/62  Wt Readings from Last 3 Encounters:  11/17/23 188 lb (85.3 kg)  08/07/23 189 lb 12.8 oz (86.1 kg)  02/20/21 181 lb 9.6 oz (82.4 kg)   General: Alert, oriented, no distress.  Skin:  normal turgor, no rashes, warm and dry HEENT: Normocephalic, atraumatic. Pupils equal round and reactive to light; sclera anicteric; extraocular muscles intact; Fundi ** Nose without nasal septal hypertrophy Mouth/Parynx benign; Mallinpatti scale 3 Neck: No JVD, no carotid bruits; normal carotid upstroke Lungs: clear to ausculatation and percussion; no wheezing or rales Chest wall: without tenderness to palpitation Heart: PMI not displaced, RRR, s1 s2 normal, 1/6 systolic murmur, no diastolic murmur, no rubs, gallops, thrills, or heaves Abdomen: soft, nontender; no hepatosplenomehaly, BS+; abdominal aorta nontender and not dilated by palpation. Back: no CVA tenderness Pulses 2+ Musculoskeletal: full range of motion, normal strength, no joint deformities Extremities: no clubbing cyanosis or edema, Homan's sign negative  Neurologic: grossly nonfocal; Cranial nerves grossly wnl Psychologic: Normal mood and affect     Studies/Labs Reviewed:   EKG Interpretation Date/Time:  Monday November 17 2023 08:11:33 EST Ventricular Rate:  58 PR Interval:  126 QRS Duration:  82 QT Interval:  448 QTC Calculation: 439 R Axis:   71  Text Interpretation: Sinus bradycardia with sinus arrhythmia  Nonspecific ST abnormality When compared with ECG of 07-Aug-2023 14:57, No significant change was found Confirmed by Nicki Guadalajara ([id number]) on 11/17/2023 9:20:45 AM    August 07, 2023 ECG (independently read by me): Sinus bradycardia at 51, nonspecific STT abnormality  September 26, 2020 ECG (independently read by me): NSR with mild sinus arryrthmia, QTc 420   July 24, 2020 ECG (independently read by me): NSR at 60 with mild sinus arrythmia   Se[tember  2021ECG (independently read by me): Sinus rhythm at 63 bpm, PAC, normal intervals.  No significant ST changes.  July 2019 ECG (independently read by me): Normal sinus rhythm at 62 bpm.  Jan 28, 2018 ECG (independently read by me): Normal sinus rhythm at 89 bpm.  Nonspecific ST changes.  Normal intervals.  No ectopy.                                Recent Labs:    Latest Ref Rng & Units 08/29/2023    2:04 PM 10/31/2020   12:48 AM 10/29/2020    1:45 AM  BMP  Glucose 70 - 99 mg/dL  409  811   BUN 8 - 23 mg/dL  14  13   Creatinine 9.14 - 1.00 mg/dL 7.82  9.56  2.13   Sodium 135 - 145 mmol/L  138  137   Potassium 3.5 - 5.1 mmol/L  3.8  3.5   Chloride 98 - 111 mmol/L  99  98   CO2 22 - 32 mmol/L  27  30   Calcium 8.9 - 10.3 mg/dL  9.1  8.8         Latest Ref Rng & Units 10/26/2020    5:57 AM 11/19/2019   11:45 AM 11/05/2018   11:00 AM  Hepatic Function  Total Protein 6.5 - 8.1 g/dL 6.1  6.8  7.1   Albumin 3.5 - 5.0 g/dL 3.5  4.3  4.5   AST 15 - 41 U/L 21  43  46   ALT 0 - 44 U/L 22  37  39   Alk Phosphatase 38 - 126 U/L 52  125  83   Total Bilirubin 0.3 - 1.2 mg/dL 1.0  0.6  0.6        Latest Ref Rng & Units 10/29/2020    1:45 AM 10/28/2020    4:34 AM 10/27/2020  4:32 PM  CBC  WBC 4.0 - 10.5 K/uL 9.1  10.4  9.8   Hemoglobin 12.0 - 15.0 g/dL 9.1  9.0  9.1   Hematocrit 36.0 - 46.0 % 27.8  27.4  28.6   Platelets 150 - 400 K/uL 155  158  158    Lab Results  Component Value Date   MCV 98.6 10/29/2020   MCV 100.4 (H) 10/28/2020   MCV  100.7 (H) 10/27/2020   Lab Results  Component Value Date   TSH 0.021 (L) 11/19/2019   Lab Results  Component Value Date   HGBA1C 6.9 (H) 10/25/2020     BNP No results found for: "BNP"  ProBNP No results found for: "PROBNP"   Lipid Panel     Component Value Date/Time   CHOL 123 11/14/2020 1101   TRIG 153 (H) 11/14/2020 1101   HDL 34 (L) 11/14/2020 1101   CHOLHDL 3.6 11/14/2020 1101   CHOLHDL 5.2 (H) 08/14/2016 0921   VLDL 27 08/14/2016 0921   LDLCALC 63 11/14/2020 1101   LDLDIRECT 153 (H) 11/17/2017 1541     RADIOLOGY: No results found.   08/10/2020 CLINICAL INFORMATION Sleep Study Type: HST   Indication for sleep study: reconfirmation of sleep apnea; patient is on CPAP therapy but DME provider ([employer]) needs reconfirmation for her to receive supplies through insurance. Patient has been on CPAP for 20 years and received a new machine in 2015.   Epworth Sleepiness Score: 22   SLEEP STUDY TECHNIQUE A multi-channel overnight portable sleep study was performed. The channels recorded were: nasal airflow, thoracic respiratory movement, and oxygen saturation with a pulse oximetry. Snoring was also monitored.   MEDICATIONS aspirin 81 MG tablet blood glucose meter kit and supplies KIT Continuous Blood Gluc Sensor (FREESTYLE LIBRE 2 SENSOR) MISC cyclobenzaprine (FLEXERIL) 10 MG tablet escitalopram (LEXAPRO) 10 MG tablet esomeprazole (NEXIUM 24HR) 20 MG capsule fenofibrate (TRICOR) 145 MG tablet insulin degludec (TRESIBA FLEXTOUCH) 200 UNIT/ML FlexTouch Pen levothyroxine (SYNTHROID) 150 MCG tablet meloxicam (MOBIC) 15 MG tablet metFORMIN (GLUCOPHAGE-XR) 500 MG 24 hr tablet metoprolol tartrate (LOPRESSOR) 25 MG tablet Multiple Vitamins-Minerals (WOMENS MULTI VITAMIN & MINERAL) TABS olmesartan (BENICAR) 20 MG tablet ONETOUCH VERIO test strip rosuvastatin (CRESTOR) 40 MG tablet traMADol (ULTRAM) 50 MG tablet Patient self administered medications include: N/A.    SLEEP ARCHITECTURE Patient was studied for 454.7 minutes. The sleep efficiency was 100.0 % and the patient was supine for 49.5%. The arousal index was 0.0 per hour.   RESPIRATORY PARAMETERS The overall AHI was 39.1 per hour, with a central apnea index of 0.0 per hour. The severity during REM sleep cannot be assessed on this home study.   The oxygen nadir was 75% during sleep.   CARDIAC DATA Mean heart rate during sleep was 60.1 bpm.   IMPRESSIONS - Severe obstructive sleep apnea occurred during this study (AHI 39.1/h). - No significant central sleep apnea occurred during this study (CAI = 0.0/h). - Severe oxygen desaturation to a nadir of 75%. - Patient snored 4.4% during the sleep.   DIAGNOSIS - Obstructive Sleep Apnea (G47.33) - Nocturnal Hypoxemia (G47.36)   RECOMMENDATIONS - The patient received her last machine in 2015 which currently is set at 15 cm water pressure. If she is in need of a new machine recommend a ResMed AirSence 10 Auto unit. - Positional therapy avoiding supine position during sleep. - Avoid alcohol, sedatives and other CNS depressants that may worsen sleep apnea and disrupt normal sleep architecture. - Sleep hygiene should be  reviewed to assess factors that may improve sleep quality. - Weight management and regular exercise should be initiated or continued. - Return to Sleep Center to discuss the results of this study    Mid RCA lesion is 20% stenosed. Dist RCA lesion is 30% stenosed. Prox RCA-1 lesion is 50% stenosed. Prox RCA-2 lesion is 30% stenosed. Ost LAD to Prox LAD lesion is 60% stenosed. Ramus lesion is 70% stenosed. Ost Cx to Prox Cx lesion is 85% stenosed. Prox LAD to Mid LAD lesion is 30% stenosed. Mid LAD lesion is 40% stenosed. 1st Diag lesion is 80% stenosed. Mid LM to Dist LM lesion is 50% stenosed.   Significant multivessel coronary calcification with 50% distal left main stenosis prior to trifurcating into the LAD, ramus  intermediate and left circumflex vessel. The LAD has 60% focal ostial stenosis and then had diffuse calcification in the proximal to mid segment. The first diagonal vessel 80% stenoses with 30 to 40% mid LAD stenoses; 70% ostial ramus intermediate stenosis; 85% ostial left circumflex stenosis the RCA has 50% eccentric proximal stenosis and mild irregularities of 30%, 20% and 30% in the proximal to mid and mid distal vessel.   Normal LV function with EF 55 to 60%. LVEDP 10 mmHg.    RECOMMENDATION:  Patient with longstanding history of diabetes mellitus for over 20 years, recommend surgical consultation for CABG revascularization surgery. We will initially plan to keep the patient in the hospital so that surgery can see hopefully today. Timing of surgery will be dependent upon surgical schedule.       ASSESSMENT:    1. Coronary artery disease involving native coronary artery of native heart without angina pectoris   2. S/P CABG x 4   3. Primary hypertension   4. OSA (obstructive sleep apnea)   5. Hyperlipidemia LDL goal <55   6. Gastroesophageal reflux disease, unspecified whether esophagitis present   7. Family history of abdominal aortic aneurysm     PLAN:  Sue Baker is a very pleasant 67 year old female who is a long-standing history of hypertension, hyperlipidemia, diabetes mellitus, hypothyroidism, depression, as well as obstructive sleep apnea.  She has been on CPAP therapy for over 20 years and received a new machine in 2015 after undergoing a subsequent sleep study at the [facility].   Unfortunately [facility] had her prior sleep data which was never transferred to [employer].  She was required to undergo a repeat sleep evaluation for documentation of her sleep apnea and this was recently completed on August 10, 2020 again confirming severe sleep apnea with a overall AHI on a home study of 39.1 and significant oxygen desaturation to a nadir of 75%.   She has continued to use her ResMed AirSense 10 CPAP unit with initial set up date in 2015 and had never received the subsequent auto unit.  Her most recent download from August 16 through August 06, 2023 confirms 100% compliance with average usage 7 hours 45 minutes.  At 15 cm set pressure AHI is 1.0.  She qualifies for a new machine and we will schedule her to obtain a new ResMed AirSense 11 auto unit with EPR of 3 and a pressure range of 15 to 20 cm.  Since her last evaluation with me, I performed cardiac catheterization in 2022 which revealed significant multivessel CAD including left main, ostial LAD ramus intermediate and circumflex vessel.  She subsequently underwent successful CABG revascularization surgery by Dr. Cliffton Asters on October 26, 2020.  She has had some issues with blood pressure being elevated leading to resumption of olmesartan for several months and then with blood pressure lowering and dizziness she has not been taking this for the last 3 months.  There also is family history for abdominal aortic aneurysm in her father.  She underwent subsequent echo Doppler study as well as CT of her aorta and abdomen.  Echocardiography performed on September 15, 2023 showed EF 60 to 65% without wall motion abnormalities.  She had normal diastolic parameters and vitals are normal.  CT of her chest aorta and pelvis showed normal contour and caliber of thoracic and abdominal aorta without evidence for aneurysm or dissection.  She had severe aortic atherosclerosis.  She was status post median sternotomy with CABG.  There was hepatomegaly with coarse nodular contour of the liver suggestive of possible cirrhosis.  There was evidence for cholelithiasis and uterine fibroid.  She is continue to use her old CPAP device which is not downloadable since it is [employer], international aid and not the upgraded 5G.  She never heard from the DME company.  We will contact them today and expedite that she received a new ResMed AirSense 11  AutoSet unit since her current device is 66 years old.  I discussed with her my plans for retirement later this year.  I will need to see her in May in follow-up of her receiving a new CPAP device she continues to be on Jardiance, metformin, and is now on Mounjaro with her diabetes.  She apparently has been taking aspirin 325 mg and I recommended she to reduce this to 81 mg.  She continues to be on olmesartan 20 mg and take Zetia, fenofibrate, and omega-3 fatty acids..    Medication Adjustments/Labs and Tests Ordered: Current medicines are reviewed at length with the patient today.  Concerns regarding medicines are outlined above.  Medication changes, Labs and Tests ordered today are listed in the Patient Instructions below. Patient Instructions  Medication Instructions:  No medication changes were made during today's visit  *If you need a refill on your cardiac medications before your next appointment, please call your pharmacy*   Lab Work: No labs were ordered during today's visit.  If you have labs (blood work) drawn today and your tests are completely normal, you will receive your results only by: MyChart Message (if you have MyChart) OR A paper copy in the mail If you have any lab test that is abnormal or we need to change your treatment, we will call you to review the results.   Testing/Procedures: No procedures ordered today.    Follow-Up: At [facility], you and your health needs are our priority.  As part of our continuing mission to provide you with exceptional heart care, we have created designated Provider Care Teams.  These Care Teams include your primary Cardiologist (physician) and Advanced Practice Providers (APPs -  Physician Assistants and Nurse Practitioners) who all work together to provide you with the care you need, when you need it.  We recommend signing up for the patient portal called "MyChart".  Sign up information is provided on this After Visit Summary.   MyChart is used to connect with patients for Virtual Visits (Telemedicine).  Patients are able to view lab/test results, encounter notes, upcoming appointments, etc.  Non-urgent messages can be sent to your provider as well.   To learn more about what you can do with MyChart, go to [email or web address].    Your next appointment:  3 month(s)  Provider:   Nicki Guadalajara, MD     Other Instructions Thank you for choosing  [facility]!            1st Floor: - Lobby - Registration  - Pharmacy  - Lab - Cafe   2nd Floor: - PV Lab - Diagnostic Testing (echo, CT, nuclear med)   3rd Floor: - Vacant   4th Floor: - TCTS (cardiothoracic surgery) - AFib Clinic - Structural Heart Clinic - Vascular Surgery  - Vascular Ultrasound   5th Floor: - [facility] Cardiology (general and EP) - Clinical Pharmacy for coumadin, hypertension, lipid, weight-loss medications, and med management appointments      Valet parking services will be available as well.           Signed, Nicki Guadalajara, MD  11/19/2023 5:39 PM    [facility] [address], [place], [place]  [address] Phone: [phone]

## 2023-11-17 NOTE — Patient Instructions (Signed)
 Medication Instructions:  No medication changes were made during today's visit  *If you need a refill on your cardiac medications before your next appointment, please call your pharmacy*   Lab Work: No labs were ordered during today's visit.  If you have labs (blood work) drawn today and your tests are completely normal, you will receive your results only by: MyChart Message (if you have MyChart) OR A paper copy in the mail If you have any lab test that is abnormal or we need to change your treatment, we will call you to review the results.   Testing/Procedures: No procedures ordered today.    Follow-Up: At [facility], you and your health needs are our priority.  As part of our continuing mission to provide you with exceptional heart care, we have created designated Provider Care Teams.  These Care Teams include your primary Cardiologist (physician) and Advanced Practice Providers (APPs -  Physician Assistants and Nurse Practitioners) who all work together to provide you with the care you need, when you need it.  We recommend signing up for the patient portal called "MyChart".  Sign up information is provided on this After Visit Summary.  MyChart is used to connect with patients for Virtual Visits (Telemedicine).  Patients are able to view lab/test results, encounter notes, upcoming appointments, etc.  Non-urgent messages can be sent to your provider as well.   To learn more about what you can do with MyChart, go to [email or web address].    Your next appointment:   3 month(s)  Provider:   Nicki Guadalajara, MD     Other Instructions Thank you for choosing [facility]!            1st Floor: - Lobby - Registration  - Pharmacy  - Lab - Cafe   2nd Floor: - PV Lab - Diagnostic Testing (echo, CT, nuclear med)   3rd Floor: - Vacant   4th Floor: - TCTS (cardiothoracic surgery) - AFib Clinic - Structural Heart Clinic - Vascular Surgery  - Vascular  Ultrasound   5th Floor: - [facility] Cardiology (general and EP) - Clinical Pharmacy for coumadin, hypertension, lipid, weight-loss medications, and med management appointments      Valet parking services will be available as well.

## 2023-11-19 ENCOUNTER — Encounter: Payer: Self-pay | Admitting: Cardiovascular Disease

## 2023-11-21 ENCOUNTER — Telehealth: Payer: Self-pay

## 2023-11-21 NOTE — Telephone Encounter (Signed)
 Patient brought in PAP device with card. Download performed and sent to Dr. Tresa Endo

## 2023-11-25 DIAGNOSIS — E1165 Type 2 diabetes mellitus with hyperglycemia: Secondary | ICD-10-CM | POA: Diagnosis not present

## 2023-11-25 DIAGNOSIS — E039 Hypothyroidism, unspecified: Secondary | ICD-10-CM | POA: Diagnosis not present

## 2023-12-02 DIAGNOSIS — M810 Age-related osteoporosis without current pathological fracture: Secondary | ICD-10-CM | POA: Diagnosis not present

## 2023-12-02 DIAGNOSIS — E1165 Type 2 diabetes mellitus with hyperglycemia: Secondary | ICD-10-CM | POA: Diagnosis not present

## 2023-12-02 DIAGNOSIS — I10 Essential (primary) hypertension: Secondary | ICD-10-CM | POA: Diagnosis not present

## 2023-12-02 DIAGNOSIS — E785 Hyperlipidemia, unspecified: Secondary | ICD-10-CM | POA: Diagnosis not present

## 2023-12-02 DIAGNOSIS — E039 Hypothyroidism, unspecified: Secondary | ICD-10-CM | POA: Diagnosis not present

## 2023-12-09 ENCOUNTER — Encounter: Payer: Self-pay | Admitting: Cardiovascular Disease

## 2023-12-09 DIAGNOSIS — G4733 Obstructive sleep apnea (adult) (pediatric): Secondary | ICD-10-CM

## 2023-12-09 NOTE — Telephone Encounter (Signed)
 Per Dr Tresa Endo  expedite that she received a new ResMed AirSense 11 AutoSet unit. Order has been placed to [facility] via community message.

## 2023-12-09 NOTE — Telephone Encounter (Signed)
 Per Dr Tresa Endo, expedite that she received a new ResMed AirSense 11 AutoSet unit. Order sent to [facility] via community message.

## 2023-12-17 DIAGNOSIS — E039 Hypothyroidism, unspecified: Secondary | ICD-10-CM | POA: Diagnosis not present

## 2023-12-24 DIAGNOSIS — G4733 Obstructive sleep apnea (adult) (pediatric): Secondary | ICD-10-CM | POA: Diagnosis not present

## 2024-01-23 DIAGNOSIS — G4733 Obstructive sleep apnea (adult) (pediatric): Secondary | ICD-10-CM | POA: Diagnosis not present

## 2024-02-23 DIAGNOSIS — G4733 Obstructive sleep apnea (adult) (pediatric): Secondary | ICD-10-CM | POA: Diagnosis not present

## 2024-03-11 ENCOUNTER — Ambulatory Visit: Payer: PPO | Attending: Cardiovascular Disease | Admitting: Cardiovascular Disease

## 2024-03-11 ENCOUNTER — Encounter: Payer: Self-pay | Admitting: Cardiovascular Disease

## 2024-03-11 VITALS — BP 126/70 | HR 62 | Ht 64.0 in | Wt 194.2 lb

## 2024-03-11 DIAGNOSIS — E785 Hyperlipidemia, unspecified: Secondary | ICD-10-CM

## 2024-03-11 DIAGNOSIS — Z8249 Family history of ischemic heart disease and other diseases of the circulatory system: Secondary | ICD-10-CM | POA: Diagnosis not present

## 2024-03-11 DIAGNOSIS — Z951 Presence of aortocoronary bypass graft: Secondary | ICD-10-CM

## 2024-03-11 DIAGNOSIS — I2510 Atherosclerotic heart disease of native coronary artery without angina pectoris: Secondary | ICD-10-CM | POA: Diagnosis not present

## 2024-03-11 DIAGNOSIS — I10 Essential (primary) hypertension: Secondary | ICD-10-CM

## 2024-03-11 DIAGNOSIS — G4733 Obstructive sleep apnea (adult) (pediatric): Secondary | ICD-10-CM

## 2024-03-11 DIAGNOSIS — K219 Gastro-esophageal reflux disease without esophagitis: Secondary | ICD-10-CM | POA: Diagnosis not present

## 2024-03-11 NOTE — Patient Instructions (Signed)
 Medication Instructions:  Your physician recommends that you continue on your current medications as directed. Please refer to the Current Medication list given to you today.  *If you need a refill on your cardiac medications before your next appointment, please call your pharmacy*  Lab Work: None ordered.  If you have labs (blood work) drawn today and your tests are completely normal, you will receive your results only by: MyChart Message (if you have MyChart) OR A paper copy in the mail If you have any lab test that is abnormal or we need to change your treatment, we will call you to review the results.  Testing/Procedures: None ordered.   Follow-Up: At [facility], you and your health needs are our priority.  As part of our continuing mission to provide you with exceptional heart care, our providers are all part of one team.  This team includes your primary Cardiologist (physician) and Advanced Practice Providers or APPs (Physician Assistants and Nurse Practitioners) who all work together to provide you with the care you need, when you need it.  Your next appointment:   6 months with Dr Addie Holstein  Follow up as needed with Dr Micael Adas for sleep

## 2024-03-11 NOTE — Progress Notes (Signed)
 Cardiology Office Note    Date:  03/11/2024   ID:  Sue Baker, DOB November 28, 1955, MRN [id number]  PCP:  Rae Bugler, MD  Cardiologist:  Magnus Schuller, MD   4 month F/U cardiology/sleep evaluation, initially referred through the courtesy of Dr. Leni Quiet  History of Present Illness:  Sue Baker is a 68 y.o. female who presents to the office today for a 3 month f/u office cardiology/sleep evaluation.    Sue Baker is originally from [place]  and moved here with [employer] when she was transferred to the [place] area.  She has a long-standing history of hypertension, diabetes mellitus for at least 6 years, hyperlipidemia, and has a diagnosis of obstructive sleep apnea for almost 20 years.  She received a new CPAP machine in 2015.  She continues to work.  She has had difficulty with right leg numbness and also has noticed some shortness of breath with activity.  She denies any definitive lower extremity pain with walking but admits to numbness.   She has difficulty with knee discomfort.  She has noticed episodes of some stress mediated chest pressure but denies any clear-cut exertional precipitation.  Her last stress test was in [place], [place]  at least 8 years ago.    She established care with me in May 2019 and I saw her for follow-up evaluation on March 23, 2008.  She underwent an echo Doppler study on Feb 11, 2018 which essentially was normal.  EF was 60 to 65%.  She had normal diastolic parameters.  There was no significant valvular abnormalities.  She has had issues with back pain and has seen a neurologist and is felt to have symptoms related to L5.  She denies any chest pain.  She is on CPAP for obstructive sleep apnea and uses [facility].    She has had some issues with varicose veins.  She underwent lower extremity arterial Doppler and had normal bilateral ankle-brachial indices.    Since I last evaluated her, she has continued to use CPAP therapy.  Her previous DME company  was [employer] which was purchased by [employer].  She is in need for new supplies.  She contacted [employer] who is now her DME provider but they have no records of her sleep apnea history.  For this reason she presented to the office today so that we can provide information to [employer] and ultimately have her link to our office for care.  She continues to work 12-hour shifts for [employer].  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  Over the past 4 to 5 months she has noticed shortness of breath with activity but denies associated chest tightness..  She is on olmesartan  20 mg for hypertension and is on rosuvastatin  40 mg and fenofibrate  for hyperlipidemia.  She is on combination therapy with saxagliptin  and Metformin  for her diabetes mellitus and takes Lexapro  for mild depression.    I had seen her in 2015 and at that time she told me that she was diagnosed with sleep apnea approximately 15 years prior and recalled that she stopped breathing approximately 1 time per minute which correlates to an AHI of approximately 60.  She had been on CPAP therapy until 2015.  When I saw her, her CPAP machine had just broken and was nonfunctional.  She was unable to sleep without CPAP in her sleep quality was poor and she was snoring excessively.  Apparently she underwent a sleep evaluation at the [facility] and received a new ResMed air sense 10 machine.  I tried to obtain records of her sleep study at [facility] heart sleep center but this was not in the epic data since this was done prior to us  being a part of [facility].  However since obtaining a new machine she admits to 100% compliance.  When I saw her in September 2021, she was in need for new supplies.  Her prior DME company was bought out by [employer] and she was unable to obtain supplies from them.  She had broken her right leg in October 2020 and was incapacitated for several months resulting in some weight gain.  She denied associated chest  pain but admitted to some shortness of breath with activity.  I recommended she undergo an echo Doppler study to reassess LV systolic and diastolic function.  I also recommended she undergo routine treadmill test to assess exercise capacity and make certain there was no suggestion of potential ischemic changes.  We were trying to have [employer] link her CPAP device to our office and I recommended follow-up in several months.  She underwent her echo Doppler study in June 26, 2020 which revealed normal systolic function with EF at 60 to 65% without wall motion abnormalities.  She had normal diastolic parameters.  There was mild dilation of the ascending aorta at 39 mm.  Her left atrium was moderately dilated.  A routine treadmill test was done on July 07, 2020.  She had a normal blood pressure response to exercise.  She was asymptomatic throughout the test but developed mild ST segment depression in the inferolateral leads of approximately 2 to 3 mm.  Presently she feels well and denies chest pain or shortness of breath.    When I saw her on July 24, 2020 we had to schedule her for a home sleep study by her DME company since they do not have any of her prior data and needed this so that she could obtain supplies.  In addition, because of her abnormal treadmill test I recommended she undergo coronary CTA for further evaluation of potential coronary artery disease.  She underwent a home sleep study on August 11, 2020 which confirmed severe sleep apnea with an AHI of 39.1.  O2 nadir was significant at 75% during sleep.  She has not yet been set up with her ResMed air sense 10 AutoSet unit.  I for age and gender suggesting a high risk for future cardiac events.  She was felt to have possible 50% distal left main stenosis.  There was heavy calcification of the proximal and mid LAD but severe stenosis cannot be excluded due to blooming artifact from the extensive calcification making quantification difficult.   He also has heavy calcification of the proximal and mid ramus with possible moderate stenosis.  He subsequently underwent FFR analysis with FFR significant at 0.73 in the distal LAD.  The left main did not appear to be hemodynamically significant.  Ramus FFR could not be analyzed.  She admits to exertional chest tightness as well as dyspnea.  She continues to work in the yard intermittently.  I performed cardiac catheterization on October 25, 2020 which showed significant multivessel coronary calcification with 50% distal left main stenosis prior to trifurcating into LAD ramus and left circumflex.  The LAD had 60% focal ostial stenosis and had diffuse calcification in the proximal to mid segment.  The first diagonal vessel at 80% stenosis with 30 to 40% mid  LAD stenoses.  There was 70% ostial ramus immediate stenosis, 85% ostial left circumflex stenosis in the RCA had eccentric 50% proximal stenosis with mild irregularities in the proximal to mid and mid distal vessel.  With her longstanding history of diabetes mellitus surgical revascularization was recommended.  She underwent successful CABG surgery x 4 in October 26, 2020 by Dr. Deloise Ferries with a LIMA to LAD, SVG to OM 3, SVG to ramus, and SVG to first diagonal.  She has subsequently been seen in the office by Lawana Pray.  She continues to see Dr. Felizardo Hotter for primary care.  I saw her on August 07, 2023.  She was having some issues with blood pressure lability.  She had been on Benicar  but ultimately this was discontinued during periods of dizziness and low blood pressure.  She has been off this for 2 months.  Her father also had an abdominal aortic aneurysm.  She denies any recurrent anginal symptomatology.  She continues to use her old CPAP machine with set up date from February 21, 2014 which is no longer wireless.  Over the last 90 days, compliance is excellent with 100% use averaging 7 hours and 45 minutes.  At a set pressure of 15 cm, AHI is 1.0  with her AirSense 10 CPAP (not auto) unit.  During that evaluation, it was recommended that she obtain a new machine and discussed with her the importance of meeting compliance with need for reevaluation within 6 months of receiving a new ResMed AirSense 11 AutoSet unit.  I last saw her on November 17, 2023.  Sue. Zylka never heard from the DME company and as result has not received a new machine.  She has continued to use her old AirSense 10 CPAP unit which is not an auto unit  She typically goes to bed around midnight and wakes up between 6 and 7 AM.  Her last download data on her old machine was from October 15 through August 06, 2023 which showed 100% compliance, averaging 7 hours 36 minutes and at 15 cm set pressure AHI 2.1.  She continues to see Dr. Felizardo Hotter at [facility] for primary care.  Most recent laboratory showed she had undergone CT of her chest and abdomen on August 29, 2023 which showed normal contour of her thoracic and abdominal aorta without evidence for aneurysm.  There was evidence for hepatomegaly and coarse nodular contour of the liver suggestive of cirrhosis.  There was cholelithiasis as well as uterine fibroids noted.  She was noted to have severe aortic atherosclerosis and was status post median sternotomy and CABG revascularization laboratory on December 20 show total cholesterol 136, HDL 43, LDL 60 with triglycerides elevated at 196.  During that evaluation I told her we will try to expedite her receiving a new ResMed AirSense 11 AutoSet unit.  She was taking aspirin  325 mg a night recommended she reduce this to 20 mg daily.  She was now participating in a trial using Mounjaro for diabetes.    Past Medical History:  Diagnosis Date   Allergy    Arthritis    Cataract    Diabetes mellitus without complication (HCC)    GERD (gastroesophageal reflux disease)    Hyperlipidemia    Hypertension    Sleep apnea    Thyroid  disease     Past Surgical History:  Procedure Laterality  Date   BREAST SURGERY     CARPAL TUNNEL RELEASE     COLONOSCOPY  03/03/2020   CORONARY ARTERY  BYPASS GRAFT N/A 10/26/2020   Procedure: CORONARY ARTERY BYPASS GRAFTING (CABG), ON PUMP, TIMES FOUR, USING LEFT INTERNAL MAMMARY ARTERY AND ENDOSCOPICALLY HARVESTED RIGHT GREATER SAPHENOUS VEIN;  Surgeon: Hilarie Lovely, MD;  Location: [facility] OR;  Service: Open Heart Surgery;  Laterality: N/A;  flow trac   EYE SURGERY     KNEE SURGERY     left   LEFT HEART CATH AND CORONARY ANGIOGRAPHY N/A 10/25/2020   Procedure: LEFT HEART CATH AND CORONARY ANGIOGRAPHY;  Surgeon: Millicent Ally, MD;  Location: [facility] INVASIVE CV LAB;  Service: Cardiovascular;  Laterality: N/A;   TEE WITHOUT CARDIOVERSION N/A 10/26/2020   Procedure: TRANSESOPHAGEAL ECHOCARDIOGRAM (TEE);  Surgeon: Hilarie Lovely, MD;  Location: [facility] OR;  Service: Open Heart Surgery;  Laterality: N/A;   TUBAL LIGATION      Current Medications: Outpatient Medications Prior to Visit  Medication Sig Dispense Refill   aspirin  EC 325 MG EC tablet Take 1 tablet (325 mg total) by mouth daily.     blood glucose meter kit and supplies KIT Dispense based on patient and insurance preference. Use up to four times daily as directed. (FOR ICD-10 E11.9) 1 each 0   Cholecalciferol 50 MCG (2000 UT) TABS Take 2,000 Units by mouth daily.     denosumab (PROLIA) 60 MG/ML SOSY injection Inject 60 mg into the skin every 6 (six) months.     escitalopram  (LEXAPRO ) 10 MG tablet TAKE 1 TABLET BY MOUTH DAILY 90 tablet 3   esomeprazole  (NEXIUM  24HR) 20 MG capsule Take 1 capsule (20 mg total) by mouth daily at 12 noon.     ezetimibe  (ZETIA ) 10 MG tablet Take 1 tablet (10 mg total) by mouth daily. 90 tablet 3   fenofibrate  (TRICOR ) 145 MG tablet Take 1 tablet (145 mg total) by mouth daily. 90 tablet 1   JARDIANCE 25 MG TABS tablet Take 25 mg by mouth daily.     levothyroxine  (SYNTHROID ) 150 MCG tablet Take 1 tablet (150 mcg total) by mouth daily before breakfast. TAKE 1 TABLET BY  MOUTH DAILY BEFORE BREAKFAST     metFORMIN  (GLUCOPHAGE -XR) 500 MG 24 hr tablet Take 1,000 mg by mouth 2 (two) times daily.     metoprolol  succinate (TOPROL  XL) 25 MG 24 hr tablet Take 0.5 tablets (12.5 mg total) by mouth 2 (two) times daily.     Multiple Vitamin (MULTI VITAMIN) TABS 1 tablet Orally Once a day     Multiple Vitamins-Minerals (WOMENS MULTI VITAMIN & MINERAL) TABS Take 1 tablet by mouth daily.     olmesartan  (BENICAR ) 20 MG tablet Take 20 mg by mouth daily.     Omega-3 Fatty Acids (FISH OIL ) 1000 MG CAPS Take 2 capsules (2,000 mg total) by mouth 2 (two) times daily.  0   ONETOUCH VERIO test strip USE AS DIRECTED 100 strip 5   Polyethyl Glycol-Propyl Glycol (SYSTANE) 0.4-0.3 % SOLN Place 1-2 drops into both eyes as needed.     Propylene Glycol (SYSTANE COMPLETE) 0.6 % SOLN Place 1 drop into both eyes 4 (four) times daily as needed (dry eyes).     rosuvastatin  (CRESTOR ) 40 MG tablet Take 1 tablet (40 mg total) by mouth daily. 90 tablet 1   tirzepatide (MOUNJARO) 15 MG/0.5ML Pen Inject 15 mg into the skin once a week.     tiZANidine (ZANAFLEX) 2 MG tablet Take 2 mg by mouth as needed.     traMADol  (ULTRAM ) 50 MG tablet Take 1 tablet (50 mg total) by mouth every  6 (six) hours as needed for moderate pain. 30 tablet 0   No facility-administered medications prior to visit.     Allergies:   Paroxetine and Trulicity  [dulaglutide ]   Social History   Socioeconomic History   Marital status: Divorced    Spouse name: Not on file   Number of children: Not on file   Years of education: 14   Highest education level: Some college, no degree  Occupational History    Employer: [employer]  Tobacco Use   Smoking status: Former    Current packs/day: 0.00    Types: Cigarettes    Quit date: 09/30/1999    Years since quitting: 24.4   Smokeless tobacco: Never  Vaping Use   Vaping status: Never Used  Substance and Sexual Activity   Alcohol use: Yes    Comment: rarely   Drug use: No    Sexual activity: Not Currently  Other Topics Concern   Not on file  Social History Narrative   Lives in a one story home.  Her daughter and grandson live with her.  Works at [employer]. 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  Education: some college.    Social Drivers of Corporate investment banker Strain: Not on file  Food Insecurity: Not on file  Transportation Needs: Not on file  Physical Activity: Not on file  Stress: Not on file  Social Connections: Not on file    Additional social history is notable that she is divorced for 6 years.  She has 2 children and 4 grandchildren.  She had smoked for 15 years but quit smoking in 1998.  She completed 12th grade of education.  Family History:  The patient's family history includes Cancer in her sister; Diabetes in her maternal grandfather; Healthy in her mother; Heart disease in her father and maternal grandfather; Hypertension in her father and maternal grandfather; Peripheral Artery Disease in her father; Thyroid  disease in her sister.  Her mother is 17 years old.  Her father is 81 years old and has heart disease dating back to 8.  One sister age 47.  His Hodgkin's disease.  The other sister age 76 is stable.  She has 2 children.  ROS General: Negative; No fevers, chills, or night sweats;  HEENT: Negative; No changes in vision or hearing, sinus congestion, difficulty swallowing Pulmonary: Negative; No cough, wheezing, shortness of breath, hemoptysis Cardiovascular: see HPI GI: Negative; No nausea, vomiting, diarrhea, or abdominal pain GU: Negative; No dysuria, hematuria, or difficulty voiding Musculoskeletal: No back discomfort Hematologic/Oncology: Negative; no easy bruising, bleeding Endocrine: Negative; no heat/cold intolerance; no diabetes Neuro: Right leg numbness Skin: Negative; No rashes or skin lesions Psychiatric: Negative; No behavioral problems, depression Sleep: History of severe obstructive sleep apnea,  on CPAP therapy for 20 years.  She received a new machine in 2015 following the sleep study that was done at the [facility].  Prior history of snoring, daytime sleepiness, nocturia; nobruxism, restless legs, hypnogognic hallucinations, no cataplexy Other comprehensive 14 point system review is negative.   PHYSICAL EXAM:   VS:  BP 126/70   Pulse 62   Ht 5' 4 (1.626 m)   Wt 194 lb 3.2 oz (88.1 kg)   SpO2 95%   BMI 33.33 kg/m     Repeat blood pressure by me was 130/64  Wt Readings from Last 3 Encounters:  03/11/24 194 lb 3.2 oz (88.1 kg)  11/17/23 188 lb (85.3 kg)  08/07/23 189  lb 12.8 oz (86.1 kg)   General: Alert, oriented, no distress.  Skin: normal turgor, no rashes, warm and dry HEENT: Normocephalic, atraumatic. Pupils equal round and reactive to light; sclera anicteric; extraocular muscles intact;  Nose without nasal septal hypertrophy Mouth/Parynx benign; Mallinpatti scale 3 Neck: No JVD, no carotid bruits; normal carotid upstroke Lungs: clear to ausculatation and percussion; no wheezing or rales Chest wall: without tenderness to palpitation Heart: PMI not displaced, RRR, s1 s2 normal, 1/6 systolic murmur, no diastolic murmur, no rubs, gallops, thrills, or heaves Abdomen: soft, nontender; no hepatosplenomehaly, BS+; abdominal aorta nontender and not dilated by palpation. Back: no CVA tenderness Pulses 2+ Musculoskeletal: full range of motion, normal strength, no joint deformities Extremities: no clubbing cyanosis or edema, Homan's sign negative  Neurologic: grossly nonfocal; Cranial nerves grossly wnl Psychologic: Normal mood and affect    Studies/Labs Reviewed:   EKG Interpretation Date/Time:  Thursday March 11 2024 14:35:42 EDT Ventricular Rate:  57 PR Interval:  116 QRS Duration:  82 QT Interval:  446 QTC Calculation: 434 R Axis:   73  Text Interpretation: Sinus bradycardia with sinus arrhythmia ST & T wave abnormality, consider inferior  ischemia When compared with ECG of 17-Nov-2023 08:11, Inverted T waves have replaced nonspecific T wave abnormality in Lateral leads Confirmed by Magnus Schuller ([id number]) on 03/11/2024 4:46:47 PM    November 17, 2023 ECG (independently read by me): Sinus bradycardia with sinus arrhythmia.  Nonspecific ST abnormality  August 07, 2023 ECG (independently read by me): Sinus bradycardia at 51, nonspecific STT abnormality  September 26, 2020 ECG (independently read by me): NSR with mild sinus arryrthmia, QTc 420   July 24, 2020 ECG (independently read by me): NSR at 60 with mild sinus arrythmia   Se[tember  2021ECG (independently read by me): Sinus rhythm at 63 bpm, PAC, normal intervals.  No significant ST changes.  July 2019 ECG (independently read by me): Normal sinus rhythm at 62 bpm.  Jan 28, 2018 ECG (independently read by me): Normal sinus rhythm at 89 bpm.  Nonspecific ST changes.  Normal intervals.  No ectopy.                                Recent Labs:    Latest Ref Rng & Units 08/29/2023    2:04 PM 10/31/2020   12:48 AM 10/29/2020    1:45 AM  BMP  Glucose 70 - 99 mg/dL  981  191   BUN 8 - 23 mg/dL  14  13   Creatinine 4.78 - 1.00 mg/dL 2.95  6.21  3.08   Sodium 135 - 145 mmol/L  138  137   Potassium 3.5 - 5.1 mmol/L  3.8  3.5   Chloride 98 - 111 mmol/L  99  98   CO2 22 - 32 mmol/L  27  30   Calcium  8.9 - 10.3 mg/dL  9.1  8.8         Latest Ref Rng & Units 10/26/2020    5:57 AM 11/19/2019   11:45 AM 11/05/2018   11:00 AM  Hepatic Function  Total Protein 6.5 - 8.1 g/dL 6.1  6.8  7.1   Albumin  3.5 - 5.0 g/dL 3.5  4.3  4.5   AST 15 - 41 U/L 21  43  46   ALT 0 - 44 U/L 22  37  39   Alk Phosphatase 38 - 126 U/L 52  125  83   Total Bilirubin 0.3 - 1.2 mg/dL 1.0  0.6  0.6        Latest Ref Rng & Units 10/29/2020    1:45 AM 10/28/2020    4:34 AM 10/27/2020    4:32 PM  CBC  WBC 4.0 - 10.5 K/uL 9.1  10.4  9.8   Hemoglobin 12.0 - 15.0 g/dL 9.1  9.0  9.1   Hematocrit 36.0 - 46.0 % 27.8   27.4  28.6   Platelets 150 - 400 K/uL 155  158  158    Lab Results  Component Value Date   MCV 98.6 10/29/2020   MCV 100.4 (H) 10/28/2020   MCV 100.7 (H) 10/27/2020   Lab Results  Component Value Date   TSH 0.021 (L) 11/19/2019   Lab Results  Component Value Date   HGBA1C 6.9 (H) 10/25/2020     BNP No results found for: BNP  ProBNP No results found for: PROBNP   Lipid Panel     Component Value Date/Time   CHOL 123 11/14/2020 1101   TRIG 153 (H) 11/14/2020 1101   HDL 34 (L) 11/14/2020 1101   CHOLHDL 3.6 11/14/2020 1101   CHOLHDL 5.2 (H) 08/14/2016 0921   VLDL 27 08/14/2016 0921   LDLCALC 63 11/14/2020 1101   LDLDIRECT 153 (H) 11/17/2017 1541     RADIOLOGY: No results found.   08/10/2020 CLINICAL INFORMATION Sleep Study Type: HST   Indication for sleep study: reconfirmation of sleep apnea; patient is on CPAP therapy but DME provider ([employer]) needs reconfirmation for her to receive supplies through insurance. Patient has been on CPAP for 20 years and received a new machine in 2015.   Epworth Sleepiness Score: 22   SLEEP STUDY TECHNIQUE A multi-channel overnight portable sleep study was performed. The channels recorded were: nasal airflow, thoracic respiratory movement, and oxygen saturation with a pulse oximetry. Snoring was also monitored.   MEDICATIONS aspirin  81 MG tablet blood glucose meter kit and supplies KIT Continuous Blood Gluc Sensor (FREESTYLE LIBRE 2 SENSOR) MISC cyclobenzaprine  (FLEXERIL ) 10 MG tablet escitalopram  (LEXAPRO ) 10 MG tablet esomeprazole  (NEXIUM  24HR) 20 MG capsule fenofibrate  (TRICOR ) 145 MG tablet insulin  degludec (TRESIBA FLEXTOUCH) 200 UNIT/ML FlexTouch Pen levothyroxine  (SYNTHROID ) 150 MCG tablet meloxicam  (MOBIC ) 15 MG tablet metFORMIN  (GLUCOPHAGE -XR) 500 MG 24 hr tablet metoprolol  tartrate (LOPRESSOR ) 25 MG tablet Multiple Vitamins-Minerals (WOMENS MULTI VITAMIN & MINERAL) TABS olmesartan  (BENICAR ) 20 MG  tablet ONETOUCH VERIO test strip rosuvastatin  (CRESTOR ) 40 MG tablet traMADol  (ULTRAM ) 50 MG tablet Patient self administered medications include: N/A.   SLEEP ARCHITECTURE Patient was studied for 454.7 minutes. The sleep efficiency was 100.0 % and the patient was supine for 49.5%. The arousal index was 0.0 per hour.   RESPIRATORY PARAMETERS The overall AHI was 39.1 per hour, with a central apnea index of 0.0 per hour. The severity during REM sleep cannot be assessed on this home study.   The oxygen nadir was 75% during sleep.   CARDIAC DATA Mean heart rate during sleep was 60.1 bpm.   IMPRESSIONS - Severe obstructive sleep apnea occurred during this study (AHI 39.1/h). - No significant central sleep apnea occurred during this study (CAI = 0.0/h). - Severe oxygen desaturation to a nadir of 75%. - Patient snored 4.4% during the sleep.   DIAGNOSIS - Obstructive Sleep Apnea (G47.33) - Nocturnal Hypoxemia (G47.36)   RECOMMENDATIONS - The patient received her last machine in 2015 which currently is set at 15 cm water pressure. If she is in need  of a new machine recommend a ResMed AirSence 10 Auto unit. - Positional therapy avoiding supine position during sleep. - Avoid alcohol, sedatives and other CNS depressants that may worsen sleep apnea and disrupt normal sleep architecture. - Sleep hygiene should be reviewed to assess factors that may improve sleep quality. - Weight management and regular exercise should be initiated or continued. - Return to Sleep Center to discuss the results of this study    Mid RCA lesion is 20% stenosed. Dist RCA lesion is 30% stenosed. Prox RCA-1 lesion is 50% stenosed. Prox RCA-2 lesion is 30% stenosed. Ost LAD to Prox LAD lesion is 60% stenosed. Ramus lesion is 70% stenosed. Ost Cx to Prox Cx lesion is 85% stenosed. Prox LAD to Mid LAD lesion is 30% stenosed. Mid LAD lesion is 40% stenosed. 1st Diag lesion is 80% stenosed. Mid LM to Dist LM  lesion is 50% stenosed.   Significant multivessel coronary calcification with 50% distal left main stenosis prior to trifurcating into the LAD, ramus intermediate and left circumflex vessel. The LAD has 60% focal ostial stenosis and then had diffuse calcification in the proximal to mid segment. The first diagonal vessel 80% stenoses with 30 to 40% mid LAD stenoses; 70% ostial ramus intermediate stenosis; 85% ostial left circumflex stenosis the RCA has 50% eccentric proximal stenosis and mild irregularities of 30%, 20% and 30% in the proximal to mid and mid distal vessel.   Normal LV function with EF 55 to 60%. LVEDP 10 mmHg.    RECOMMENDATION:  Patient with longstanding history of diabetes mellitus for over 20 years, recommend surgical consultation for CABG revascularization surgery. We will initially plan to keep the patient in the hospital so that surgery can see hopefully today. Timing of surgery will be dependent upon surgical schedule.       ASSESSMENT:    1. Coronary artery disease involving native coronary artery of native heart without angina pectoris   2. S/P CABG x 4   3. Primary hypertension   4. OSA (obstructive sleep apnea)   5. Hyperlipidemia LDL goal <55   6. Gastroesophageal reflux disease, unspecified whether esophagitis present   7. Family history of abdominal aortic aneurysm      PLAN:  Sue. Shatasha Lambing is a very pleasant 68 year old female who is a long-standing history of hypertension, hyperlipidemia, diabetes mellitus, hypothyroidism, depression, as well as obstructive sleep apnea.  She has been on CPAP therapy for over 20 years and received a new machine in 2015 after undergoing a subsequent sleep study at the [facility].  Unfortunately [facility] had her prior sleep data which was never transferred to [facility].  She was required to undergo a repeat sleep evaluation for documentation of her sleep apnea and this was recently completed on  August 10, 2020 again confirming severe sleep apnea with a overall AHI on a home study of 39.1 and significant oxygen desaturation to a nadir of 75%.  She has continued to use her ResMed AirSense 10 CPAP unit with initial set up date in 2015 and had never received the subsequent auto unit.  Her most recent download from August 16 through August 06, 2023 confirms 100% compliance with average usage 7 hours 45 minutes.  At 15 cm set pressure AHI is 1.0.  She qualifies for a new machine and we will schedule her to obtain a new ResMed AirSense 11 auto unit with EPR of 3 and a pressure range of 15 to 20 cm.  Since her last evaluation with me, I performed cardiac catheterization in 2022 which revealed significant multivessel CAD including left main, ostial LAD ramus intermediate and circumflex vessel.  She subsequently underwent successful CABG revascularization surgery by Dr. Deloise Ferries on October 26, 2020.  She has had some issues with blood pressure being elevated leading to resumption of olmesartan  for several months and then with blood pressure lowering and dizziness she has not been taking this for the last 3 months.  There also is family history for abdominal aortic aneurysm in her father.  She underwent subsequent echo Doppler study as well as CT of her aorta and abdomen.  Echocardiography performed on September 15, 2023 showed EF 60 to 65% without wall motion abnormalities.  She had normal diastolic parameters and vitals are normal.  CT of her chest aorta and pelvis showed normal contour and caliber of thoracic and abdominal aorta without evidence for aneurysm or dissection.  She had severe aortic atherosclerosis.  She was status post median sternotomy with CABG.  There was hepatomegaly with coarse nodular contour of the liver suggestive of possible cirrhosis.  There was evidence for cholelithiasis and uterine fibroid.  Since I last saw her, she finally received a new ResMed AirSense 11 AutoSet unit with her  prior device being greater than 54 years old.  A download from 820 through March 10, 2024 shows 100% use with average use of 6 per night.  Her CPAP is set at a pressure range of 15 to 20 cm.  AHI is excellent at 1.5/h.  She feels well.  She is unaware of breakthrough snoring.  She has just completed a study with Mounjaro and she plans to be reverting back to Ozempic when she sees her provider.  She continues to see Dr. Felizardo Hotter for primary care.  Blood pressure today is stable on olmesartan  20 mg, metoprolol  12.5 mg which she has been taking daily.  She is on rosuvastatin  40 mg, Zetia  10 mg, fenofibrate  145 mg and over-the-counter fish oil  for lipid management.  She is diabetic on Jardiance in addition to metformin  and will be planning to resume Ozempic.  She is on Nexium  for GERD and takes Lexapro  depression.  She is aware of my imminent retirement.  I will transition her to the interventional care of Dr. Randene Bustard with initial evaluation in 6 to 8 months.  She will see Dr. Micael Adas as needed for future sleep evaluation.    Medication Adjustments/Labs and Tests Ordered: Current medicines are reviewed at length with the patient today.  Concerns regarding medicines are outlined above.  Medication changes, Labs and Tests ordered today are listed in the Patient Instructions below. Patient Instructions  Medication Instructions:  Your physician recommends that you continue on your current medications as directed. Please refer to the Current Medication list given to you today.  *If you need a refill on your cardiac medications before your next appointment, please call your pharmacy*  Lab Work: None ordered.  If you have labs (blood work) drawn today and your tests are completely normal, you will receive your results only by: MyChart Message (if you have MyChart) OR A paper copy in the mail If you have any lab test that is abnormal or we need to change your treatment, we will call you to review the  results.  Testing/Procedures: None ordered.   Follow-Up: At [facility], you and your health needs are our priority.  As part of our continuing mission to provide you with exceptional  heart care, our providers are all part of one team.  This team includes your primary Cardiologist (physician) and Advanced Practice Providers or APPs (Physician Assistants and Nurse Practitioners) who all work together to provide you with the care you need, when you need it.  Your next appointment:   6 months with Dr Addie Holstein  Follow up as needed with Dr Micael Adas for sleep       Signed, Magnus Schuller, MD  03/11/2024 4:53 PM    [facility] [address], [place], [place]  [address] Phone: [phone]

## 2024-03-24 DIAGNOSIS — G4733 Obstructive sleep apnea (adult) (pediatric): Secondary | ICD-10-CM | POA: Diagnosis not present

## 2024-04-24 DIAGNOSIS — G4733 Obstructive sleep apnea (adult) (pediatric): Secondary | ICD-10-CM | POA: Diagnosis not present

## 2024-05-25 DIAGNOSIS — G4733 Obstructive sleep apnea (adult) (pediatric): Secondary | ICD-10-CM | POA: Diagnosis not present

## 2024-06-11 DIAGNOSIS — M25541 Pain in joints of right hand: Secondary | ICD-10-CM | POA: Diagnosis not present

## 2024-06-11 DIAGNOSIS — M25531 Pain in right wrist: Secondary | ICD-10-CM | POA: Diagnosis not present

## 2024-06-24 DIAGNOSIS — L820 Inflamed seborrheic keratosis: Secondary | ICD-10-CM | POA: Diagnosis not present

## 2024-06-24 DIAGNOSIS — L821 Other seborrheic keratosis: Secondary | ICD-10-CM | POA: Diagnosis not present

## 2024-06-24 DIAGNOSIS — G4733 Obstructive sleep apnea (adult) (pediatric): Secondary | ICD-10-CM | POA: Diagnosis not present

## 2024-07-28 DIAGNOSIS — E1165 Type 2 diabetes mellitus with hyperglycemia: Secondary | ICD-10-CM | POA: Diagnosis not present

## 2024-07-28 DIAGNOSIS — E785 Hyperlipidemia, unspecified: Secondary | ICD-10-CM | POA: Diagnosis not present

## 2024-07-28 DIAGNOSIS — E039 Hypothyroidism, unspecified: Secondary | ICD-10-CM | POA: Diagnosis not present

## 2024-07-28 DIAGNOSIS — I10 Essential (primary) hypertension: Secondary | ICD-10-CM | POA: Diagnosis not present

## 2024-08-06 ENCOUNTER — Other Ambulatory Visit: Payer: Self-pay | Admitting: General Practice

## 2024-08-09 MED ORDER — EZETIMIBE 10 MG PO TABS: 10.0000 mg | ORAL_TABLET | Freq: Every day | ORAL | 0 refills | Status: AC

## 2024-08-16 DIAGNOSIS — M810 Age-related osteoporosis without current pathological fracture: Secondary | ICD-10-CM | POA: Diagnosis not present

## 2024-08-16 DIAGNOSIS — M1711 Unilateral primary osteoarthritis, right knee: Secondary | ICD-10-CM | POA: Diagnosis not present

## 2024-09-28 NOTE — Progress Notes (Signed)
 Sue Baker                                          MRN: [id number]   09/28/2024   The [facility] Quality Team Specialist reviewed this patient medical record for the purposes of chart review for care gap closure. The following were reviewed: abstraction for care gap closure-controlling blood pressure.    [facility] Quality Team
